# Patient Record
Sex: Male | Born: 1940 | Race: White | Hispanic: No | Marital: Single | Smoking: Never smoker
Health system: Southern US, Community
[De-identification: ages and names within clinical notes are randomized; demographics above are authoritative.]

## PROBLEM LIST (undated history)

## (undated) DIAGNOSIS — IMO0002 Reserved for concepts with insufficient information to code with codable children: Secondary | ICD-10-CM

## (undated) DIAGNOSIS — N400 Enlarged prostate without lower urinary tract symptoms: Secondary | ICD-10-CM

## (undated) DIAGNOSIS — F411 Generalized anxiety disorder: Secondary | ICD-10-CM

## (undated) DIAGNOSIS — I421 Obstructive hypertrophic cardiomyopathy: Secondary | ICD-10-CM

## (undated) DIAGNOSIS — E1165 Type 2 diabetes mellitus with hyperglycemia: Secondary | ICD-10-CM

## (undated) DIAGNOSIS — I10 Essential (primary) hypertension: Secondary | ICD-10-CM

## (undated) DIAGNOSIS — I255 Ischemic cardiomyopathy: Secondary | ICD-10-CM

## (undated) DIAGNOSIS — G629 Polyneuropathy, unspecified: Secondary | ICD-10-CM

## (undated) DIAGNOSIS — E1149 Type 2 diabetes mellitus with other diabetic neurological complication: Secondary | ICD-10-CM

## (undated) DIAGNOSIS — E785 Hyperlipidemia, unspecified: Secondary | ICD-10-CM

## (undated) DIAGNOSIS — I2510 Atherosclerotic heart disease of native coronary artery without angina pectoris: Secondary | ICD-10-CM

## (undated) DIAGNOSIS — E538 Deficiency of other specified B group vitamins: Secondary | ICD-10-CM

## (undated) HISTORY — DX: Obstructive hypertrophic cardiomyopathy: I42.1

## (undated) HISTORY — DX: Essential (primary) hypertension: I10

## (undated) HISTORY — DX: Atherosclerotic heart disease of native coronary artery without angina pectoris: I25.10

## (undated) HISTORY — DX: Type 2 diabetes mellitus with other diabetic neurological complication: E11.49

## (undated) HISTORY — DX: Type 2 diabetes mellitus with hyperglycemia: E11.65

## (undated) HISTORY — DX: Polyneuropathy, unspecified: G62.9

## (undated) HISTORY — DX: Hyperlipidemia, unspecified: E78.5

## (undated) HISTORY — DX: Generalized anxiety disorder: F41.1

## (undated) HISTORY — DX: Reserved for concepts with insufficient information to code with codable children: IMO0002

## (undated) HISTORY — DX: Benign prostatic hyperplasia without lower urinary tract symptoms: N40.0

## (undated) HISTORY — DX: Ischemic cardiomyopathy: I25.5

## (undated) HISTORY — DX: Deficiency of other specified B group vitamins: E53.8

---

## 2008-11-27 ENCOUNTER — Emergency Department (HOSPITAL_COMMUNITY): Admission: EM | Admit: 2008-11-27 | Discharge: 2008-11-27 | Payer: Self-pay | Admitting: Emergency Medicine

## 2010-12-30 LAB — POCT CARDIAC MARKERS
CKMB, poc: 1.7 ng/mL (ref 1.0–8.0)
Myoglobin, poc: 126 ng/mL (ref 12–200)

## 2010-12-30 LAB — COMPREHENSIVE METABOLIC PANEL
ALT: 30 U/L (ref 0–53)
AST: 20 U/L (ref 0–37)
Albumin: 4.4 g/dL (ref 3.5–5.2)
Alkaline Phosphatase: 58 U/L (ref 39–117)
Chloride: 105 mEq/L (ref 96–112)
GFR calc Af Amer: >60 mL/min (ref >60)
Potassium: 3.6 mEq/L (ref 3.5–5.1)
Sodium: 138 mEq/L (ref 135–145)
Total Bilirubin: 0.7 mg/dL (ref 0.3–1.2)

## 2010-12-30 LAB — URINE MICROSCOPIC-ADD ON

## 2010-12-30 LAB — DIFFERENTIAL
Basophils Absolute: 0 10*3/uL (ref 0.0–0.1)
Basophils Relative: 0 % (ref 0–1)
Eosinophils Relative: 0 % (ref 0–5)
Monocytes Absolute: 0.5 10*3/uL (ref 0.1–1.0)
Monocytes Relative: 5 % (ref 3–12)

## 2010-12-30 LAB — URINALYSIS, ROUTINE W REFLEX MICROSCOPIC
Glucose, UA: 250 mg/dL — AB
Hgb urine dipstick: NEGATIVE
Protein, ur: 30 mg/dL — AB
Urobilinogen, UA: 0.2 mg/dL (ref 0.0–1.0)

## 2010-12-30 LAB — CBC
HCT: 45 % (ref 39.0–52.0)
Hemoglobin: 15.5 g/dL (ref 13.0–17.0)
RBC: 5.01 MIL/uL (ref 4.22–5.81)
RDW: 13 % (ref 11.5–15.5)
WBC: 9.5 10*3/uL (ref 4.0–10.5)

## 2010-12-30 LAB — CK TOTAL AND CKMB (NOT AT ARMC): CK, MB: 3.3 ng/mL (ref 0.3–4.0)

## 2019-06-05 ENCOUNTER — Other Ambulatory Visit: Payer: Self-pay

## 2019-06-05 ENCOUNTER — Ambulatory Visit (INDEPENDENT_AMBULATORY_CARE_PROVIDER_SITE_OTHER): Payer: Medicare Other | Admitting: Internal Medicine

## 2019-06-05 ENCOUNTER — Encounter: Payer: Self-pay | Admitting: Internal Medicine

## 2019-06-05 VITALS — BP 140/80 | HR 70 | Temp 98.0°F | Ht 74.0 in | Wt 205.8 lb

## 2019-06-05 DIAGNOSIS — I10 Essential (primary) hypertension: Secondary | ICD-10-CM | POA: Diagnosis not present

## 2019-06-05 DIAGNOSIS — N400 Enlarged prostate without lower urinary tract symptoms: Secondary | ICD-10-CM | POA: Insufficient documentation

## 2019-06-05 DIAGNOSIS — E785 Hyperlipidemia, unspecified: Secondary | ICD-10-CM

## 2019-06-05 DIAGNOSIS — F411 Generalized anxiety disorder: Secondary | ICD-10-CM

## 2019-06-05 DIAGNOSIS — G609 Hereditary and idiopathic neuropathy, unspecified: Secondary | ICD-10-CM

## 2019-06-05 DIAGNOSIS — E1165 Type 2 diabetes mellitus with hyperglycemia: Secondary | ICD-10-CM

## 2019-06-05 DIAGNOSIS — IMO0002 Reserved for concepts with insufficient information to code with codable children: Secondary | ICD-10-CM

## 2019-06-05 DIAGNOSIS — G629 Polyneuropathy, unspecified: Secondary | ICD-10-CM | POA: Insufficient documentation

## 2019-06-05 DIAGNOSIS — E1149 Type 2 diabetes mellitus with other diabetic neurological complication: Secondary | ICD-10-CM | POA: Diagnosis not present

## 2019-06-05 DIAGNOSIS — E119 Type 2 diabetes mellitus without complications: Secondary | ICD-10-CM

## 2019-06-05 DIAGNOSIS — E538 Deficiency of other specified B group vitamins: Secondary | ICD-10-CM | POA: Diagnosis not present

## 2019-06-05 DIAGNOSIS — N401 Enlarged prostate with lower urinary tract symptoms: Secondary | ICD-10-CM

## 2019-06-05 DIAGNOSIS — R351 Nocturia: Secondary | ICD-10-CM

## 2019-06-05 LAB — POCT GLYCOSYLATED HEMOGLOBIN (HGB A1C): Hemoglobin A1C: 9.7 % — AB (ref 4.0–5.6)

## 2019-06-05 MED ORDER — LISINOPRIL-HYDROCHLOROTHIAZIDE 20-25 MG PO TABS: 1.0000 | ORAL_TABLET | Freq: Every day | ORAL | 1 refills | Status: DC

## 2019-06-05 MED ORDER — "BD SAFETYGLIDE SYRINGE/NEEDLE 25G X 1"" 3 ML MISC": 11 refills | Status: DC

## 2019-06-05 MED ORDER — GLIMEPIRIDE 2 MG PO TABS: 2.0000 mg | ORAL_TABLET | Freq: Two times a day (BID) | ORAL | 1 refills | Status: DC

## 2019-06-05 MED ORDER — CYANOCOBALAMIN 1000 MCG/ML IJ SOLN: 1000.0000 ug | INTRAMUSCULAR | 1 refills | Status: DC

## 2019-06-05 MED ORDER — TAMSULOSIN HCL 0.4 MG PO CAPS: 0.4000 mg | ORAL_CAPSULE | Freq: Every day | ORAL | 1 refills | Status: DC

## 2019-06-05 MED ORDER — ATORVASTATIN CALCIUM 40 MG PO TABS: 40.0000 mg | ORAL_TABLET | Freq: Every day | ORAL | 1 refills | Status: DC

## 2019-06-05 MED ORDER — GABAPENTIN 300 MG PO CAPS: 300.0000 mg | ORAL_CAPSULE | Freq: Three times a day (TID) | ORAL | 3 refills | Status: DC

## 2019-06-05 MED ORDER — CYANOCOBALAMIN 1000 MCG/ML IJ SOLN
1000.0000 ug | Freq: Once | INTRAMUSCULAR | Status: AC
  Administered 2019-06-05: 11:00:00 1000 ug via INTRAMUSCULAR

## 2019-06-05 MED ORDER — METFORMIN HCL 1000 MG PO TABS: 1000.0000 mg | ORAL_TABLET | Freq: Two times a day (BID) | ORAL | 1 refills | Status: DC

## 2019-06-05 MED ORDER — PAROXETINE HCL 40 MG PO TABS: 40.0000 mg | ORAL_TABLET | ORAL | 1 refills | Status: DC

## 2019-06-05 NOTE — Patient Instructions (Signed)
-Nice seeing you today!!  -Refills sent today.  -INCREASE metformin to 1000 mg BID.  -Schedule follow up in 3 months for your physical. Please come in fasting that day.   Diabetes Mellitus and Nutrition, Adult When you have diabetes (diabetes mellitus), it is very important to have healthy eating habits because your blood sugar (glucose) levels are greatly affected by what you eat and drink. Eating healthy foods in the appropriate amounts, at about the same times every day, can help you:  Control your blood glucose.  Lower your risk of heart disease.  Improve your blood pressure.  Reach or maintain a healthy weight. Every person with diabetes is different, and each person has different needs for a meal plan. Your health care provider may recommend that you work with a diet and nutrition specialist (dietitian) to make a meal plan that is best for you. Your meal plan may vary depending on factors such as:  The calories you need.  The medicines you take.  Your weight.  Your blood glucose, blood pressure, and cholesterol levels.  Your activity level.  Other health conditions you have, such as heart or kidney disease. How do carbohydrates affect me? Carbohydrates, also called carbs, affect your blood glucose level more than any other type of food. Eating carbs naturally raises the amount of glucose in your blood. Carb counting is a method for keeping track of how many carbs you eat. Counting carbs is important to keep your blood glucose at a healthy level, especially if you use insulin or take certain oral diabetes medicines. It is important to know how many carbs you can safely have in each meal. This is different for every person. Your dietitian can help you calculate how many carbs you should have at each meal and for each snack. Foods that contain carbs include:  Bread, cereal, rice, pasta, and crackers.  Potatoes and corn.  Peas, beans, and lentils.  Milk and yogurt.   Fruit and juice.  Desserts, such as cakes, cookies, ice cream, and candy. How does alcohol affect me? Alcohol can cause a sudden decrease in blood glucose (hypoglycemia), especially if you use insulin or take certain oral diabetes medicines. Hypoglycemia can be a life-threatening condition. Symptoms of hypoglycemia (sleepiness, dizziness, and confusion) are similar to symptoms of having too much alcohol. If your health care provider says that alcohol is safe for you, follow these guidelines:  Limit alcohol intake to no more than 1 drink per day for nonpregnant women and 2 drinks per day for men. One drink equals 12 oz of beer, 5 oz of wine, or 1 oz of hard liquor.  Do not drink on an empty stomach.  Keep yourself hydrated with water, diet soda, or unsweetened iced tea.  Keep in mind that regular soda, juice, and other mixers may contain a lot of sugar and must be counted as carbs. What are tips for following this plan?  Reading food labels  Start by checking the serving size on the "Nutrition Facts" label of packaged foods and drinks. The amount of calories, carbs, fats, and other nutrients listed on the label is based on one serving of the item. Many items contain more than one serving per package.  Check the total grams (g) of carbs in one serving. You can calculate the number of servings of carbs in one serving by dividing the total carbs by 15. For example, if a food has 30 g of total carbs, it would be equal to 2 servings  of carbs.  Check the number of grams (g) of saturated and trans fats in one serving. Choose foods that have low or no amount of these fats.  Check the number of milligrams (mg) of salt (sodium) in one serving. Most people should limit total sodium intake to less than 2,300 mg per day.  Always check the nutrition information of foods labeled as "low-fat" or "nonfat". These foods may be higher in added sugar or refined carbs and should be avoided.  Talk to your  dietitian to identify your daily goals for nutrients listed on the label. Shopping  Avoid buying canned, premade, or processed foods. These foods tend to be high in fat, sodium, and added sugar.  Shop around the outside edge of the grocery store. This includes fresh fruits and vegetables, bulk grains, fresh meats, and fresh dairy. Cooking  Use low-heat cooking methods, such as baking, instead of high-heat cooking methods like deep frying.  Cook using healthy oils, such as olive, canola, or sunflower oil.  Avoid cooking with butter, cream, or high-fat meats. Meal planning  Eat meals and snacks regularly, preferably at the same times every day. Avoid going long periods of time without eating.  Eat foods high in fiber, such as fresh fruits, vegetables, beans, and whole grains. Talk to your dietitian about how many servings of carbs you can eat at each meal.  Eat 4-6 ounces (oz) of lean protein each day, such as lean meat, chicken, fish, eggs, or tofu. One oz of lean protein is equal to: ? 1 oz of meat, chicken, or fish. ? 1 egg. ?  cup of tofu.  Eat some foods each day that contain healthy fats, such as avocado, nuts, seeds, and fish. Lifestyle  Check your blood glucose regularly.  Exercise regularly as told by your health care provider. This may include: ? 150 minutes of moderate-intensity or vigorous-intensity exercise each week. This could be brisk walking, biking, or water aerobics. ? Stretching and doing strength exercises, such as yoga or weightlifting, at least 2 times a week.  Take medicines as told by your health care provider.  Do not use any products that contain nicotine or tobacco, such as cigarettes and e-cigarettes. If you need help quitting, ask your health care provider.  Work with a Social worker or diabetes educator to identify strategies to manage stress and any emotional and social challenges. Questions to ask a health care provider  Do I need to meet with a  diabetes educator?  Do I need to meet with a dietitian?  What number can I call if I have questions?  When are the best times to check my blood glucose? Where to find more information:  American Diabetes Association: diabetes.org  Academy of Nutrition and Dietetics: www.eatright.[employer] of Diabetes and Digestive and Kidney Diseases (NIH): [employer] Summary  A healthy meal plan will help you control your blood glucose and maintain a healthy lifestyle.  Working with a diet and nutrition specialist (dietitian) can help you make a meal plan that is best for you.  Keep in mind that carbohydrates (carbs) and alcohol have immediate effects on your blood glucose levels. It is important to count carbs and to use alcohol carefully. This information is not intended to replace advice given to you by your health care provider. Make sure you discuss any questions you have with your health care provider. Document Released: 06/02/2005 Document Revised: 08/18/2017 Document Reviewed: 10/10/2016 Elsevier Patient Education  2020 [employer].

## 2019-06-05 NOTE — Progress Notes (Signed)
New Patient Office Visit     CC/Reason for Visit: Establish care, discuss chronic conditions, medication refills [facility] [place] [facility] [place]: [facility]  HPI: Ryan [name] is a 78 y.o. male who is coming in today for the above mentioned reasons. 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  [place], has 2 children, his middle son was killed in a car accident in 2012.  He is a never smoker, does not drink alcohol.  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  Used to be a Landscape architectpro tennis player.  His family history is only significant for mother with diabetes, his daughter has type 1 diabetes.  He ran out of all of his medications about a month ago.   Past Medical/Surgical History: Past Medical History:  Diagnosis Date  . B12 deficiency   . BPH (benign prostatic hyperplasia)   . DM (diabetes mellitus), type 2, uncontrolled w/neurologic complication (HCC)   . GAD (generalized anxiety disorder)   . HTN (hypertension)   . Hyperlipidemia   . Neuropathy, peripheral     History reviewed. No pertinent surgical history.  Social History:  reports that he has never smoked. He has never used smokeless tobacco. He reports that he does not drink alcohol or use drugs.  Allergies: Not on File  Family History:  Family History  Problem Relation Age of Onset  . Diabetes Mother      Current Outpatient Medications:  .  atorvastatin (LIPITOR) 40 MG tablet, Take 1 tablet (40 mg total) by mouth at bedtime., Disp: 90 tablet, Rfl: 1 .  gabapentin (NEURONTIN) 300 MG capsule, Take 1 capsule (300 mg total) by mouth 3 (three) times daily., Disp: 90 capsule, Rfl: 3 .  glimepiride (AMARYL) 2 MG tablet, Take 1 tablet (2 mg total) by mouth 2 (two) times daily., Disp: 180 tablet,  Rfl: 1 .  lisinopril-hydrochlorothiazide (ZESTORETIC) 20-25 MG tablet, Take 1 tablet by mouth daily. Take one tab daily, Disp: 90 tablet, Rfl: 1 .  metFORMIN (GLUCOPHAGE) 1000 MG tablet, Take 1 tablet (1,000 mg total) by mouth 2 (two) times daily with a meal. Take one tab twice daily, Disp: 180 tablet, Rfl: 1 .  PARoxetine (PAXIL) 40 MG tablet, Take 1 tablet (40 mg total) by mouth every morning., Disp: 90 tablet, Rfl: 1 .  tamsulosin (FLOMAX) 0.4 MG CAPS capsule, Take 1 capsule (0.4 mg total) by mouth daily., Disp: 90 capsule, Rfl: 1  Review of Systems:  Constitutional: Denies fever, chills, diaphoresis, appetite change and fatigue.  HEENT: Denies photophobia, eye pain, redness, hearing loss, ear pain, congestion, sore throat, rhinorrhea, sneezing, mouth sores, trouble swallowing, neck pain, neck stiffness and tinnitus.   Respiratory: Denies SOB, DOE, cough, chest tightness,  and wheezing.   Cardiovascular: Denies chest pain, palpitations and leg swelling.  Gastrointestinal: Denies nausea, vomiting, abdominal pain, diarrhea, constipation, blood in stool and abdominal distention.  Genitourinary: Denies dysuria, urgency, frequency, hematuria, flank pain and difficulty urinating.  Endocrine: Denies: hot or cold intolerance, sweats, changes in hair or nails, polyuria, polydipsia. Musculoskeletal: Denies myalgias, back pain, joint swelling, arthralgias and gait problem.  Skin: Denies pallor, rash and wound.  Neurological: Denies dizziness, seizures, syncope, weakness, light-headedness, numbness and headaches.  Hematological: Denies adenopathy. Easy bruising, personal or family bleeding history  Psychiatric/Behavioral: Denies  suicidal ideation, mood changes, confusion, nervousness, sleep disturbance and agitation    Physical Exam: Vitals:   06/05/19 0955  BP: 140/80  Pulse: 70  Temp: 98 F (36.7 C)  TempSrc: Temporal  SpO2: 99%  Weight: 205 lb 12.8 oz (93.4 kg)  Height: 6\' 2"  (1.88 m)    Body mass index is 26.42 kg/m.  Constitutional: NAD, calm, comfortable Eyes: PERRL, lids and conjunctivae normal ENMT: Mucous membranes are moist. Respiratory: clear to auscultation bilaterally, no wheezing, no crackles. Normal respiratory effort. No accessory muscle use.  Cardiovascular: Regular rate and rhythm, no murmurs / rubs / gallops. No extremity edema. 2+ pedal pulses. No carotid bruits.  Abdomen: no tenderness, no masses palpated. No hepatosplenomegaly. Bowel sounds positive.  Musculoskeletal: no clubbing / cyanosis. No joint deformity upper and lower extremities. Good ROM, no contractures. Normal muscle tone.  Skin: no rashes, lesions, ulcers. No induration Neurologic: Grossly intact and nonfocal Psychiatric: Normal judgment and insight. Alert and oriented x 3. Normal mood.    Impression and Plan:  Type 2 diabetes mellitus without complication, without long-term current use of insulin (HCC)  -Uncontrolled with an A1c of 9.7 today. -He has been taking metformin 500 mg twice daily, will increase to 1000 mg twice daily. -Follow-up in 3 months. -He is also on Amaryl  Essential hypertension -Not well controlled but has been off medications for about 1 month, resume lisinopril/hydrochlorothiazide. -Follow-up in 3 months.  Idiopathic peripheral neuropathy -Refill Neurontin 300 mg of which she takes 2 tablets at bedtime.  Benign prostatic hyperplasia with nocturia -Needs refills of Flomax.  B12 deficiency -He is on monthly IM supplementation  GAD (generalized anxiety disorder) -Mood is stable on Paxil, he started taking this after the tragic death of his son.  Hyperlipidemia, unspecified hyperlipidemia type -Not on a statin, check lipids when he returns for physical    Patient Instructions  -Nice seeing you today!!  -Refills sent today.  -INCREASE metformin to 1000 mg BID.  -Schedule follow up in 3 months for your physical. Please come in fasting that day.    Diabetes Mellitus and Nutrition, Adult When you have diabetes (diabetes mellitus), it is very important to have healthy eating habits because your blood sugar (glucose) levels are greatly affected by what you eat and drink. Eating healthy foods in the appropriate amounts, at about the same times every day, can help you:  Control your blood glucose.  Lower your risk of heart disease.  Improve your blood pressure.  Reach or maintain a healthy weight. Every person with diabetes is different, and each person has different needs for a meal plan. Your health care provider may recommend that you work with a diet and nutrition specialist (dietitian) to make a meal plan that is best for you. Your meal plan may vary depending on factors such as:  The calories you need.  The medicines you take.  Your weight.  Your blood glucose, blood pressure, and cholesterol levels.  Your activity level.  Other health conditions you have, such as heart or kidney disease. How do carbohydrates affect me? Carbohydrates, also called carbs, affect your blood glucose level more than any other type of food. Eating carbs naturally raises the amount of glucose in your blood. Carb counting is a method for keeping track of how many carbs you eat. Counting carbs is important to keep your blood glucose at a healthy level, especially if you use insulin or take certain oral diabetes medicines. It is important to know how many  carbs you can safely have in each meal. This is different for every person. Your dietitian can help you calculate how many carbs you should have at each meal and for each snack. Foods that contain carbs include:  Bread, cereal, rice, pasta, and crackers.  Potatoes and corn.  Peas, beans, and lentils.  Milk and yogurt.  Fruit and juice.  Desserts, such as cakes, cookies, ice cream, and candy. How does alcohol affect me? Alcohol can cause a sudden decrease in blood glucose (hypoglycemia),  especially if you use insulin or take certain oral diabetes medicines. Hypoglycemia can be a life-threatening condition. Symptoms of hypoglycemia (sleepiness, dizziness, and confusion) are similar to symptoms of having too much alcohol. If your health care provider says that alcohol is safe for you, follow these guidelines:  Limit alcohol intake to no more than 1 drink per day for nonpregnant women and 2 drinks per day for men. One drink equals 12 oz of beer, 5 oz of wine, or 1 oz of hard liquor.  Do not drink on an empty stomach.  Keep yourself hydrated with water, diet soda, or unsweetened iced tea.  Keep in mind that regular soda, juice, and other mixers may contain a lot of sugar and must be counted as carbs. What are tips for following this plan?  Reading food labels  Start by checking the serving size on the "Nutrition Facts" label of packaged foods and drinks. The amount of calories, carbs, fats, and other nutrients listed on the label is based on one serving of the item. Many items contain more than one serving per package.  Check the total grams (g) of carbs in one serving. You can calculate the number of servings of carbs in one serving by dividing the total carbs by 15. For example, if a food has 30 g of total carbs, it would be equal to 2 servings of carbs.  Check the number of grams (g) of saturated and trans fats in one serving. Choose foods that have low or no amount of these fats.  Check the number of milligrams (mg) of salt (sodium) in one serving. Most people should limit total sodium intake to less than 2,300 mg per day.  Always check the nutrition information of foods labeled as "low-fat" or "nonfat". These foods may be higher in added sugar or refined carbs and should be avoided.  Talk to your dietitian to identify your daily goals for nutrients listed on the label. Shopping  Avoid buying canned, premade, or processed foods. These foods tend to be high in fat, sodium,  and added sugar.  Shop around the outside edge of the grocery store. This includes fresh fruits and vegetables, bulk grains, fresh meats, and fresh dairy. Cooking  Use low-heat cooking methods, such as baking, instead of high-heat cooking methods like deep frying.  Cook using healthy oils, such as olive, canola, or sunflower oil.  Avoid cooking with butter, cream, or high-fat meats. Meal planning  Eat meals and snacks regularly, preferably at the same times every day. Avoid going long periods of time without eating.  Eat foods high in fiber, such as fresh fruits, vegetables, beans, and whole grains. Talk to your dietitian about how many servings of carbs you can eat at each meal.  Eat 4-6 ounces (oz) of lean protein each day, such as lean meat, chicken, fish, eggs, or tofu. One oz of lean protein is equal to: ? 1 oz of meat, chicken, or fish. ? 1 egg. ?  cup of tofu.  Eat some foods each day that contain healthy fats, such as avocado, nuts, seeds, and fish. Lifestyle  Check your blood glucose regularly.  Exercise regularly as told by your health care provider. This may include: ? 150 minutes of moderate-intensity or vigorous-intensity exercise each week. This could be brisk walking, biking, or water aerobics. ? Stretching and doing strength exercises, such as yoga or weightlifting, at least 2 times a week.  Take medicines as told by your health care provider.  Do not use any products that contain nicotine or tobacco, such as cigarettes and e-cigarettes. If you need help quitting, ask your health care provider.  Work with a Veterinary surgeoncounselor or diabetes educator to identify strategies to manage stress and any emotional and social challenges. Questions to ask a health care provider  Do I need to meet with a diabetes educator?  Do I need to meet with a dietitian?  What number can I call if I have questions?  When are the best times to check my blood glucose? Where to find more  information:  American Diabetes Association: diabetes.org  Academy of Nutrition and Dietetics: www.eatright.[employer]  National Institute of Diabetes and Digestive and Kidney Diseases (NIH): [employer].tnwww.niddk.nih.gov Summary  A healthy meal plan will help you control your blood glucose and maintain a healthy lifestyle.  Working with a diet and nutrition specialist (dietitian) can help you make a meal plan that is best for you.  Keep in mind that carbohydrates (carbs) and alcohol have immediate effects on your blood glucose levels. It is important to count carbs and to use alcohol carefully. This information is not intended to replace advice given to you by your health care provider. Make sure you discuss any questions you have with your health care provider. Document Released: 06/02/2005 Document Revised: 08/18/2017 Document Reviewed: 10/10/2016 Elsevier Patient Education  2020 Elsevier Inc.      Chaya JanEstela Hernandez Acosta, MD [facility]

## 2019-06-05 NOTE — Addendum Note (Signed)
Addended by: Westley Hummer B on: 06/05/2019 10:51 AM   Modules accepted: Orders

## 2019-10-08 ENCOUNTER — Encounter: Payer: Self-pay | Admitting: Internal Medicine

## 2019-12-17 ENCOUNTER — Other Ambulatory Visit: Payer: Self-pay | Admitting: Internal Medicine

## 2019-12-17 DIAGNOSIS — I10 Essential (primary) hypertension: Secondary | ICD-10-CM

## 2019-12-17 DIAGNOSIS — G609 Hereditary and idiopathic neuropathy, unspecified: Secondary | ICD-10-CM

## 2019-12-17 DIAGNOSIS — E119 Type 2 diabetes mellitus without complications: Secondary | ICD-10-CM

## 2019-12-25 ENCOUNTER — Telehealth: Payer: Self-pay | Admitting: Internal Medicine

## 2019-12-25 DIAGNOSIS — M25562 Pain in left knee: Secondary | ICD-10-CM

## 2019-12-25 NOTE — Telephone Encounter (Signed)
Left message on machine for patient to return our call 

## 2019-12-25 NOTE — Telephone Encounter (Signed)
We do not do knee injections here. Please refer to sports med or ortho per his preference.

## 2019-12-25 NOTE — Telephone Encounter (Signed)
Pt is calling in stating that he would like to come in and get a cortisone injection in his L knee sometime this week is he could come and do it as a nurse visit.  Pt is aware that Dr. Ardyth Harps does not have anything tomorrow or Friday but virtual appointments.  Pt would like to have a call back to let him know if this can be done without seeing the provider.

## 2019-12-31 NOTE — Telephone Encounter (Signed)
Patient is aware and a referral to sports med placed.

## 2019-12-31 NOTE — Addendum Note (Signed)
Addended by: Kern Reap B on: 12/31/2019 05:00 PM   Modules accepted: Orders

## 2019-12-31 NOTE — Telephone Encounter (Signed)
2nd attempt. Left message on machine for patient to return our call. 

## 2020-01-06 ENCOUNTER — Other Ambulatory Visit: Payer: Self-pay

## 2020-01-07 ENCOUNTER — Encounter: Payer: Self-pay | Admitting: Internal Medicine

## 2020-01-07 ENCOUNTER — Other Ambulatory Visit: Payer: Self-pay | Admitting: Internal Medicine

## 2020-01-07 ENCOUNTER — Ambulatory Visit (INDEPENDENT_AMBULATORY_CARE_PROVIDER_SITE_OTHER): Payer: Medicare Other | Admitting: Internal Medicine

## 2020-01-07 VITALS — BP 134/80 | HR 78 | Temp 97.4°F | Ht 73.0 in | Wt 198.9 lb

## 2020-01-07 DIAGNOSIS — F411 Generalized anxiety disorder: Secondary | ICD-10-CM

## 2020-01-07 DIAGNOSIS — E538 Deficiency of other specified B group vitamins: Secondary | ICD-10-CM | POA: Diagnosis not present

## 2020-01-07 DIAGNOSIS — Z0000 Encounter for general adult medical examination without abnormal findings: Secondary | ICD-10-CM

## 2020-01-07 DIAGNOSIS — IMO0002 Reserved for concepts with insufficient information to code with codable children: Secondary | ICD-10-CM

## 2020-01-07 DIAGNOSIS — E785 Hyperlipidemia, unspecified: Secondary | ICD-10-CM

## 2020-01-07 DIAGNOSIS — E559 Vitamin D deficiency, unspecified: Secondary | ICD-10-CM

## 2020-01-07 DIAGNOSIS — E1165 Type 2 diabetes mellitus with hyperglycemia: Secondary | ICD-10-CM

## 2020-01-07 DIAGNOSIS — I10 Essential (primary) hypertension: Secondary | ICD-10-CM

## 2020-01-07 DIAGNOSIS — E1149 Type 2 diabetes mellitus with other diabetic neurological complication: Secondary | ICD-10-CM | POA: Diagnosis not present

## 2020-01-07 DIAGNOSIS — G4700 Insomnia, unspecified: Secondary | ICD-10-CM

## 2020-01-07 DIAGNOSIS — Z125 Encounter for screening for malignant neoplasm of prostate: Secondary | ICD-10-CM | POA: Diagnosis not present

## 2020-01-07 DIAGNOSIS — M25562 Pain in left knee: Secondary | ICD-10-CM

## 2020-01-07 LAB — CBC WITH DIFFERENTIAL/PLATELET
Basophils Absolute: 0.1 10*3/uL (ref 0.0–0.1)
Basophils Relative: 1.2 % (ref 0.0–3.0)
Eosinophils Absolute: 0.1 10*3/uL (ref 0.0–0.7)
Eosinophils Relative: 1.8 % (ref 0.0–5.0)
HCT: 40.1 % (ref 39.0–52.0)
Hemoglobin: 13.9 g/dL (ref 13.0–17.0)
Lymphocytes Relative: 23.3 % (ref 12.0–46.0)
Lymphs Abs: 1.5 10*3/uL (ref 0.7–4.0)
MCHC: 34.6 g/dL (ref 30.0–36.0)
MCV: 87 fl (ref 78.0–100.0)
Monocytes Absolute: 0.5 10*3/uL (ref 0.1–1.0)
Monocytes Relative: 8.4 % (ref 3.0–12.0)
Neutro Abs: 4.1 10*3/uL (ref 1.4–7.7)
Neutrophils Relative %: 65.3 % (ref 43.0–77.0)
Platelets: 244 10*3/uL (ref 150.0–400.0)
RBC: 4.61 Mil/uL (ref 4.22–5.81)
RDW: 13.9 % (ref 11.5–15.5)
WBC: 6.3 10*3/uL (ref 4.0–10.5)

## 2020-01-07 LAB — COMPREHENSIVE METABOLIC PANEL
ALT: 16 U/L (ref 0–53)
AST: 13 U/L (ref 0–37)
Albumin: 4.3 g/dL (ref 3.5–5.2)
Alkaline Phosphatase: 71 U/L (ref 39–117)
BUN: 14 mg/dL (ref 6–23)
CO2: 29 mEq/L (ref 19–32)
Calcium: 9.1 mg/dL (ref 8.4–10.5)
Chloride: 98 mEq/L (ref 96–112)
Creatinine, Ser: 1 mg/dL (ref 0.40–1.50)
GFR: 72.56 mL/min (ref >60.00)
Glucose, Bld: 325 mg/dL — ABNORMAL HIGH (ref 70–99)
Potassium: 3.6 mEq/L (ref 3.5–5.1)
Sodium: 135 mEq/L (ref 135–145)
Total Bilirubin: 0.6 mg/dL (ref 0.2–1.2)
Total Protein: 6.4 g/dL (ref 6.0–8.3)

## 2020-01-07 LAB — LIPID PANEL
Cholesterol: 129 mg/dL (ref 0–200)
HDL: 30 mg/dL — ABNORMAL LOW (ref >39.00)
NonHDL: 98.53
Total CHOL/HDL Ratio: 4
Triglycerides: 284 mg/dL — ABNORMAL HIGH (ref 0.0–149.0)
VLDL: 56.8 mg/dL — ABNORMAL HIGH (ref 0.0–40.0)

## 2020-01-07 LAB — VITAMIN D 25 HYDROXY (VIT D DEFICIENCY, FRACTURES): VITD: 14.54 ng/mL — ABNORMAL LOW (ref 30.00–100.00)

## 2020-01-07 LAB — VITAMIN B12: Vitamin B-12: >1500 pg/mL — ABNORMAL HIGH (ref 211–911)

## 2020-01-07 LAB — LDL CHOLESTEROL, DIRECT: Direct LDL: 56 mg/dL

## 2020-01-07 LAB — PSA: PSA: 1.17 ng/mL (ref 0.10–4.00)

## 2020-01-07 LAB — TSH: TSH: 1.46 u[IU]/mL (ref 0.35–4.50)

## 2020-01-07 LAB — HEMOGLOBIN A1C: Hgb A1c MFr Bld: 10.8 % — ABNORMAL HIGH (ref 4.6–6.5)

## 2020-01-07 MED ORDER — ZOLPIDEM TARTRATE 5 MG PO TABS: 5.0000 mg | ORAL_TABLET | Freq: Every evening | ORAL | 1 refills | Status: DC | PRN

## 2020-01-07 MED ORDER — TRULICITY 0.75 MG/0.5ML ~~LOC~~ SOAJ: 0.7500 mg | SUBCUTANEOUS | 3 refills | Status: DC

## 2020-01-07 MED ORDER — TRAMADOL HCL 50 MG PO TABS: 50.0000 mg | ORAL_TABLET | Freq: Two times a day (BID) | ORAL | 0 refills | Status: DC | PRN

## 2020-01-07 MED ORDER — CYANOCOBALAMIN 1000 MCG/ML IJ SOLN: INTRAMUSCULAR | 3 refills | Status: DC

## 2020-01-07 MED ORDER — "BD SAFETYGLIDE SYRINGE/NEEDLE 25G X 1"" 3 ML MISC": 11 refills | Status: DC

## 2020-01-07 MED ORDER — GLIMEPIRIDE 2 MG PO TABS: 2.0000 mg | ORAL_TABLET | Freq: Two times a day (BID) | ORAL | 1 refills | Status: DC

## 2020-01-07 MED ORDER — CYANOCOBALAMIN 1000 MCG/ML IJ SOLN
1000.0000 ug | Freq: Once | INTRAMUSCULAR | Status: AC
  Administered 2020-01-07: 1000 ug via INTRAMUSCULAR

## 2020-01-07 MED ORDER — VITAMIN D (ERGOCALCIFEROL) 1.25 MG (50000 UNIT) PO CAPS: 50000.0000 [IU] | ORAL_CAPSULE | ORAL | 0 refills | Status: AC

## 2020-01-07 NOTE — Addendum Note (Signed)
Addended by: Kern Reap B on: 01/07/2020 03:36 PM   Modules accepted: Orders

## 2020-01-07 NOTE — Progress Notes (Signed)
Established Patient Office Visit     This visit occurred during the SARS-CoV-2 public health emergency.  Safety protocols were in place, including screening questions prior to the visit, additional usage of staff PPE, and extensive cleaning of exam room while observing appropriate contact time as indicated for disinfecting solutions.    CC/Reason for Visit: Annual preventive exam, subsequent Medicare wellness visit, discuss some acute concerns  HPI: Ryan [name] is a 79 y.o. male who is coming in today for the above mentioned reasons. 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  Since I last saw him he has had his first Covid vaccine and has appointment scheduled for his second 1.  He has developed some left knee pain due to his foot and ankle issues.  He has an appointment with sports medicine for next week.  He is wondering if there is anything he can have for pain and sleeping due to severe knee pain.  He is due for B12 injection and he will like to get one in office today.  He has routine dental care but no eye care.  He had a colonoscopy 3 years ago.   Past Medical/Surgical History: Past Medical History:  Diagnosis Date  . B12 deficiency   . BPH (benign prostatic hyperplasia)   . DM (diabetes mellitus), type 2, uncontrolled w/neurologic complication ([place])   . GAD (generalized anxiety disorder)   . HTN (hypertension)   . Hyperlipidemia   . Neuropathy, peripheral     No past surgical history on file.  Social History:  reports that he has never smoked. He has never used smokeless tobacco. He reports that he does not drink alcohol or use drugs.  Allergies: No Known Allergies  Family History:  Family History  Problem Relation Age of Onset  . Diabetes Mother      Current Outpatient Medications:  .   atorvastatin (LIPITOR) 40 MG tablet, Take 1 tablet (40 mg total) by mouth at bedtime., Disp: 90 tablet, Rfl: 1 .  cyanocobalamin (,VITAMIN B-12,) 1000 MCG/ML injection, Inject 1 mL (1,000 mcg total) into the muscle every 30 (thirty) days., Disp: 6 mL, Rfl: 1 .  gabapentin (NEURONTIN) 300 MG capsule, TAKE ONE CAPSULE BY MOUTH THREE TIMES A DAY, Disp: 270 capsule, Rfl: 2 .  glimepiride (AMARYL) 2 MG tablet, Take 1 tablet (2 mg total) by mouth 2 (two) times daily., Disp: 180 tablet, Rfl: 1 .  lisinopril-hydrochlorothiazide (ZESTORETIC) 20-25 MG tablet, TAKE ONE TABLET BY MOUTH DAILY, Disp: 90 tablet, Rfl: 0 .  metFORMIN (GLUCOPHAGE) 1000 MG tablet, TAKE ONE TABLET BY MOUTH TWICE A DAY WITH A MEAL, Disp: 180 tablet, Rfl: 0 .  PARoxetine (PAXIL) 40 MG tablet, Take 1 tablet (40 mg total) by mouth every morning., Disp: 90 tablet, Rfl: 1 .  SYRINGE-NEEDLE, DISP, 3 ML (BD SAFETYGLIDE SYRINGE/NEEDLE) 25G X 1" 3 ML MISC, Use for B12 injections, Disp: 100 each, Rfl: 11 .  tamsulosin (FLOMAX) 0.4 MG CAPS capsule, Take 1 capsule (0.4 mg total) by mouth daily., Disp: 90 capsule, Rfl: 1 .  cyanocobalamin (,VITAMIN B-12,) 1000 MCG/ML injection, inject 1 ml once a month, Disp: 6 mL, Rfl: 3 .  traMADol (ULTRAM) 50 MG tablet, Take 1 tablet (50 mg total) by mouth every 12 (twelve) hours as needed for moderate pain., Disp: 45 tablet, Rfl: 0 .  zolpidem (AMBIEN)  5 MG tablet, Take 1 tablet (5 mg total) by mouth at bedtime as needed for sleep., Disp: 30 tablet, Rfl: 1  Review of Systems:  Constitutional: Denies fever, chills, diaphoresis, appetite change and fatigue.  HEENT: Denies photophobia, eye pain, redness, hearing loss, ear pain, congestion, sore throat, rhinorrhea, sneezing, mouth sores, trouble swallowing, neck pain, neck stiffness and tinnitus.   Respiratory: Denies SOB, DOE, cough, chest tightness,  and wheezing.   Cardiovascular: Denies chest pain, palpitations and leg swelling.  Gastrointestinal: Denies  nausea, vomiting, abdominal pain, diarrhea, constipation, blood in stool and abdominal distention.  Genitourinary: Denies dysuria, urgency, frequency, hematuria, flank pain and difficulty urinating.  Endocrine: Denies: hot or cold intolerance, sweats, changes in hair or nails, polyuria, polydipsia. Musculoskeletal: Denies back pain, joint swelling. Skin: Denies pallor, rash and wound.  Neurological: Denies dizziness, seizures, syncope, weakness, light-headedness, numbness and headaches.  Hematological: Denies adenopathy. Easy bruising, personal or family bleeding history  Psychiatric/Behavioral: Denies suicidal ideation, mood changes, confusion, nervousness, sleep disturbance and agitation    Physical Exam: Vitals:   01/07/20 0933  BP: 134/80  Pulse: 78  Temp: (!) 97.4 F (36.3 C)  TempSrc: Temporal  SpO2: 98%  Weight: 198 lb 14.4 oz (90.2 kg)  Height: '6\' 1"'  (1.854 m)    Body mass index is 26.24 kg/m.   Constitutional: NAD, calm, comfortable Eyes: PERRL, lids and conjunctivae normal ENMT: Mucous membranes are moist. Tympanic membrane is pearly white, no erythema or bulging. Neck: normal, supple, no masses, no thyromegaly Respiratory: clear to auscultation bilaterally, no wheezing, no crackles. Normal respiratory effort. No accessory muscle use.  Cardiovascular: Regular rate and rhythm, no murmurs / rubs / gallops. No extremity edema. 2+ pedal pulses. No carotid bruits.  Abdomen: no tenderness, no masses palpated. No hepatosplenomegaly. Bowel sounds positive.  Musculoskeletal: no clubbing / cyanosis. No joint deformity upper and lower extremities. Good ROM, no contractures. Normal muscle tone.  Skin: no rashes, lesions, ulcers. No induration Neurologic: CN 2-12 grossly intact. Sensation intact, DTR normal. Strength 5/5 in all 4.  Psychiatric: Normal judgment and insight. Alert and oriented x 3. Normal mood.   Subsequent Medicare wellness visit   1. Risk factors, based on past   M,S,F -cardiovascular disease risk factors include age, gender, history of hypertension, history of hyperlipidemia, history of type 2 diabetes   2.  Physical activities: Remains physically active, used to be a Airline pilot   3.  Depression/mood:  Stable, not depressed   4.  Hearing:  No perceived issues   5.  ADL's: Independent in all ADLs   6.  Fall risk:  Low to moderate given significant left knee pain   7.  Home safety: No problems identified   8.  Height weight, and visual acuity: Height and weight as above, visual acuity is 20/32 with each eye independently and 20/25 with eyes together   9.  Counseling:  Advised ophthalmology consultation given his diabetes   10. Lab orders based on risk factors: Laboratory update will be reviewed   11. Referral :  Ophthalmology   12. Care plan:  Follow-up with me in 3 months   13. Cognitive assessment:  No cognitive impairment   14. Screening: Patient provided with a written and personalized 5-10 year screening schedule in the AVS.   yes   15. Provider List Update:   PCP only  16. Advance Directives: Full code     Office Visit from 06/05/2019 in [place] at [facility] Total Score  0      Fall Risk  01/07/2020 06/05/2019  Falls in the past year? 0 -  Number falls in past yr: 0 0  Injury with Fall? 0 0     Impression and Plan:  Encounter for preventive health examination -He has routine dental care, have advised routine eye care. -Received his first Covid vaccine, he is also due for tetanus, Pneumovax and shingles but will wait 6 weeks after second Covid vaccine to administer. -Healthy lifestyle discussed in detail. -Screening labs today. -Had a colonoscopy 3 years ago and is a 5-year callback due to personal history of polyps. -Check PSA today.  DM (diabetes mellitus), type 2, uncontrolled w/neurologic complication ([place])  -Uncontrolled with an A1c of 9.7 in September 2020. -Recheck A1c  today. -He remains on Metformin and Amaryl.  Essential hypertension -Well-controlled on current medications.  Hyperlipidemia, unspecified hyperlipidemia type -Check LDL today, goal less than 70.  GAD (generalized anxiety disorder) -Well-controlled on paroxetine  B12 deficiency  - Plan: cyanocobalamin (,VITAMIN B-12,) 1000 MCG/ML injection, SYRINGE-NEEDLE, DISP, 3 ML (BD SAFETYGLIDE SYRINGE/NEEDLE) 25G X 1" 3 ML MISC  Acute pain of left knee  - Plan: traMADol (ULTRAM) 50 MG tablet  Insomnia, unspecified type  - Plan: traMADol (ULTRAM) 50 MG tablet    Patient Instructions  -Nice seeing you today!!  -Lab work today; will notify you once results are available.  -B12 injection today.  -Take ambien 5 mg at bedtime as needed for sleep.  -Tale tramadol 50 mg 1 tablet every 12 hours as needed for pain.  -Schedule follow up in 3 months.   Preventive Care 84 Years and Older, Male Preventive care refers to lifestyle choices and visits with your health care provider that can promote health and wellness. This includes:  A yearly physical exam. This is also called an annual well check.  Regular dental and eye exams.  Immunizations.  Screening for certain conditions.  Healthy lifestyle choices, such as diet and exercise. What can I expect for my preventive care visit? Physical exam Your health care provider will check:  Height and weight. These may be used to calculate body mass index (BMI), which is a measurement that tells if you are at a healthy weight.  Heart rate and blood pressure.  Your skin for abnormal spots. Counseling Your health care provider may ask you questions about:  Alcohol, tobacco, and drug use.  Emotional well-being.  Home and relationship well-being.  Sexual activity.  Eating habits.  History of falls.  Memory and ability to understand (cognition).  Work and work Statistician. What immunizations do I need?  Influenza (flu)  vaccine  This is recommended every year. Tetanus, diphtheria, and pertussis (Tdap) vaccine  You may need a Td booster every 10 years. Varicella (chickenpox) vaccine  You may need this vaccine if you have not already been vaccinated. Zoster (shingles) vaccine  You may need this after age 44. Pneumococcal conjugate (PCV13) vaccine  One dose is recommended after age 57. Pneumococcal polysaccharide (PPSV23) vaccine  One dose is recommended after age 7. Measles, mumps, and rubella (MMR) vaccine  You may need at least one dose of MMR if you were born in 1957 or later. You may also need a second dose. Meningococcal conjugate (MenACWY) vaccine  You may need this if you have certain conditions. Hepatitis A vaccine  You may need this if you have certain conditions or if you travel or work in places where you may be exposed to hepatitis A. Hepatitis B  vaccine  You may need this if you have certain conditions or if you travel or work in places where you may be exposed to hepatitis B. Haemophilus influenzae type b (Hib) vaccine  You may need this if you have certain conditions. You may receive vaccines as individual doses or as more than one vaccine together in one shot (combination vaccines). Talk with your health care provider about the risks and benefits of combination vaccines. What tests do I need? Blood tests  Lipid and cholesterol levels. These may be checked every 5 years, or more frequently depending on your overall health.  Hepatitis C test.  Hepatitis B test. Screening  Lung cancer screening. You may have this screening every year starting at age 74 if you have a 30-pack-year history of smoking and currently smoke or have quit within the past 15 years.  Colorectal cancer screening. All adults should have this screening starting at age 70 and continuing until age 55. Your health care provider may recommend screening at age 77 if you are at increased risk. You will have  tests every 1-10 years, depending on your results and the type of screening test.  Prostate cancer screening. Recommendations will vary depending on your family history and other risks.  Diabetes screening. This is done by checking your blood sugar (glucose) after you have not eaten for a while (fasting). You may have this done every 1-3 years.  Abdominal aortic aneurysm (AAA) screening. You may need this if you are a current or former smoker.  Sexually transmitted disease (STD) testing. Follow these instructions at home: Eating and drinking  Eat a diet that includes fresh fruits and vegetables, whole grains, lean protein, and low-fat dairy products. Limit your intake of foods with high amounts of sugar, saturated fats, and salt.  Take vitamin and mineral supplements as recommended by your health care provider.  Do not drink alcohol if your health care provider tells you not to drink.  If you drink alcohol: ? Limit how much you have to 0-2 drinks a day. ? Be aware of how much alcohol is in your drink. In the [place] one drink equals one 12 oz bottle of beer (355 mL), one 5 oz glass of wine (148 mL), or one 1 oz glass of hard liquor (44 mL). Lifestyle  Take daily care of your teeth and gums.  Stay active. Exercise for at least 30 minutes on 5 or more days each week.  Do not use any products that contain nicotine or tobacco, such as cigarettes, e-cigarettes, and chewing tobacco. If you need help quitting, ask your health care provider.  If you are sexually active, practice safe sex. Use a condom or other form of protection to prevent STIs (sexually transmitted infections).  Talk with your health care provider about taking a low-dose aspirin or statin. What's next?  Visit your health care provider once a year for a well check visit.  Ask your health care provider how often you should have your eyes and teeth checked.  Stay up to date on all vaccines. This information is not  intended to replace advice given to you by your health care provider. Make sure you discuss any questions you have with your health care provider. Document Revised: 08/30/2018 Document Reviewed: 08/30/2018 Elsevier Patient Education  2020 [place], [place] [facility]

## 2020-01-07 NOTE — Patient Instructions (Signed)
-Nice seeing you today!!  -Lab work today; will notify you once results are available.  -B12 injection today.  -Take ambien 5 mg at bedtime as needed for sleep.  -Tale tramadol 50 mg 1 tablet every 12 hours as needed for pain.  -Schedule follow up in 3 months.   Preventive Care 77 Years and Older, Male Preventive care refers to lifestyle choices and visits with your health care provider that can promote health and wellness. This includes:  A yearly physical exam. This is also called an annual well check.  Regular dental and eye exams.  Immunizations.  Screening for certain conditions.  Healthy lifestyle choices, such as diet and exercise. What can I expect for my preventive care visit? Physical exam Your health care provider will check:  Height and weight. These may be used to calculate body mass index (BMI), which is a measurement that tells if you are at a healthy weight.  Heart rate and blood pressure.  Your skin for abnormal spots. Counseling Your health care provider may ask you questions about:  Alcohol, tobacco, and drug use.  Emotional well-being.  Home and relationship well-being.  Sexual activity.  Eating habits.  History of falls.  Memory and ability to understand (cognition).  Work and work Statistician. What immunizations do I need?  Influenza (flu) vaccine  This is recommended every year. Tetanus, diphtheria, and pertussis (Tdap) vaccine  You may need a Td booster every 10 years. Varicella (chickenpox) vaccine  You may need this vaccine if you have not already been vaccinated. Zoster (shingles) vaccine  You may need this after age 57. Pneumococcal conjugate (PCV13) vaccine  One dose is recommended after age 65. Pneumococcal polysaccharide (PPSV23) vaccine  One dose is recommended after age 31. Measles, mumps, and rubella (MMR) vaccine  You may need at least one dose of MMR if you were born in 1957 or later. You may also need a  second dose. Meningococcal conjugate (MenACWY) vaccine  You may need this if you have certain conditions. Hepatitis A vaccine  You may need this if you have certain conditions or if you travel or work in places where you may be exposed to hepatitis A. Hepatitis B vaccine  You may need this if you have certain conditions or if you travel or work in places where you may be exposed to hepatitis B. Haemophilus influenzae type b (Hib) vaccine  You may need this if you have certain conditions. You may receive vaccines as individual doses or as more than one vaccine together in one shot (combination vaccines). Talk with your health care provider about the risks and benefits of combination vaccines. What tests do I need? Blood tests  Lipid and cholesterol levels. These may be checked every 5 years, or more frequently depending on your overall health.  Hepatitis C test.  Hepatitis B test. Screening  Lung cancer screening. You may have this screening every year starting at age 7 if you have a 30-pack-year history of smoking and currently smoke or have quit within the past 15 years.  Colorectal cancer screening. All adults should have this screening starting at age 66 and continuing until age 97. Your health care provider may recommend screening at age 12 if you are at increased risk. You will have tests every 1-10 years, depending on your results and the type of screening test.  Prostate cancer screening. Recommendations will vary depending on your family history and other risks.  Diabetes screening. This is done by checking your blood  sugar (glucose) after you have not eaten for a while (fasting). You may have this done every 1-3 years.  Abdominal aortic aneurysm (AAA) screening. You may need this if you are a current or former smoker.  Sexually transmitted disease (STD) testing. Follow these instructions at home: Eating and drinking  Eat a diet that includes fresh fruits and  vegetables, whole grains, lean protein, and low-fat dairy products. Limit your intake of foods with high amounts of sugar, saturated fats, and salt.  Take vitamin and mineral supplements as recommended by your health care provider.  Do not drink alcohol if your health care provider tells you not to drink.  If you drink alcohol: ? Limit how much you have to 0-2 drinks a day. ? Be aware of how much alcohol is in your drink. In the [place] one drink equals one 12 oz bottle of beer (355 mL), one 5 oz glass of wine (148 mL), or one 1 oz glass of hard liquor (44 mL). Lifestyle  Take daily care of your teeth and gums.  Stay active. Exercise for at least 30 minutes on 5 or more days each week.  Do not use any products that contain nicotine or tobacco, such as cigarettes, e-cigarettes, and chewing tobacco. If you need help quitting, ask your health care provider.  If you are sexually active, practice safe sex. Use a condom or other form of protection to prevent STIs (sexually transmitted infections).  Talk with your health care provider about taking a low-dose aspirin or statin. What's next?  Visit your health care provider once a year for a well check visit.  Ask your health care provider how often you should have your eyes and teeth checked.  Stay up to date on all vaccines. This information is not intended to replace advice given to you by your health care provider. Make sure you discuss any questions you have with your health care provider. Document Revised: 08/30/2018 Document Reviewed: 08/30/2018 Elsevier Patient Education  2020 [employer].

## 2020-01-09 ENCOUNTER — Ambulatory Visit: Payer: Medicare Other | Admitting: Sports Medicine

## 2020-01-09 ENCOUNTER — Other Ambulatory Visit: Payer: Self-pay

## 2020-01-09 VITALS — BP 160/82 | Ht 73.5 in | Wt 193.0 lb

## 2020-01-09 DIAGNOSIS — M1712 Unilateral primary osteoarthritis, left knee: Secondary | ICD-10-CM

## 2020-01-09 MED ORDER — METHYLPREDNISOLONE ACETATE 40 MG/ML IJ SUSP
40.0000 mg | Freq: Once | INTRAMUSCULAR | Status: AC
  Administered 2020-01-09: 40 mg via INTRA_ARTICULAR

## 2020-01-10 NOTE — Progress Notes (Signed)
   Subjective:    Patient ID: Ryan [name], male    DOB: 1941/07/31, 79 y.o.   MRN: [id number]  HPI chief complaint: Left knee pain  Very pleasant 79 year old male comes in today complaining of left knee pain.  This is a chronic issue for him.  He has a history of Charcot foot which has altered his gait significantly.  He tells me that he receives intermittent cortisone injections for his arthritic left knee which helped tremendously.  His pain today is identical in nature to what he is experienced previously.  He does endorse some mild swelling.  Denies any previous left knee surgery but is status post right knee arthroscopy 2 years ago.  Surgery was done in [facility]  He denies any recent trauma.  He does describe intermittent catching and popping.  In fact he states that the left knee popped yesterday and has felt better ever since.  Past medical history reviewed Medications reviewed Allergies reviewed    Review of Systems As above    Objective:   Physical Exam  Well-developed, well-nourished.  No acute distress.  Awake alert and oriented x3.  Vital signs reviewed.  Left knee: Patient has about a 5 degree extension lag.  Flexion to 110 degrees.  Trace effusion.  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  No tenderness to palpation.  Knee is grossly stable ligamentous exam.  Examination of the left foot shows a severe Charcot deformity.      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  An anterior lateral approach was utilized after risks and benefits were explained.  Patient tolerates this without difficulty.  Follow-up as needed.  Consent obtained and verified. Time-out conducted. Noted no overlying erythema, induration, or other signs of local infection. Skin prepped in a sterile fashion. Topical analgesic spray: Ethyl chloride. Joint: left knee Needle: 25g  1.5 inch Completed without difficulty. Meds: 3cc 1% xylocaine, 1cc (40mg ) depomedrol  Advised to call if fevers/chills, erythema, induration, drainage, or persistent bleeding.

## 2020-01-13 ENCOUNTER — Telehealth: Payer: Self-pay | Admitting: Internal Medicine

## 2020-01-13 NOTE — Telephone Encounter (Signed)
Pt is returning Rachel's call about his lab results. Pt states he is getting his second COVID inj in the morning and to call in the afternoon/evening    Pt can be reached at [phone]

## 2020-01-14 LAB — HM DIABETES EYE EXAM

## 2020-01-14 NOTE — Telephone Encounter (Signed)
Pt called three times today to reach CMA about his test results. On the third call, pt was upset because he is trying to coordinate with another doctors office and they are needing to know his recent A1C. I have spoken to Hindman during each call and she was assisting patients each time.   The pt would like for his results to be left as a voicemail on his phone at [phone]

## 2020-01-14 NOTE — Telephone Encounter (Signed)
Spoke with patient. See lab results 

## 2020-01-15 ENCOUNTER — Encounter: Payer: Self-pay | Admitting: Internal Medicine

## 2020-03-16 ENCOUNTER — Other Ambulatory Visit: Payer: Self-pay | Admitting: Internal Medicine

## 2020-03-16 DIAGNOSIS — E785 Hyperlipidemia, unspecified: Secondary | ICD-10-CM

## 2020-03-18 ENCOUNTER — Other Ambulatory Visit: Payer: Self-pay | Admitting: Internal Medicine

## 2020-03-18 DIAGNOSIS — E119 Type 2 diabetes mellitus without complications: Secondary | ICD-10-CM

## 2020-03-18 DIAGNOSIS — I10 Essential (primary) hypertension: Secondary | ICD-10-CM

## 2020-03-29 ENCOUNTER — Encounter (HOSPITAL_COMMUNITY): Payer: Self-pay | Admitting: Emergency Medicine

## 2020-03-29 ENCOUNTER — Inpatient Hospital Stay (HOSPITAL_COMMUNITY)
Admission: EM | Admit: 2020-03-29 | Discharge: 2020-04-03 | DRG: 286 | Disposition: A | Discharge status: Home / Self Care | Payer: Medicare Other | Attending: Internal Medicine | Admitting: Internal Medicine

## 2020-03-29 ENCOUNTER — Other Ambulatory Visit: Payer: Self-pay

## 2020-03-29 ENCOUNTER — Emergency Department (HOSPITAL_COMMUNITY): Payer: Medicare Other

## 2020-03-29 DIAGNOSIS — R778 Other specified abnormalities of plasma proteins: Secondary | ICD-10-CM | POA: Diagnosis present

## 2020-03-29 DIAGNOSIS — E1149 Type 2 diabetes mellitus with other diabetic neurological complication: Secondary | ICD-10-CM | POA: Diagnosis not present

## 2020-03-29 DIAGNOSIS — R001 Bradycardia, unspecified: Secondary | ICD-10-CM | POA: Diagnosis present

## 2020-03-29 DIAGNOSIS — G609 Hereditary and idiopathic neuropathy, unspecified: Secondary | ICD-10-CM

## 2020-03-29 DIAGNOSIS — E119 Type 2 diabetes mellitus without complications: Secondary | ICD-10-CM

## 2020-03-29 DIAGNOSIS — Z7984 Long term (current) use of oral hypoglycemic drugs: Secondary | ICD-10-CM

## 2020-03-29 DIAGNOSIS — N401 Enlarged prostate with lower urinary tract symptoms: Secondary | ICD-10-CM

## 2020-03-29 DIAGNOSIS — N179 Acute kidney failure, unspecified: Secondary | ICD-10-CM | POA: Diagnosis present

## 2020-03-29 DIAGNOSIS — I2510 Atherosclerotic heart disease of native coronary artery without angina pectoris: Secondary | ICD-10-CM | POA: Diagnosis present

## 2020-03-29 DIAGNOSIS — E559 Vitamin D deficiency, unspecified: Secondary | ICD-10-CM | POA: Diagnosis present

## 2020-03-29 DIAGNOSIS — F411 Generalized anxiety disorder: Secondary | ICD-10-CM | POA: Diagnosis present

## 2020-03-29 DIAGNOSIS — Z20822 Contact with and (suspected) exposure to covid-19: Secondary | ICD-10-CM | POA: Diagnosis present

## 2020-03-29 DIAGNOSIS — Z23 Encounter for immunization: Secondary | ICD-10-CM

## 2020-03-29 DIAGNOSIS — R7989 Other specified abnormal findings of blood chemistry: Secondary | ICD-10-CM | POA: Diagnosis present

## 2020-03-29 DIAGNOSIS — Z79899 Other long term (current) drug therapy: Secondary | ICD-10-CM

## 2020-03-29 DIAGNOSIS — Z833 Family history of diabetes mellitus: Secondary | ICD-10-CM

## 2020-03-29 DIAGNOSIS — I447 Left bundle-branch block, unspecified: Secondary | ICD-10-CM | POA: Diagnosis present

## 2020-03-29 DIAGNOSIS — I517 Cardiomegaly: Secondary | ICD-10-CM | POA: Diagnosis present

## 2020-03-29 DIAGNOSIS — G629 Polyneuropathy, unspecified: Secondary | ICD-10-CM

## 2020-03-29 DIAGNOSIS — I5021 Acute systolic (congestive) heart failure: Secondary | ICD-10-CM | POA: Diagnosis present

## 2020-03-29 DIAGNOSIS — E785 Hyperlipidemia, unspecified: Secondary | ICD-10-CM | POA: Diagnosis present

## 2020-03-29 DIAGNOSIS — E1165 Type 2 diabetes mellitus with hyperglycemia: Secondary | ICD-10-CM | POA: Diagnosis present

## 2020-03-29 DIAGNOSIS — N400 Enlarged prostate without lower urinary tract symptoms: Secondary | ICD-10-CM | POA: Diagnosis present

## 2020-03-29 DIAGNOSIS — R351 Nocturia: Secondary | ICD-10-CM

## 2020-03-29 DIAGNOSIS — I272 Pulmonary hypertension, unspecified: Secondary | ICD-10-CM | POA: Diagnosis present

## 2020-03-29 DIAGNOSIS — IMO0002 Reserved for concepts with insufficient information to code with codable children: Secondary | ICD-10-CM

## 2020-03-29 DIAGNOSIS — Z882 Allergy status to sulfonamides status: Secondary | ICD-10-CM

## 2020-03-29 DIAGNOSIS — I77810 Thoracic aortic ectasia: Secondary | ICD-10-CM | POA: Diagnosis present

## 2020-03-29 DIAGNOSIS — I421 Obstructive hypertrophic cardiomyopathy: Secondary | ICD-10-CM

## 2020-03-29 DIAGNOSIS — I951 Orthostatic hypotension: Secondary | ICD-10-CM | POA: Diagnosis not present

## 2020-03-29 DIAGNOSIS — E1142 Type 2 diabetes mellitus with diabetic polyneuropathy: Secondary | ICD-10-CM | POA: Diagnosis present

## 2020-03-29 DIAGNOSIS — I10 Essential (primary) hypertension: Secondary | ICD-10-CM | POA: Diagnosis present

## 2020-03-29 DIAGNOSIS — R55 Syncope and collapse: Secondary | ICD-10-CM | POA: Diagnosis not present

## 2020-03-29 DIAGNOSIS — I440 Atrioventricular block, first degree: Secondary | ICD-10-CM | POA: Diagnosis present

## 2020-03-29 DIAGNOSIS — I110 Hypertensive heart disease with heart failure: Secondary | ICD-10-CM | POA: Diagnosis present

## 2020-03-29 DIAGNOSIS — E860 Dehydration: Secondary | ICD-10-CM | POA: Diagnosis present

## 2020-03-29 DIAGNOSIS — E876 Hypokalemia: Secondary | ICD-10-CM | POA: Diagnosis present

## 2020-03-29 DIAGNOSIS — E538 Deficiency of other specified B group vitamins: Secondary | ICD-10-CM | POA: Diagnosis present

## 2020-03-29 DIAGNOSIS — I5041 Acute combined systolic (congestive) and diastolic (congestive) heart failure: Secondary | ICD-10-CM | POA: Diagnosis present

## 2020-03-29 LAB — SARS CORONAVIRUS 2 BY RT PCR (HOSPITAL ORDER, PERFORMED IN ~~LOC~~ HOSPITAL LAB): SARS Coronavirus 2: NEGATIVE

## 2020-03-29 LAB — URINALYSIS, ROUTINE W REFLEX MICROSCOPIC
Bilirubin Urine: NEGATIVE
Glucose, UA: >=500 mg/dL — AB
Hgb urine dipstick: NEGATIVE
Ketones, ur: 5 mg/dL — AB
Leukocytes,Ua: NEGATIVE
Nitrite: NEGATIVE
Protein, ur: NEGATIVE mg/dL
Specific Gravity, Urine: 1.036 — ABNORMAL HIGH (ref 1.005–1.030)
pH: 5 (ref 5.0–8.0)

## 2020-03-29 LAB — CBC WITH DIFFERENTIAL/PLATELET
Abs Immature Granulocytes: 0.01 10*3/uL (ref 0.00–0.07)
Basophils Absolute: 0 10*3/uL (ref 0.0–0.1)
Basophils Relative: 1 %
Eosinophils Absolute: 0.1 10*3/uL (ref 0.0–0.5)
Eosinophils Relative: 1 %
HCT: 41.1 % (ref 39.0–52.0)
Hemoglobin: 14.5 g/dL (ref 13.0–17.0)
Immature Granulocytes: 0 %
Lymphocytes Relative: 22 %
Lymphs Abs: 1.3 10*3/uL (ref 0.7–4.0)
MCH: 29.8 pg (ref 26.0–34.0)
MCHC: 35.3 g/dL (ref 30.0–36.0)
MCV: 84.6 fL (ref 80.0–100.0)
Monocytes Absolute: 0.4 10*3/uL (ref 0.1–1.0)
Monocytes Relative: 7 %
Neutro Abs: 4 10*3/uL (ref 1.7–7.7)
Neutrophils Relative %: 69 %
Platelets: 232 10*3/uL (ref 150–400)
RBC: 4.86 MIL/uL (ref 4.22–5.81)
RDW: 13.1 % (ref 11.5–15.5)
WBC: 5.7 10*3/uL (ref 4.0–10.5)
nRBC: 0 % (ref 0.0–0.2)

## 2020-03-29 LAB — LACTIC ACID, PLASMA: Lactic Acid, Venous: 4.1 mmol/L (ref 0.5–1.9)

## 2020-03-29 LAB — D-DIMER, QUANTITATIVE: D-Dimer, Quant: 0.75 ug/mL-FEU — ABNORMAL HIGH (ref 0.00–0.50)

## 2020-03-29 LAB — CBG MONITORING, ED
Glucose-Capillary: 311 mg/dL — ABNORMAL HIGH (ref 70–99)
Glucose-Capillary: 287 mg/dL — ABNORMAL HIGH (ref 70–99)

## 2020-03-29 LAB — TROPONIN I (HIGH SENSITIVITY)
Troponin I (High Sensitivity): 22 ng/L — ABNORMAL HIGH (ref <18)
Troponin I (High Sensitivity): 21 ng/L — ABNORMAL HIGH (ref <18)

## 2020-03-29 LAB — GLUCOSE, CAPILLARY: Glucose-Capillary: 300 mg/dL — ABNORMAL HIGH (ref 70–99)

## 2020-03-29 LAB — BASIC METABOLIC PANEL
Anion gap: 14 (ref 5–15)
BUN: 20 mg/dL (ref 8–23)
CO2: 20 mmol/L — ABNORMAL LOW (ref 22–32)
Calcium: 9.2 mg/dL (ref 8.9–10.3)
Chloride: 103 mmol/L (ref 98–111)
Creatinine, Ser: 1.25 mg/dL — ABNORMAL HIGH (ref 0.61–1.24)
GFR calc Af Amer: >60 mL/min (ref >60)
GFR calc non Af Amer: 56 mL/min — ABNORMAL LOW (ref >60)
Glucose, Bld: 340 mg/dL — ABNORMAL HIGH (ref 70–99)
Potassium: 3.8 mmol/L (ref 3.5–5.1)
Sodium: 137 mmol/L (ref 135–145)

## 2020-03-29 LAB — PHOSPHORUS: Phosphorus: 2.2 mg/dL — ABNORMAL LOW (ref 2.5–4.6)

## 2020-03-29 LAB — CK: Total CK: 66 U/L (ref 49–397)

## 2020-03-29 LAB — CREATININE, URINE, RANDOM: Creatinine, Urine: 56.37 mg/dL

## 2020-03-29 LAB — MAGNESIUM: Magnesium: 1.7 mg/dL (ref 1.7–2.4)

## 2020-03-29 LAB — SODIUM, URINE, RANDOM: Sodium, Ur: 62 mmol/L

## 2020-03-29 MED ORDER — GABAPENTIN 300 MG PO CAPS
300.0000 mg | ORAL_CAPSULE | Freq: Three times a day (TID) | ORAL | Status: DC
  Administered 2020-03-29 – 2020-04-03 (×14): 300 mg via ORAL
  Filled 2020-03-29 (×14): qty 1

## 2020-03-29 MED ORDER — ACETAMINOPHEN 325 MG PO TABS: 650.0000 mg | ORAL_TABLET | Freq: Four times a day (QID) | ORAL | Status: DC | PRN

## 2020-03-29 MED ORDER — ONDANSETRON HCL 4 MG/2ML IJ SOLN: 4.0000 mg | Freq: Four times a day (QID) | INTRAMUSCULAR | Status: DC | PRN

## 2020-03-29 MED ORDER — PAROXETINE HCL 20 MG PO TABS
40.0000 mg | ORAL_TABLET | Freq: Every day | ORAL | Status: DC
  Administered 2020-03-30 – 2020-04-03 (×5): 40 mg via ORAL
  Filled 2020-03-29 (×5): qty 2

## 2020-03-29 MED ORDER — SODIUM CHLORIDE 0.9 % IV BOLUS
1000.0000 mL | Freq: Once | INTRAVENOUS | Status: AC
  Administered 2020-03-29: 1000 mL via INTRAVENOUS

## 2020-03-29 MED ORDER — ONDANSETRON HCL 4 MG PO TABS: 4.0000 mg | ORAL_TABLET | Freq: Four times a day (QID) | ORAL | Status: DC | PRN

## 2020-03-29 MED ORDER — DOCUSATE SODIUM 100 MG PO CAPS
100.0000 mg | ORAL_CAPSULE | Freq: Two times a day (BID) | ORAL | Status: DC
  Administered 2020-03-29 – 2020-04-03 (×10): 100 mg via ORAL
  Filled 2020-03-29 (×10): qty 1

## 2020-03-29 MED ORDER — ATORVASTATIN CALCIUM 40 MG PO TABS
40.0000 mg | ORAL_TABLET | Freq: Every day | ORAL | Status: DC
  Administered 2020-03-29 – 2020-03-31 (×3): 40 mg via ORAL
  Filled 2020-03-29 (×3): qty 1

## 2020-03-29 MED ORDER — ACETAMINOPHEN 650 MG RE SUPP: 650.0000 mg | Freq: Four times a day (QID) | RECTAL | Status: DC | PRN

## 2020-03-29 MED ORDER — HYDROCODONE-ACETAMINOPHEN 5-325 MG PO TABS: 1.0000 | ORAL_TABLET | ORAL | Status: DC | PRN

## 2020-03-29 MED ORDER — SODIUM CHLORIDE 0.9% FLUSH
3.0000 mL | Freq: Two times a day (BID) | INTRAVENOUS | Status: DC
  Administered 2020-03-29 – 2020-04-01 (×4): 3 mL via INTRAVENOUS

## 2020-03-29 MED ORDER — SODIUM CHLORIDE 0.9 % IV BOLUS
500.0000 mL | Freq: Once | INTRAVENOUS | Status: AC
  Administered 2020-03-29: 500 mL via INTRAVENOUS

## 2020-03-29 MED ORDER — SODIUM CHLORIDE 0.9 % IV SOLN: INTRAVENOUS | Status: DC

## 2020-03-29 MED ORDER — ASPIRIN 81 MG PO CHEW
324.0000 mg | CHEWABLE_TABLET | Freq: Once | ORAL | Status: AC
  Administered 2020-03-29: 324 mg via ORAL
  Filled 2020-03-29: qty 4

## 2020-03-29 MED ORDER — TETANUS-DIPHTH-ACELL PERTUSSIS 5-2.5-18.5 LF-MCG/0.5 IM SUSP
0.5000 mL | Freq: Once | INTRAMUSCULAR | Status: AC
  Administered 2020-03-29: 0.5 mL via INTRAMUSCULAR
  Filled 2020-03-29: qty 0.5

## 2020-03-29 MED ORDER — INSULIN ASPART 100 UNIT/ML ~~LOC~~ SOLN
0.0000 [IU] | SUBCUTANEOUS | Status: DC
  Administered 2020-03-29: 5 [IU] via SUBCUTANEOUS
  Administered 2020-03-30: 2 [IU] via SUBCUTANEOUS
  Administered 2020-03-30 (×2): 3 [IU] via SUBCUTANEOUS
  Administered 2020-03-30 (×2): 1 [IU] via SUBCUTANEOUS
  Administered 2020-03-30: 3 [IU] via SUBCUTANEOUS
  Administered 2020-03-31 (×2): 5 [IU] via SUBCUTANEOUS
  Administered 2020-03-31 – 2020-04-01 (×3): 2 [IU] via SUBCUTANEOUS
  Administered 2020-04-01: 1 [IU] via SUBCUTANEOUS
  Administered 2020-04-01: 2 [IU] via SUBCUTANEOUS
  Administered 2020-04-01 (×4): 3 [IU] via SUBCUTANEOUS
  Administered 2020-04-02: 2 [IU] via SUBCUTANEOUS
  Administered 2020-04-02: 3 [IU] via SUBCUTANEOUS
  Administered 2020-04-02: 2 [IU] via SUBCUTANEOUS
  Filled 2020-03-29: qty 0.09

## 2020-03-29 MED ORDER — IOHEXOL 350 MG/ML SOLN
100.0000 mL | Freq: Once | INTRAVENOUS | Status: AC | PRN
  Administered 2020-03-29: 100 mL via INTRAVENOUS

## 2020-03-29 MED ORDER — SODIUM CHLORIDE (PF) 0.9 % IJ SOLN
INTRAMUSCULAR | Status: AC
  Filled 2020-03-29: qty 50

## 2020-03-29 MED ORDER — ENOXAPARIN SODIUM 40 MG/0.4ML ~~LOC~~ SOLN
40.0000 mg | SUBCUTANEOUS | Status: DC
  Administered 2020-03-29 – 2020-03-30 (×2): 40 mg via SUBCUTANEOUS
  Filled 2020-03-29 (×2): qty 0.4

## 2020-03-29 NOTE — ED Notes (Addendum)
Hospitalist to see pt. In room before taking to the floor. Charge Nurse jake ,notofed. Report called.

## 2020-03-29 NOTE — ED Provider Notes (Signed)
[facility]-EMERGENCY DEPT Provider Note   CSN: [id number] Arrival date & time: 03/29/20  1401     History Chief Complaint  Patient presents with  . Loss of Consciousness    Ryan [name] is a 79 y.o. male history of hypertension, hyperlipidemia, diabetes, peripheral neuropathy.  Patient presents today by EMS after syncope.  Patient reports that he was feeling warm on the way to the grocery store, he reports it was very hot out and he walked inside of the grocery store, reports he had a hard time cooling off.  He reports lightheadedness.  He was pushing the cart around when he had a syncopal episode, he is unsure how long he was unconscious for.  He reports he believes he struck his chin on the cart, but he has no pain in this area.  He believes that he struck the shopping cart with his right lower leg, he has skin tears to this area but denies any pain of the leg.  He reports that he had chest pain when he woke up from his syncopal episode, a right-sided pressure sensation radiating to his right arm no clear aggravating or alleviating factors, pain was moderate in intensity and resolved after a few minutes without intervention.  Patient denies recent illness, fever/chills, headache, vision changes, neck pain, back pain, abdominal pain, vomiting/diarrhea, numbness/tingling, weakness, extremity swelling/color change or any additional concerns.  HPI     Past Medical History:  Diagnosis Date  . B12 deficiency   . BPH (benign prostatic hyperplasia)   . DM (diabetes mellitus), type 2, uncontrolled w/neurologic complication (HCC)   . GAD (generalized anxiety disorder)   . HTN (hypertension)   . Hyperlipidemia   . Neuropathy, peripheral     Patient Active Problem List   Diagnosis Date Noted  . Vitamin D deficiency 01/07/2020  . HTN (hypertension)   . DM (diabetes mellitus), type 2, uncontrolled w/neurologic complication (HCC)   . Neuropathy, peripheral   .  BPH (benign prostatic hyperplasia)   . B12 deficiency   . GAD (generalized anxiety disorder)   . Hyperlipidemia     History reviewed. No pertinent surgical history.     Family History  Problem Relation Age of Onset  . Diabetes Mother     Social History   Tobacco Use  . Smoking status: Never Smoker  . Smokeless tobacco: Never Used  Substance Use Topics  . Alcohol use: Never  . Drug use: Never    Home Medications Prior to Admission medications   Medication Sig Start Date End Date Taking? Authorizing Provider  atorvastatin (LIPITOR) 40 MG tablet TAKE ONE TABLET BY MOUTH EVERY NIGHT AT BEDTIME 03/17/20  Yes Philip AspenHernandez Acosta, Limmie PatriciaEstela Y, MD  cyanocobalamin (,VITAMIN B-12,) 1000 MCG/ML injection inject 1 ml once a month 01/07/20  Yes Philip AspenHernandez Acosta, Limmie PatriciaEstela Y, MD  gabapentin (NEURONTIN) 300 MG capsule TAKE ONE CAPSULE BY MOUTH THREE TIMES A DAY 12/17/19  Yes Philip AspenHernandez Acosta, Limmie PatriciaEstela Y, MD  glimepiride (AMARYL) 2 MG tablet Take 1 tablet (2 mg total) by mouth 2 (two) times daily. 01/07/20  Yes Philip AspenHernandez Acosta, Limmie PatriciaEstela Y, MD  lisinopril-hydrochlorothiazide (ZESTORETIC) 20-25 MG tablet TAKE ONE TABLET BY MOUTH DAILY 03/18/20  Yes Philip AspenHernandez Acosta, Limmie PatriciaEstela Y, MD  metFORMIN (GLUCOPHAGE) 1000 MG tablet TAKE ONE TABLET BY MOUTH TWICE A DAY WITH MEALS 03/18/20  Yes Philip AspenHernandez Acosta, Limmie PatriciaEstela Y, MD  PARoxetine (PAXIL) 40 MG tablet Take 1 tablet (40 mg total) by mouth every morning. 06/05/19  Yes Ardyth HarpsHernandez  Priscella Mann, MD  tamsulosin (FLOMAX) 0.4 MG CAPS capsule Take 1 capsule (0.4 mg total) by mouth daily. 06/05/19  Yes Philip Aspen, Limmie Patricia, MD  cyanocobalamin (,VITAMIN B-12,) 1000 MCG/ML injection Inject 1 mL (1,000 mcg total) into the muscle every 30 (thirty) days. Patient not taking: Reported on 03/29/2020 06/05/19   Philip Aspen, Limmie Patricia, MD  Dulaglutide (TRULICITY) 0.75 MG/0.5ML SOPN Inject 0.75 mg into the skin once a week. Patient not taking: Reported on 03/29/2020 01/07/20   Philip Aspen, Limmie Patricia, MD  SYRINGE-NEEDLE, DISP, 3 ML (BD SAFETYGLIDE SYRINGE/NEEDLE) 25G X 1" 3 ML MISC Use for B12 injections 01/07/20   Philip Aspen, Limmie Patricia, MD  traMADol (ULTRAM) 50 MG tablet Take 1 tablet (50 mg total) by mouth every 12 (twelve) hours as needed for moderate pain. Patient not taking: Reported on 03/29/2020 01/07/20   Philip Aspen, Limmie Patricia, MD  zolpidem (AMBIEN) 5 MG tablet Take 1 tablet (5 mg total) by mouth at bedtime as needed for sleep. Patient not taking: Reported on 03/29/2020 01/07/20   Philip Aspen, Limmie Patricia, MD    Allergies    Sulfa antibiotics  Review of Systems   Review of Systems Ten systems are reviewed and are negative for acute change except as noted in the HPI  Physical Exam Updated Vital Signs BP (!) 147/114   Pulse 63   Temp (!) 97.5 F (36.4 C) (Oral)   Resp 20   Ht 6\' 2"  (1.88 m)   Wt 83.5 kg   SpO2 99%   BMI 23.62 kg/m   Physical Exam Constitutional:      General: He is not in acute distress.    Appearance: Normal appearance. He is well-developed. He is not ill-appearing or diaphoretic.  HENT:     Head: Normocephalic and atraumatic.     Jaw: There is normal jaw occlusion. No trismus.     Right Ear: External ear normal.     Left Ear: External ear normal.     Nose: Nose normal.     Mouth/Throat:     Comments: No dental injury Eyes:     General: Vision grossly intact. Gaze aligned appropriately.     Extraocular Movements: Extraocular movements intact.     Pupils: Pupils are equal, round, and reactive to light.  Neck:     Trachea: Trachea and phonation normal. No tracheal tenderness or tracheal deviation.  Cardiovascular:     Rate and Rhythm: Normal rate and regular rhythm.  Pulmonary:     Effort: Pulmonary effort is normal. No accessory muscle usage or respiratory distress.     Breath sounds: Normal air entry.  Chest:     Chest wall: No deformity or tenderness.  Abdominal:     General: There is no distension.      Palpations: Abdomen is soft.     Tenderness: There is no abdominal tenderness. There is no guarding or rebound.  Musculoskeletal:        General: Normal range of motion.     Cervical back: Normal range of motion and neck supple. No spinous process tenderness.  Skin:    General: Skin is warm and dry.  Neurological:     Mental Status: He is alert.     GCS: GCS eye subscore is 4. GCS verbal subscore is 5. GCS motor subscore is 6.     Comments: Speech is clear and goal oriented, follows commands Major Cranial nerves without deficit, no facial droop Moves extremities without ataxia,  coordination intact  Psychiatric:        Behavior: Behavior normal.     ED Results / Procedures / Treatments   Labs (all labs ordered are listed, but only abnormal results are displayed) Labs Reviewed  BASIC METABOLIC PANEL - Abnormal; Notable for the following components:      Result Value   CO2 20 (*)    Glucose, Bld 340 (*)    Creatinine, Ser 1.25 (*)    GFR calc non Af Amer 56 (*)    All other components within normal limits  URINALYSIS, ROUTINE W REFLEX MICROSCOPIC - Abnormal; Notable for the following components:   Specific Gravity, Urine 1.036 (*)    Glucose, UA >=500 (*)    Ketones, ur 5 (*)    Bacteria, UA RARE (*)    All other components within normal limits  D-DIMER, QUANTITATIVE (NOT AT [facility]) - Abnormal; Notable for the following components:   D-Dimer, Quant 0.75 (*)    All other components within normal limits  CBG MONITORING, ED - Abnormal; Notable for the following components:   Glucose-Capillary 311 (*)    All other components within normal limits  TROPONIN I (HIGH SENSITIVITY) - Abnormal; Notable for the following components:   Troponin I (High Sensitivity) 22 (*)    All other components within normal limits  TROPONIN I (HIGH SENSITIVITY) - Abnormal; Notable for the following components:   Troponin I (High Sensitivity) 21 (*)    All other components within normal limits  SARS  CORONAVIRUS 2 BY RT PCR (HOSPITAL ORDER, PERFORMED IN [facility])  CK  CBC WITH DIFFERENTIAL/PLATELET    EKG None  Radiology CT Head Wo Contrast  Result Date: 03/29/2020 CLINICAL DATA:  79 year old male with syncope. EXAM: CT HEAD WITHOUT CONTRAST TECHNIQUE: Contiguous axial images were obtained from the base of the skull through the vertex without intravenous contrast. COMPARISON:  11/27/2008 CT FINDINGS: Brain: No evidence of acute infarction, hemorrhage, hydrocephalus, extra-axial collection or mass lesion/mass effect. Atrophy and chronic small-vessel white matter ischemic changes are again noted. Vascular: Carotid and vertebral atherosclerotic calcifications are noted. Skull: Normal. Negative for fracture or focal lesion. Sinuses/Orbits: No acute finding. Other: None. IMPRESSION: 1. No evidence of acute intracranial abnormality. 2. Atrophy and chronic small-vessel white matter ischemic changes. Electronically Signed   By: Harmon Pier M.D.   On: 03/29/2020 17:04   CT Angio Chest PE W and/or Wo Contrast  Result Date: 03/29/2020 CLINICAL DATA:  Diaphoresis, dizziness, loss of consciousness EXAM: CT ANGIOGRAPHY CHEST WITH CONTRAST TECHNIQUE: Multidetector CT imaging of the chest was performed using the standard protocol during bolus administration of intravenous contrast. Multiplanar CT image reconstructions and MIPs were obtained to evaluate the vascular anatomy. CONTRAST:  OMNIPAQUE IOHEXOL 350 MG/ML SOLN COMPARISON:  03/29/2020 FINDINGS: Cardiovascular: This is a technically adequate evaluation of the pulmonary vasculature. No filling defects or pulmonary emboli. The heart is enlarged with mild left ventricular dilatation. There is moderate atherosclerosis throughout the coronary vasculature greatest in the LAD distribution. Mediastinum/Nodes: No enlarged mediastinal, hilar, or axillary lymph nodes. Thyroid gland, trachea, and esophagus demonstrate no significant findings.  Lungs/Pleura: No acute airspace disease, effusion, or pneumothorax. Central airways are patent. Upper Abdomen: Calcified gallstones are identified without cholecystitis. Right lobe liver cyst is noted. No acute upper abdominal findings otherwise. Musculoskeletal: No acute or destructive bony lesions. Reconstructed images demonstrate no additional findings. Review of the MIP images confirms the above findings. IMPRESSION: 1. No evidence of pulmonary embolus. 2. Cardiomegaly  with left ventricular dilatation. 3. Moderate coronary artery atherosclerosis. 4. Cholelithiasis without cholecystitis. Electronically Signed   By: Sharlet Salina M.D.   On: 03/29/2020 17:01   DG Chest Portable 1 View  Result Date: 03/29/2020 CLINICAL DATA:  Syncopal episode.  Chest pain.  Diabetes. EXAM: PORTABLE CHEST 1 VIEW COMPARISON:  11/27/2008 FINDINGS: The heart size and mediastinal contours are within normal limits. Both lungs are clear. The visualized skeletal structures are unremarkable. IMPRESSION: No active disease. Electronically Signed   By: Danae Orleans M.D.   On: 03/29/2020 15:03    Procedures Procedures (including critical care time)  Medications Ordered in ED Medications  sodium chloride (PF) 0.9 % injection (has no administration in time range)  Tdap (BOOSTRIX) injection 0.5 mL (has no administration in time range)  sodium chloride 0.9 % bolus 1,000 mL (0 mLs Intravenous Stopped 03/29/20 1635)  aspirin chewable tablet 324 mg (324 mg Oral Given 03/29/20 1459)  iohexol (OMNIPAQUE) 350 MG/ML injection 100 mL (100 mLs Intravenous Contrast Given 03/29/20 1644)    ED Course  I have reviewed the triage vital signs and the nursing notes.  Pertinent labs & imaging results that were available during my care of the patient were reviewed by me and considered in my medical decision making (see chart for details).  Clinical Course as of Mar 30 1839  Sun Mar 29, 2020  1833 Dr. Adela Glimpse   [BM]    Clinical Course User  Index [BM] Elizabeth Palau   MDM Rules/Calculators/A&P                         Additional History Obtained: 1. Nursing notes from this visit. 2. EMR record reviewed, no previous EKGs to compare. 3. Attempted to review care everywhere, patient reports he was seen by cardiologist at [facility] health care in [facility] several years ago for "something with the heart".  I was unable to locate any of those records, and her secretary attempting to have records faxed. ---- 79 year old male arrives after syncopal episode, preceding lightheadedness, diaphoresis.  Woke up with chest pain on the right side afterwards which has completely resolved.  EMS notes left bundle branch block, patient has no known history of LBBB however I am unable to see cardiology notes from [place].  Labs and fluids ordered along with chest x-ray and EKG.  Additionally patient has skin tears of the right lower leg without pain, good range of motion appropriate strength the joints above and below, do not feel any imaging of these areas is needed at this time.  Will update Tdap. -- I ordered, reviewed and interpreted labs which include: Initial troponin 22, delta troponin 21 CBC within normal limits, no anemia or leukocytosis CK within normal limits CBG 311 Urinalysis shows glucose and ketones, no evidence of infection, suggestive dehydration BMP shows mild elevation of creatinine suggestive of dehydration, no gap, bicarb minimally decreased at 28, no emergent electrolyte derangement.  Doubt DKA, suspect changes secondary to dehydration.  CT head:  IMPRESSION:  1. No evidence of acute intracranial abnormality.  2. Atrophy and chronic small-vessel white matter ischemic changes.   CT Angio PE:  IMPRESSION:  1. No evidence of pulmonary embolus.  2. Cardiomegaly with left ventricular dilatation.  3. Moderate coronary artery atherosclerosis.  4. Cholelithiasis without cholecystitis.   DG  Chest:  IMPRESSION:  No active disease.   EKG reviewed with Dr. Jeraldine Loots shows left bundle branch block. ---  Patient reevaluated he is sitting comfortably in bed vital signs within normal limits.  No recurrence of symptoms since arrival to the ER.  He has no pain or current complaints.  Case discussed with Dr. Jeraldine Loots, agrees with admission for syncope and collapse, possible concern for arrhythmia.  Patient is agreeable for admission. - Discussed case with Dr. Adela Glimpse at 6:33 PM, patient accepted to hospitalist service.  Note: Portions of this report may have been transcribed using voice recognition software. Every effort was made to ensure accuracy; however, inadvertent computerized transcription errors may still be present. Final Clinical Impression(s) / ED Diagnoses Final diagnoses:  Syncope and collapse    Rx / DC Orders ED Discharge Orders    None       Elizabeth Palau 03/29/20 1840    Gerhard Munch, MD 03/31/20 0000

## 2020-03-29 NOTE — ED Notes (Signed)
ED Provider at bedside. 

## 2020-03-29 NOTE — H&P (Signed)
Ryan [name]:[id number] DOB: 1941/01/26 DOA: 03/29/2020    PCP: Philip Aspen, Limmie Patricia, MD   Outpatient Specialists:  NONE Seen cardiology in the past in [place] not sure why  Patient arrived to ER on 03/29/20 at 1401 Referred by Attending Gerhard Munch, MD   Patient coming from: home Lives   With family   Chief Complaint:   Chief Complaint  Patient presents with   Loss of Consciousness    HPI: Ryan [name] is a 79 y.o. male with medical history significant of hypertension and diabetes hyperlipidemia, BPH on Flomax, general anxiety, B12 deficiency , neuropathy    Presented with syncopal episode while grocery store today patient describes he felt hot and sweaty dizzy and had a syncopal episode hitting the chin on a shopping cart if he fell down. He has not taken his Metformin today on EMS arrival blood sugar 341. He has syncopized before and that was due to the heat. Patient was so weak he could not get off the floor without EMS.  He was given 500 mL in route on arrival by EMS EKG showed left bundle branch block he was brought into [facility] emergency department When he woke up from his syncopal episode he did endorse right-sided chest pain radiating to his right arm and improved after few minutes spontaneously.  Have not had any recent fevers or chills no nausea no vomiting no diarrhea  Patient reports he have had a number of similar episodes in the past happening at the stores on a hot day .  NO recent medications changes Infectious risk factors:  Reports none    Has   been vaccinated against COVID    Initial COVID TEST   in house  PCR testing  Pending  No results found for: SARSCOV2NAA   Regarding pertinent Chronic problems:     Hyperlipidemia -  on statins Lipitor Lipid Panel     Component Value Date/Time   CHOL 129 01/07/2020 1025   TRIG 284.0 (H) 01/07/2020 1025   HDL 30.00 (L) 01/07/2020 1025   CHOLHDL 4 01/07/2020 1025    VLDL 56.8 (H) 01/07/2020 1025   LDLDIRECT 56.0 01/07/2020 1025   Diabetic neuropathy on Neurontin  HTN on lisinopril/hydrochlorothiazide   DM 2 -  Lab Results  Component Value Date   HGBA1C 10.8 (H) 01/07/2020   on  PO meds only, Amaryl metformin   BPH - on Flomax,     While in ER: Slightly elevated creatinine above baseline Glucose elevated 320 D-dimer was slightly elevated 0.75 CTA chest negative for PE Troponin stable x2  In 20s EKG showed left bundle branch block  He was given 324 mg of aspirin    Hospitalist was called for admission for syncope  The following Work up has been ordered so far:  Orders Placed This Encounter  Procedures   SARS Coronavirus 2 by RT PCR (hospital order, performed in [facility] hospital lab) Nasopharyngeal Nasopharyngeal Swab   DG Chest Portable 1 View   CT Angio Chest PE W and/or Wo Contrast   CT Head Wo Contrast   Basic metabolic panel   Urinalysis, Routine w reflex microscopic   D-dimer, quantitative (not at [facility])   CK   CBC with Differential   Cardiac monitoring   Document Height and Actual Weight   Consult to hospitalist  ALL PATIENTS BEING ADMITTED/HAVING PROCEDURES NEED COVID-19 SCREENING   Pulse oximetry, continuous   CBG monitoring, ED   ED EKG  Following Medications were ordered in ER: Medications  sodium chloride (PF) 0.9 % injection (has no administration in time range)  Tdap (BOOSTRIX) injection 0.5 mL (has no administration in time range)  sodium chloride 0.9 % bolus 1,000 mL (0 mLs Intravenous Stopped 03/29/20 1635)  aspirin chewable tablet 324 mg (324 mg Oral Given 03/29/20 1459)  iohexol (OMNIPAQUE) 350 MG/ML injection 100 mL (100 mLs Intravenous Contrast Given 03/29/20 1644)        Consult Orders  (From admission, onward)         Start     Ordered   03/29/20 1754  Consult to hospitalist  ALL PATIENTS BEING ADMITTED/HAVING PROCEDURES NEED COVID-19 SCREENING  Once       Comments: ALL PATIENTS  BEING ADMITTED/HAVING PROCEDURES NEED COVID-19 SCREENING  Provider:  (Not yet assigned)  Question Answer Comment  Place call to: [facility]   Reason for Consult Admit      03/29/20 1753          Significant initial  Findings: Abnormal Labs Reviewed  BASIC METABOLIC PANEL - Abnormal; Notable for the following components:      Result Value   CO2 20 (*)    Glucose, Bld 340 (*)    Creatinine, Ser 1.25 (*)    GFR calc non Af Amer 56 (*)    All other components within normal limits  URINALYSIS, ROUTINE W REFLEX MICROSCOPIC - Abnormal; Notable for the following components:   Specific Gravity, Urine 1.036 (*)    Glucose, UA >=500 (*)    Ketones, ur 5 (*)    Bacteria, UA RARE (*)    All other components within normal limits  D-DIMER, QUANTITATIVE (NOT AT [facility]) - Abnormal; Notable for the following components:   D-Dimer, Quant 0.75 (*)    All other components within normal limits  CBG MONITORING, ED - Abnormal; Notable for the following components:   Glucose-Capillary 311 (*)    All other components within normal limits  TROPONIN I (HIGH SENSITIVITY) - Abnormal; Notable for the following components:   Troponin I (High Sensitivity) 22 (*)    All other components within normal limits  TROPONIN I (HIGH SENSITIVITY) - Abnormal; Notable for the following components:   Troponin I (High Sensitivity) 21 (*)    All other components within normal limits     Otherwise labs showing:    Recent Labs  Lab 03/29/20 1425  NA 137  K 3.8  CO2 20*  GLUCOSE 340*  BUN 20  CREATININE 1.25*  CALCIUM 9.2    Cr    Up from baseline see below Lab Results  Component Value Date   CREATININE 1.25 (H) 03/29/2020   CREATININE 1.00 01/07/2020   CREATININE 0.97 11/27/2008    No results for input(s): AST, ALT, ALKPHOS, BILITOT, PROT, ALBUMIN in the last 168 hours. Lab Results  Component Value Date   CALCIUM 9.2 03/29/2020     WBC      Component Value Date/Time   WBC 5.7 03/29/2020  1440   ANC    Component Value Date/Time   NEUTROABS 4.0 03/29/2020 1440   ALC No components found for: LYMPHAB    Plt: Lab Results  Component Value Date   PLT 232 03/29/2020    Lactic Acid, Venous    Component Value Date/Time   LATICACIDVEN 4.1 (HH) 03/29/2020 1948        COVID-19 Labs  Recent Labs    03/29/20 1440  DDIMER 0.75*    No results found  for: SARSCOV2NAA    HG/HCT stable,      Component Value Date/Time   HGB 14.5 03/29/2020 1440   HCT 41.1 03/29/2020 1440    No results for input(s): LIPASE, AMYLASE in the last 168 hours. No results for input(s): AMMONIA in the last 168 hours.   Troponin 22 -21 Cardiac Panel (last 3 results) Recent Labs    03/29/20 1440  CKTOTAL 66       ECG: Ordered Personally reviewed by me showing: HR : 60 Rhythm:  LBBB, prolonged PR    no evidence of ischemic changes QTC 482   BNP (last 3 results) No results for input(s): BNP in the last 8760 hours.    DM  labs:  HbA1C: Recent Labs    06/05/19 1015 01/07/20 1025  HGBA1C 9.7* 10.8*       CBG (last 3)  Recent Labs    03/29/20 1433  GLUCAP 311*     UA   no evidence of UTI      Urine analysis:    Component Value Date/Time   COLORURINE YELLOW 03/29/2020 1425   APPEARANCEUR CLEAR 03/29/2020 1425   LABSPEC 1.036 (H) 03/29/2020 1425   PHURINE 5.0 03/29/2020 1425   GLUCOSEU >=500 (A) 03/29/2020 1425   HGBUR NEGATIVE 03/29/2020 1425   BILIRUBINUR NEGATIVE 03/29/2020 1425   KETONESUR 5 (A) 03/29/2020 1425   PROTEINUR NEGATIVE 03/29/2020 1425   UROBILINOGEN 0.2 11/27/2008 1045   NITRITE NEGATIVE 03/29/2020 1425   LEUKOCYTESUR NEGATIVE 03/29/2020 1425    Ordered  CT HEAD  NON acute  CXR -  NON acute    CTA chest -  nonacute, no PE,  no evidence of infiltrate  Cardiomegaly     ED Triage Vitals  Enc Vitals Group     BP 03/29/20 1421 (!) 147/83     Pulse Rate 03/29/20 1421 61     Resp 03/29/20 1421 18     Temp 03/29/20 1421 (!) 97.5 F (36.4  C)     Temp Source 03/29/20 1421 Oral     SpO2 03/29/20 1421 100 %     Weight 03/29/20 1501 184 lb (83.5 kg)     Height 03/29/20 1501  (1.88 m)     Head Circumference --      Peak Flow --      Pain Score 03/29/20 1423 0     Pain Loc --      Pain Edu? --      Excl. in GC? --   TMAX(24)@       Latest  Blood pressure (!) 147/114, pulse 63, temperature (!) 97.5 F (36.4 C), temperature source Oral, resp. rate 20, height  (1.88 m), weight 83.5 kg, SpO2 99 %.      Review of Systems:    Pertinent positives include: syncope chest pain,   Constitutional:  No weight loss, night sweats, Fevers, chills, fatigue, weight loss  HEENT:  No headaches, Difficulty swallowing,Tooth/dental problems,Sore throat,  No sneezing, itching, ear ache, nasal congestion, post nasal drip,  Cardio-vascular:  No Orthopnea, PND, anasarca, dizziness, palpitations.no Bilateral lower extremity swelling  GI:  No heartburn, indigestion, abdominal pain, nausea, vomiting, diarrhea, change in bowel habits, loss of appetite, melena, blood in stool, hematemesis Resp:  no shortness of breath at rest. No dyspnea on exertion, No excess mucus, no productive cough, No non-productive cough, No coughing up of blood.No change in color of mucus.No wheezing. Skin:  no rash or lesions. No jaundice GU:  no  dysuria, change in color of urine, no urgency or frequency. No straining to urinate.  No flank pain.  Musculoskeletal:  No joint pain or no joint swelling. No decreased range of motion. No back pain.  Psych:  No change in mood or affect. No depression or anxiety. No memory loss.  Neuro: no localizing neurological complaints, no tingling, no weakness, no double vision, no gait abnormality, no slurred speech, no confusion  All systems reviewed and apart from HOPI all are negative  Past Medical History:   Past Medical History:  Diagnosis Date   B12 deficiency    BPH (benign prostatic hyperplasia)    DM  (diabetes mellitus), type 2, uncontrolled w/neurologic complication (HCC)    GAD (generalized anxiety disorder)    HTN (hypertension)    Hyperlipidemia    Neuropathy, peripheral     History reviewed. No pertinent surgical history.  Social History:  Ambulatory  independently       reports that he has never smoked. He has never used smokeless tobacco. He reports that he does not drink alcohol and does not use drugs.     Family History:   Family History  Problem Relation Age of Onset   Diabetes Mother     Allergies: Allergies  Allergen Reactions   Sulfa Antibiotics     Blood in Urine     Prior to Admission medications   Medication Sig Start Date End Date Taking? Authorizing Provider  atorvastatin (LIPITOR) 40 MG tablet TAKE ONE TABLET BY MOUTH EVERY NIGHT AT BEDTIME 03/17/20  Yes Philip Aspen, Limmie Patricia, MD  cyanocobalamin (,VITAMIN B-12,) 1000 MCG/ML injection inject 1 ml once a month 01/07/20  Yes Philip Aspen, Limmie Patricia, MD  gabapentin (NEURONTIN) 300 MG capsule TAKE ONE CAPSULE BY MOUTH THREE TIMES A DAY 12/17/19  Yes Philip Aspen, Limmie Patricia, MD  glimepiride (AMARYL) 2 MG tablet Take 1 tablet (2 mg total) by mouth 2 (two) times daily. 01/07/20  Yes Philip Aspen, Limmie Patricia, MD  lisinopril-hydrochlorothiazide (ZESTORETIC) 20-25 MG tablet TAKE ONE TABLET BY MOUTH DAILY 03/18/20  Yes Philip Aspen, Limmie Patricia, MD  metFORMIN (GLUCOPHAGE) 1000 MG tablet TAKE ONE TABLET BY MOUTH TWICE A DAY WITH MEALS 03/18/20  Yes Philip Aspen, Limmie Patricia, MD  PARoxetine (PAXIL) 40 MG tablet Take 1 tablet (40 mg total) by mouth every morning. 06/05/19  Yes Philip Aspen, Limmie Patricia, MD  tamsulosin (FLOMAX) 0.4 MG CAPS capsule Take 1 capsule (0.4 mg total) by mouth daily. 06/05/19  Yes Philip Aspen, Limmie Patricia, MD  cyanocobalamin (,VITAMIN B-12,) 1000 MCG/ML injection Inject 1 mL (1,000 mcg total) into the muscle every 30 (thirty) days. Patient not taking: Reported on 03/29/2020  06/05/19   Philip Aspen, Limmie Patricia, MD  Dulaglutide (TRULICITY) 0.75 MG/0.5ML SOPN Inject 0.75 mg into the skin once a week. Patient not taking: Reported on 03/29/2020 01/07/20   Philip Aspen, Limmie Patricia, MD  SYRINGE-NEEDLE, DISP, 3 ML (BD SAFETYGLIDE SYRINGE/NEEDLE) 25G X 1" 3 ML MISC Use for B12 injections 01/07/20   Philip Aspen, Limmie Patricia, MD  traMADol (ULTRAM) 50 MG tablet Take 1 tablet (50 mg total) by mouth every 12 (twelve) hours as needed for moderate pain. Patient not taking: Reported on 03/29/2020 01/07/20   Philip Aspen, Limmie Patricia, MD  zolpidem (AMBIEN) 5 MG tablet Take 1 tablet (5 mg total) by mouth at bedtime as needed for sleep. Patient not taking: Reported on 03/29/2020 01/07/20   Philip Aspen, Limmie Patricia, MD   Physical Exam: Vitals with  BMI 03/29/2020 03/29/2020 03/29/2020  Height - - -  Weight - - -  BMI - - -  Systolic 147 146 098  Diastolic 114 76 90  Pulse 63 64 62     1. General:  in No Acute distress    Chronically ill  -appearing 2. Psychological: Alert and   Oriented 3. Head/ENT:    Dry Mucous Membranes                          Head Non traumatic, neck supple                           Poor Dentition 4. SKIN:  decreased Skin turgor,  Skin clean Dry and intact no rash 5. Heart: Regular rate and rhythm no Murmur, no Rub or gallop 6. Lungs: , no wheezes or crackles   7. Abdomen: Soft,  non-tender, Non distended bowel sounds present 8. Lower extremities: no clubbing, cyanosis, no  Edema some bruises 9. Neurologically Grossly intact, moving all 4 extremities equally   10. MSK: Normal range of motion   All other LABS:     Recent Labs  Lab 03/29/20 1440  WBC 5.7  NEUTROABS 4.0  HGB 14.5  HCT 41.1  MCV 84.6  PLT 232     Recent Labs  Lab 03/29/20 1425  NA 137  K 3.8  CL 103  CO2 20*  GLUCOSE 340*  BUN 20  CREATININE 1.25*  CALCIUM 9.2     No results for input(s): AST, ALT, ALKPHOS, BILITOT, PROT, ALBUMIN in the last 168 hours.       Cultures: No results found for: SDES, SPECREQUEST, CULT, REPTSTATUS   Radiological Exams on Admission: CT Head Wo Contrast  Result Date: 03/29/2020 CLINICAL DATA:  79 year old male with syncope. EXAM: CT HEAD WITHOUT CONTRAST TECHNIQUE: Contiguous axial images were obtained from the base of the skull through the vertex without intravenous contrast. COMPARISON:  11/27/2008 CT FINDINGS: Brain: No evidence of acute infarction, hemorrhage, hydrocephalus, extra-axial collection or mass lesion/mass effect. Atrophy and chronic small-vessel white matter ischemic changes are again noted. Vascular: Carotid and vertebral atherosclerotic calcifications are noted. Skull: Normal. Negative for fracture or focal lesion. Sinuses/Orbits: No acute finding. Other: None. IMPRESSION: 1. No evidence of acute intracranial abnormality. 2. Atrophy and chronic small-vessel white matter ischemic changes. Electronically Signed   By: Harmon Pier M.D.   On: 03/29/2020 17:04   CT Angio Chest PE W and/or Wo Contrast  Result Date: 03/29/2020 CLINICAL DATA:  Diaphoresis, dizziness, loss of consciousness EXAM: CT ANGIOGRAPHY CHEST WITH CONTRAST TECHNIQUE: Multidetector CT imaging of the chest was performed using the standard protocol during bolus administration of intravenous contrast. Multiplanar CT image reconstructions and MIPs were obtained to evaluate the vascular anatomy. CONTRAST:  OMNIPAQUE IOHEXOL 350 MG/ML SOLN COMPARISON:  03/29/2020 FINDINGS: Cardiovascular: This is a technically adequate evaluation of the pulmonary vasculature. No filling defects or pulmonary emboli. The heart is enlarged with mild left ventricular dilatation. There is moderate atherosclerosis throughout the coronary vasculature greatest in the LAD distribution. Mediastinum/Nodes: No enlarged mediastinal, hilar, or axillary lymph nodes. Thyroid gland, trachea, and esophagus demonstrate no significant findings. Lungs/Pleura: No acute airspace disease,  effusion, or pneumothorax. Central airways are patent. Upper Abdomen: Calcified gallstones are identified without cholecystitis. Right lobe liver cyst is noted. No acute upper abdominal findings otherwise. Musculoskeletal: No acute or destructive bony lesions. Reconstructed images demonstrate no additional  findings. Review of the MIP images confirms the above findings. IMPRESSION: 1. No evidence of pulmonary embolus. 2. Cardiomegaly with left ventricular dilatation. 3. Moderate coronary artery atherosclerosis. 4. Cholelithiasis without cholecystitis. Electronically Signed   By: Sharlet SalinaMichael  Brown M.D.   On: 03/29/2020 17:01   DG Chest Portable 1 View  Result Date: 03/29/2020 CLINICAL DATA:  Syncopal episode.  Chest pain.  Diabetes. EXAM: PORTABLE CHEST 1 VIEW COMPARISON:  11/27/2008 FINDINGS: The heart size and mediastinal contours are within normal limits. Both lungs are clear. The visualized skeletal structures are unremarkable. IMPRESSION: No active disease. Electronically Signed   By: Danae OrleansJohn A Stahl M.D.   On: 03/29/2020 15:03    Chart has been reviewed   Assessment/Plan  79 y.o. male with medical history significant of hypertension and diabetes hyperlipidemia, BPH on Flomax, general anxiety, B12 deficiency , neuropathy  Admitted for syncopal event  Present on Admission:  Syncope - reccurent given risk factor will admit rehydrate check orthostatics, obtain CE, monitor on tele and obtain carotid dopplers check echogram may need cardiology consult and follow up given recurrent syncopal events     BPH (benign prostatic hyperplasia) -given syncopal event hold off on Flomax for tonight   DM (diabetes mellitus), type 2, uncontrolled w/neurologic complication (HCC) -  - Order Sensitive  SSI   -  check TSH and HgA1C  - Hold by mouth medications  Poorly controlled order diabetes coordinator consult   GAD (generalized anxiety disorder) -chronic stable continue Paxil   HTN (hypertension) -given  syncope hold off on home medications for tonight given AKI hold off on lisinopril   Hyperlipidemia -chronic stable continue Lipitor   Cardiomegaly -obtain echogram consult cardiology if abnormal   Elevated troponin -   likely due to demand ischemia and poor clearance, monitor on telemetry and cycle cardiac enzymes to trend.  if continues to rise will need further work-up   LBBB (left bundle branch block) -unclear if chronic will obtain echogram to further evaluate  Elevated lactic aid though to be due to dehydration - will rehydrate and repeat   AKI (acute kidney injury) (HCC) -rehydrate and follow obtain urine electrolytes   Other plan as per orders.  DVT prophylaxis:  Lovenox       Code Status:    Code Status: Full Code FULL CODE as per patient   I had personally discussed CODE STATUS with patient      Family Communication:   Family not at  Bedside    Disposition Plan:     To home once workup is complete and patient is stable   Following barriers for discharge:                            Electrolytes corrected                        Cardiac work-up completed                   Would benefit from PT/OT eval prior to DC  Ordered                                     Consults called:   Emailed cardiology   Admission status:  ED Disposition    ED Disposition Condition Comment   Admit  [facility][id number]  Level of Care: Telemetry [5]  Admit to tele based on following criteria: Eval of Syncope  Covid Evaluation: Asymptomatic Screening Protocol (No Symptoms)  Diagnosis: Syncope [206001]  Admitting Physician: Therisa Doyne [[id number]]  Attending Physician: Therisa Doyne [[id number]]        Obs    Level of care    tele  For 12H    Lab Results  Component Value Date   SARSCOV2NAA NEGATIVE 03/29/2020     Precautions: admitted as asymptomatic screening protocol  PPE: Used by the provider:   N95  eye Goggles,  Gloves     Karmine Kauer 03/29/2020, 9:23 PM    [facility]     after 2 AM please page floor coverage PA If 7AM-7PM, please contact the day team taking care of the patient using [email or web address]   Patient was evaluated in the context of the global COVID-19 pandemic, which necessitated consideration that the patient might be at risk for infection with the SARS-CoV-2 virus that causes COVID-19. Institutional protocols and algorithms that pertain to the evaluation of patients at risk for COVID-19 are in a state of rapid change based on information released by regulatory bodies including the CDC and federal and state organizations. These policies and algorithms were followed during the patient's care.

## 2020-03-29 NOTE — ED Triage Notes (Signed)
[facility] pt was at grocery store and got hot, sweats, dizzy, pt hit chin on stopping cart handle on way to floor having LOC. Is diabetic hasnt had metformin today-cbg 341. Hx of having syncopal episode before due to heat. Pt wasn't able to get up off floor without assistance per EMS. Had LBBB on EKG obtained, reports no cardiac hx.  18g in left forearm NS given in route.

## 2020-03-30 ENCOUNTER — Observation Stay (HOSPITAL_BASED_OUTPATIENT_CLINIC_OR_DEPARTMENT_OTHER): Payer: Medicare Other

## 2020-03-30 ENCOUNTER — Observation Stay (HOSPITAL_COMMUNITY): Payer: Medicare Other

## 2020-03-30 ENCOUNTER — Other Ambulatory Visit: Payer: Self-pay

## 2020-03-30 DIAGNOSIS — R778 Other specified abnormalities of plasma proteins: Secondary | ICD-10-CM | POA: Diagnosis not present

## 2020-03-30 DIAGNOSIS — I10 Essential (primary) hypertension: Secondary | ICD-10-CM | POA: Diagnosis not present

## 2020-03-30 DIAGNOSIS — R55 Syncope and collapse: Secondary | ICD-10-CM

## 2020-03-30 DIAGNOSIS — N401 Enlarged prostate with lower urinary tract symptoms: Secondary | ICD-10-CM | POA: Diagnosis not present

## 2020-03-30 DIAGNOSIS — I447 Left bundle-branch block, unspecified: Secondary | ICD-10-CM

## 2020-03-30 DIAGNOSIS — E1149 Type 2 diabetes mellitus with other diabetic neurological complication: Secondary | ICD-10-CM | POA: Diagnosis not present

## 2020-03-30 DIAGNOSIS — N179 Acute kidney failure, unspecified: Secondary | ICD-10-CM | POA: Diagnosis not present

## 2020-03-30 DIAGNOSIS — I517 Cardiomegaly: Secondary | ICD-10-CM | POA: Diagnosis not present

## 2020-03-30 LAB — CBC WITH DIFFERENTIAL/PLATELET
Abs Immature Granulocytes: 0.03 10*3/uL (ref 0.00–0.07)
Basophils Absolute: 0 10*3/uL (ref 0.0–0.1)
Basophils Relative: 1 %
Eosinophils Absolute: 0.1 10*3/uL (ref 0.0–0.5)
Eosinophils Relative: 2 %
HCT: 36.9 % — ABNORMAL LOW (ref 39.0–52.0)
Hemoglobin: 12.8 g/dL — ABNORMAL LOW (ref 13.0–17.0)
Immature Granulocytes: 0 %
Lymphocytes Relative: 28 %
Lymphs Abs: 2.4 10*3/uL (ref 0.7–4.0)
MCH: 29.6 pg (ref 26.0–34.0)
MCHC: 34.7 g/dL (ref 30.0–36.0)
MCV: 85.4 fL (ref 80.0–100.0)
Monocytes Absolute: 0.7 10*3/uL (ref 0.1–1.0)
Monocytes Relative: 8 %
Neutro Abs: 5.1 10*3/uL (ref 1.7–7.7)
Neutrophils Relative %: 61 %
Platelets: 211 10*3/uL (ref 150–400)
RBC: 4.32 MIL/uL (ref 4.22–5.81)
RDW: 13.2 % (ref 11.5–15.5)
WBC: 8.4 10*3/uL (ref 4.0–10.5)
nRBC: 0 % (ref 0.0–0.2)

## 2020-03-30 LAB — COMPREHENSIVE METABOLIC PANEL
ALT: 16 U/L (ref 0–44)
AST: 14 U/L — ABNORMAL LOW (ref 15–41)
Albumin: 3.6 g/dL (ref 3.5–5.0)
Alkaline Phosphatase: 49 U/L (ref 38–126)
Anion gap: 10 (ref 5–15)
BUN: 17 mg/dL (ref 8–23)
CO2: 24 mmol/L (ref 22–32)
Calcium: 8.6 mg/dL — ABNORMAL LOW (ref 8.9–10.3)
Chloride: 104 mmol/L (ref 98–111)
Creatinine, Ser: 1 mg/dL (ref 0.61–1.24)
GFR calc Af Amer: >60 mL/min (ref >60)
GFR calc non Af Amer: >60 mL/min (ref >60)
Glucose, Bld: 154 mg/dL — ABNORMAL HIGH (ref 70–99)
Potassium: 3.4 mmol/L — ABNORMAL LOW (ref 3.5–5.1)
Sodium: 138 mmol/L (ref 135–145)
Total Bilirubin: 0.8 mg/dL (ref 0.3–1.2)
Total Protein: 5.7 g/dL — ABNORMAL LOW (ref 6.5–8.1)

## 2020-03-30 LAB — GLUCOSE, CAPILLARY
Glucose-Capillary: 142 mg/dL — ABNORMAL HIGH (ref 70–99)
Glucose-Capillary: 145 mg/dL — ABNORMAL HIGH (ref 70–99)
Glucose-Capillary: 187 mg/dL — ABNORMAL HIGH (ref 70–99)
Glucose-Capillary: 214 mg/dL — ABNORMAL HIGH (ref 70–99)
Glucose-Capillary: 232 mg/dL — ABNORMAL HIGH (ref 70–99)
Glucose-Capillary: 235 mg/dL — ABNORMAL HIGH (ref 70–99)
Glucose-Capillary: 295 mg/dL — ABNORMAL HIGH (ref 70–99)

## 2020-03-30 LAB — ECHOCARDIOGRAM COMPLETE
Height: 74 in
Weight: 2944 oz

## 2020-03-30 LAB — HEMOGLOBIN A1C
Hgb A1c MFr Bld: 10.4 % — ABNORMAL HIGH (ref 4.8–5.6)
Mean Plasma Glucose: 251.78 mg/dL

## 2020-03-30 LAB — LACTIC ACID, PLASMA
Lactic Acid, Venous: 2.2 mmol/L (ref 0.5–1.9)
Lactic Acid, Venous: 1.7 mmol/L (ref 0.5–1.9)

## 2020-03-30 LAB — TSH: TSH: 0.713 u[IU]/mL (ref 0.350–4.500)

## 2020-03-30 LAB — TROPONIN I (HIGH SENSITIVITY): Troponin I (High Sensitivity): 34 ng/L — ABNORMAL HIGH (ref <18)

## 2020-03-30 LAB — MAGNESIUM: Magnesium: 1.6 mg/dL — ABNORMAL LOW (ref 1.7–2.4)

## 2020-03-30 LAB — PHOSPHORUS: Phosphorus: 2.8 mg/dL (ref 2.5–4.6)

## 2020-03-30 MED ORDER — POTASSIUM CHLORIDE CRYS ER 20 MEQ PO TBCR
20.0000 meq | EXTENDED_RELEASE_TABLET | Freq: Once | ORAL | Status: AC
  Administered 2020-03-30: 20 meq via ORAL
  Filled 2020-03-30: qty 1

## 2020-03-30 MED ORDER — MAGNESIUM SULFATE 2 GM/50ML IV SOLN
2.0000 g | Freq: Once | INTRAVENOUS | Status: AC
  Administered 2020-03-30: 2 g via INTRAVENOUS
  Filled 2020-03-30: qty 50

## 2020-03-30 MED ORDER — LIVING WELL WITH DIABETES BOOK
Freq: Once | Status: AC
  Filled 2020-03-30: qty 1

## 2020-03-30 NOTE — Progress Notes (Signed)
PT Cancellation Note  Patient Details Name: Ryan [name] MRN: [id number] DOB: October 21, 1940   Cancelled Treatment:    Reason Eval/Treat Not Completed: Patient at procedure or test/unavailable Cardiac ultrasound and then multiple staff (white coats) in room this afternoon.  Will check back as schedule permits.   Cormac Wint,KATHrine E 03/30/2020, 2:40 PM Kati PT, DPT Acute Rehabilitation Services Pager: [phone] Office: [phone]

## 2020-03-30 NOTE — Progress Notes (Addendum)
PROGRESS NOTE  Ryan [name]  ZOX:[id number] DOB: [birth year] DOA: 03/29/2020 PCP: Philip Aspen, Limmie Patricia, MD   Brief Narrative: Ryan [name] is a 79 y.o. male with a history of poorly-controlled T2DM, HTN, HLD, peripheral neuropathy, BPH and syncopal episodes who presented after syncopal episode with prodromal lightheadedness at the grocery store on 7/11. EMS was called, found CBG to be 300, gave 500cc IVF. He has not been taking several diabetes medications and eats at irregular intervals per his son. In the ED troponin was mildly elevated at 22 >> 34 with no reports of chest pain, ECG demonstrating 1st degree AVB and LBBB with unclear chronicity. CTA chest showed no PE, but there were coronary calcifications. Labs showed lactic acid elevation which resolved with fluids, AKI with hyperglycemia which are improved. 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  Assessment & Plan: Active Problems:   HTN (hypertension)   DM (diabetes mellitus), type 2, uncontrolled w/neurologic complication (HCC)   Neuropathy, peripheral   BPH (benign prostatic hyperplasia)   GAD (generalized anxiety disorder)   Hyperlipidemia   Syncope   Cardiomegaly   Elevated troponin   LBBB (left bundle branch block)   AKI (acute kidney injury) (HCC)   Syncope and collapse  Syncope, likely orthostatic with prodrome: Suspect dehydration due to heat, irregular hydration/eating habits, and severe hyperglycemia acutely and chronically. After IVF resuscitation, orthostatic vital signs are normal. Carotid U/S without significant stenosis.  - Echocardiogram revealing severe LV dysfunction, await cardiology recommendations.  - With LBBB, significant ischemic risk factors and evident coronary calcifications, cardiology consulted, recommending  further stratification.  - If ischemia not felt to be causative, would discharge with cardiac monitoring.  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 Does not appear overloaded at this time, was dehydrated on admission. IV fluids stopped this morning.  - Will need initiation of guideline-based medications. With possibility of contrast if coronary angiography is recommended, will hold on ACE/ARB/ARNI at this time.  - Spoke with Dr. Mayford Knife who recommends cath tomorrow. I've conveyed this plan and some details to the patient this evening. NPO p MN.  Poorly-controlled T2DM: HbA1c 10.4%. Admittedly very poor diet with irregular eating habits including nearly no limitations on carbohydrate and sugar-rich foods and drinks. States he hasn't been taking diabetes medications recommended by PCP with exception of metformin.  - SSI while admitted. To minimize drastic changes, will likely discharge on metformin and restart [facility]. - Extensive education by RD/diabetes coordinator and medical team while admitted.  - Urged need for follow up, adherence to medications, and communication of intolerances/preferences in constructive relationship with PCP.   AKI:  - Improving with fluids. Can stop now that he's taking good po.   HTN:  - Holding home medication thus far  GAD:  - Continue SSRI  BPH:  - Restart flomax  Hyperlipidemia:  - Continue statin.   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  - Monitor  Hypokalemia, hypomagnesemia:  - Supplement  History of vitamin B12 deficiency with peripheral neuropathy: Level >1500 on last check.  - Continue gabapentin  DVT prophylaxis: Lovenox Code Status: Full Family Communication: Son at bedside Disposition Plan:  Status is: Observation  The patient remains OBS appropriate per discussion with UR.   Dispo: The patient is from: Home  Anticipated d/c is to: Home               Anticipated d/c date is: 1 day depending on planned work up of CHF and syncope and LBBB.               Patient currently is not medically stable to d/c.       Consultants:   Cardiology  Procedures:   Echocardiogram:  1. Left ventricular ejection fraction, by estimation, is 20 to 25%. The  left ventricle has severely decreased function. The left ventricle  demonstrates global hypokinesis. The left ventricular internal cavity size  was moderately dilated. There is  moderate left ventricular hypertrophy. Left ventricular diastolic  parameters are consistent with Grade II diastolic dysfunction  (pseudonormalization). Elevated left atrial pressure.  2. Right ventricular systolic function is normal. The right ventricular  size is normal.  3. The mitral valve is normal in structure. Mild mitral valve  regurgitation. No evidence of mitral stenosis.  4. The aortic valve is tricuspid. Aortic valve regurgitation is not  visualized. Mild aortic valve sclerosis is present, with no evidence of  aortic valve stenosis.  5. Aortic dilatation noted. There is mild dilatation of the aortic root  measuring 38 mm.   Antimicrobials:  None   Subjective: Feels much better, no lightheadedness. Denies current or recent chest pains. Did have right arm pain during episode, but has had right shoulder discomfort intermittently for decades and that was similar. Denies leg swelling, orthopnea.   Objective: Vitals:   03/29/20 2339 03/30/20 0354 03/30/20 1020 03/30/20 1820  BP: (!) 152/92 109/62 (!) 144/90 138/79  Pulse: 66 60 62 63  Resp: 18  18 18   Temp: 97.7 F (36.5 C) 97.7 F (36.5 C) 98.1 F (36.7 C) 98 F (36.7 C)  TempSrc: Oral Oral Oral Oral  SpO2: 98% 98% 100% 97%  Weight:      Height:        Intake/Output Summary (Last 24 hours) at 03/30/2020 1854 Last data filed at 03/30/2020 1800 Gross per 24 hour  Intake 600 ml  Output 1250 ml  Net -650 ml   Filed Weights    03/29/20 1501  Weight: 83.5 kg    Gen: 78 y.o. male in no distress  Pulm: Non-labored breathing room air. Clear to auscultation bilaterally.  CV: Regular rate and rhythm. No murmur, rub, or gallop. No JVD, no pedal edema. GI: Abdomen soft, non-tender, non-distended, with normoactive bowel sounds. No organomegaly or masses felt. Ext: Warm, no deformities Skin: No rashes, lesions or ulcers Neuro: Alert and oriented. No focal neurological deficits. Psych: Judgement and insight appear normal. Mood & affect appropriate.   Data Reviewed: I have personally reviewed following labs and imaging studies  CBC: Recent Labs  Lab 03/29/20 1440 03/30/20 0510  WBC 5.7 8.4  NEUTROABS 4.0 5.1  HGB 14.5 12.8*  HCT 41.1 36.9*  MCV 84.6 85.4  PLT 232 211   Basic Metabolic Panel: Recent Labs  Lab 03/29/20 1425 03/29/20 1639 03/30/20 0510  NA 137  --  138  K 3.8  --  3.4*  CL 103  --  104  CO2 20*  --  24  GLUCOSE 340*  --  154*  BUN 20  --  17  CREATININE 1.25*  --  1.00  CALCIUM 9.2  --  8.6*  MG  --  1.7 1.6*  PHOS  --  2.2* 2.8   GFR: Estimated Creatinine Clearance: 73.1 mL/min (by C-G formula  based on SCr of 1 mg/dL). Liver Function Tests: Recent Labs  Lab 03/30/20 0510  AST 14*  ALT 16  ALKPHOS 49  BILITOT 0.8  PROT 5.7*  ALBUMIN 3.6   No results for input(s): LIPASE, AMYLASE in the last 168 hours. No results for input(s): AMMONIA in the last 168 hours. Coagulation Profile: No results for input(s): INR, PROTIME in the last 168 hours. Cardiac Enzymes: Recent Labs  Lab 03/29/20 1440  CKTOTAL 66   BNP (last 3 results) No results for input(s): PROBNP in the last 8760 hours. HbA1C: Recent Labs    03/29/20 1948  HGBA1C 10.4*   CBG: Recent Labs  Lab 03/30/20 0054 03/30/20 0344 03/30/20 0749 03/30/20 1157 03/30/20 1637  GLUCAP 235* 142* 145* 214* 187*   Lipid Profile: No results for input(s): CHOL, HDL, LDLCALC, TRIG, CHOLHDL, LDLDIRECT in the last 72  hours. Thyroid Function Tests: Recent Labs    03/30/20 0510  TSH 0.713   Anemia Panel: No results for input(s): VITAMINB12, FOLATE, FERRITIN, TIBC, IRON, RETICCTPCT in the last 72 hours. Urine analysis:    Component Value Date/Time   COLORURINE YELLOW 03/29/2020 1425   APPEARANCEUR CLEAR 03/29/2020 1425   LABSPEC 1.036 (H) 03/29/2020 1425   PHURINE 5.0 03/29/2020 1425   GLUCOSEU >=500 (A) 03/29/2020 1425   HGBUR NEGATIVE 03/29/2020 1425   BILIRUBINUR NEGATIVE 03/29/2020 1425   KETONESUR 5 (A) 03/29/2020 1425   PROTEINUR NEGATIVE 03/29/2020 1425   UROBILINOGEN 0.2 11/27/2008 1045   NITRITE NEGATIVE 03/29/2020 1425   LEUKOCYTESUR NEGATIVE 03/29/2020 1425   Recent Results (from the past 240 hour(s))  SARS Coronavirus 2 by RT PCR (hospital order, performed in [facility] hospital lab) Nasopharyngeal Nasopharyngeal Swab     Status: None   Collection Time: 03/29/20  6:34 PM   Specimen: Nasopharyngeal Swab  Result Value Ref Range Status   SARS Coronavirus 2 NEGATIVE NEGATIVE Final    Comment: (NOTE) SARS-CoV-2 target nucleic acids are NOT DETECTED.  The SARS-CoV-2 RNA is generally detectable in upper and lower respiratory specimens during the acute phase of infection. The lowest concentration of SARS-CoV-2 viral copies this assay can detect is 250 copies / mL. A negative result does not preclude SARS-CoV-2 infection and should not be used as the sole basis for treatment or other patient management decisions.  A negative result may occur with improper specimen collection / handling, submission of specimen other than nasopharyngeal swab, presence of viral mutation(s) within the areas targeted by this assay, and inadequate number of viral copies (<250 copies / mL). A negative result must be combined with clinical observations, patient history, and epidemiological information.  Fact Sheet for Patients:   BoilerBrush.com.cy  Fact Sheet for Healthcare  Providers: https://pope.com/  This test is not yet approved or  cleared by the [place] FDA and has been authorized for detection and/or diagnosis of SARS-CoV-2 by FDA under an Emergency Use Authorization (EUA).  This EUA will remain in effect (meaning this test can be used) for the duration of the COVID-19 declaration under Section 564(b)(1) of the Act, 21 U.S.C. section 360bbb-3(b)(1), unless the authorization is terminated or revoked sooner.  Performed at [facility], [address], [place], [place] [address]       Radiology Studies: CT Head Wo Contrast  Result Date: 03/29/2020 CLINICAL DATA:  79 year old male with syncope. EXAM: CT HEAD WITHOUT CONTRAST TECHNIQUE: Contiguous axial images were obtained from the base of the skull through the vertex without intravenous contrast. COMPARISON:  11/27/2008 CT FINDINGS: Brain: No evidence of acute infarction, hemorrhage, hydrocephalus, extra-axial collection or mass lesion/mass effect. Atrophy and chronic small-vessel white matter ischemic changes are again noted. Vascular: Carotid and vertebral atherosclerotic calcifications are noted. Skull: Normal. Negative for fracture or focal lesion. Sinuses/Orbits: No acute finding. Other: None. IMPRESSION: 1. No evidence of acute intracranial abnormality. 2. Atrophy and chronic small-vessel white matter ischemic changes. Electronically Signed   By: Harmon PierJeffrey  Hu M.D.   On: 03/29/2020 17:04   CT Angio Chest PE W and/or Wo Contrast  Result Date: 03/29/2020 CLINICAL DATA:  Diaphoresis, dizziness, loss of consciousness EXAM: CT ANGIOGRAPHY CHEST WITH CONTRAST TECHNIQUE: Multidetector CT imaging of the chest was performed using the standard protocol during bolus administration of intravenous contrast. Multiplanar CT image reconstructions and MIPs were obtained to evaluate the vascular anatomy. CONTRAST:  100mL OMNIPAQUE IOHEXOL 350 MG/ML SOLN COMPARISON:   03/29/2020 FINDINGS: Cardiovascular: This is a technically adequate evaluation of the pulmonary vasculature. No filling defects or pulmonary emboli. The heart is enlarged with mild left ventricular dilatation. There is moderate atherosclerosis throughout the coronary vasculature greatest in the LAD distribution. Mediastinum/Nodes: No enlarged mediastinal, hilar, or axillary lymph nodes. Thyroid gland, trachea, and esophagus demonstrate no significant findings. Lungs/Pleura: No acute airspace disease, effusion, or pneumothorax. Central airways are patent. Upper Abdomen: Calcified gallstones are identified without cholecystitis. Right lobe liver cyst is noted. No acute upper abdominal findings otherwise. Musculoskeletal: No acute or destructive bony lesions. Reconstructed images demonstrate no additional findings. Review of the MIP images confirms the above findings. IMPRESSION: 1. No evidence of pulmonary embolus. 2. Cardiomegaly with left ventricular dilatation. 3. Moderate coronary artery atherosclerosis. 4. Cholelithiasis without cholecystitis. Electronically Signed   By: Sharlet SalinaMichael  Brown M.D.   On: 03/29/2020 17:01   DG Chest Portable 1 View  Result Date: 03/29/2020 CLINICAL DATA:  Syncopal episode.  Chest pain.  Diabetes. EXAM: PORTABLE CHEST 1 VIEW COMPARISON:  11/27/2008 FINDINGS: The heart size and mediastinal contours are within normal limits. Both lungs are clear. The visualized skeletal structures are unremarkable. IMPRESSION: No active disease. Electronically Signed   By: Danae OrleansJohn A Stahl M.D.   On: 03/29/2020 15:03   ECHOCARDIOGRAM COMPLETE  Result Date: 03/30/2020    ECHOCARDIOGRAM REPORT   Patient Name:   Ryan [name] Date of Exam: 03/30/2020 Medical Rec #:  [id number]           Height:       74.0 in Accession #:    [id number]          Weight:       184.0 lb Date of Birth:  09/29/1940           BSA:          2.098 m Patient Age:    78 years            BP:           144/90 mmHg Patient Gender: M                    HR:           62 bpm. Exam Location:  Inpatient Procedure: 2D Echo, Cardiac Doppler and Color Doppler Indications:    Syncope  History:        Patient has no prior history of Echocardiogram examinations.                 Arrythmias:LBBB, Signs/Symptoms:Syncope; Risk  Factors:Hypertension, Diabetes and Dyslipidemia.  Sonographer:    Lavenia Atlas Referring Phys: [id number] ANASTASSIA DOUTOVA IMPRESSIONS  1. Left ventricular ejection fraction, by estimation, is 20 to 25%. The left ventricle has severely decreased function. The left ventricle demonstrates global hypokinesis. The left ventricular internal cavity size was moderately dilated. There is moderate left ventricular hypertrophy. Left ventricular diastolic parameters are consistent with Grade II diastolic dysfunction (pseudonormalization). Elevated left atrial pressure.  2. Right ventricular systolic function is normal. The right ventricular size is normal.  3. The mitral valve is normal in structure. Mild mitral valve regurgitation. No evidence of mitral stenosis.  4. The aortic valve is tricuspid. Aortic valve regurgitation is not visualized. Mild aortic valve sclerosis is present, with no evidence of aortic valve stenosis.  5. Aortic dilatation noted. 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 FINDINGS  Left Ventricle: Left ventricular ejection fraction, by estimation, is 20 to 25%. The left ventricle has severely decreased function. The left ventricle demonstrates global hypokinesis. The left ventricular internal cavity size was moderately dilated. There is moderate left ventricular hypertrophy. Left ventricular diastolic parameters are consistent with Grade II diastolic dysfunction (pseudonormalization). Elevated left atrial pressure. Right Ventricle: The right ventricular size is normal. Right ventricular systolic function is normal. Left Atrium: Left atrial size was normal in size. Right Atrium: Right atrial  size was normal in size. Pericardium: There is no evidence of pericardial effusion. Mitral Valve: The mitral valve is normal in structure. Normal mobility of the mitral valve leaflets. Mild mitral valve regurgitation. No evidence of mitral valve stenosis. Tricuspid Valve: The tricuspid valve is normal in structure. Tricuspid valve regurgitation is trivial. No evidence of tricuspid stenosis. Aortic Valve: The aortic valve is tricuspid. Aortic valve regurgitation is not visualized. Mild aortic valve sclerosis is present, with no evidence of aortic valve stenosis. Pulmonic Valve: The pulmonic valve was normal in structure. Pulmonic valve regurgitation is mild. No evidence of pulmonic stenosis. Aorta: Aortic dilatation noted. 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 IAS/Shunts: No atrial level shunt detected by color flow Doppler.  LEFT VENTRICLE PLAX 2D LVIDd:         6.10 cm  Diastology LVIDs:         5.00 cm  LV e' lateral:   7.35 cm/s LV PW:         1.50 cm  LV E/e' lateral: 9.4 LV IVS:        1.50 cm  LV e' medial:    3.68 cm/s LVOT diam:     2.80 cm  LV E/e' medial:  18.8 LV SV:         72 LV SV Index:   34 LVOT Area:     6.16 cm  RIGHT VENTRICLE RV Basal diam:  3.50 cm RV S prime:     10.80 cm/s TAPSE (M-mode): 2.7 cm LEFT ATRIUM             Index       RIGHT ATRIUM           Index LA diam:        4.50 cm 2.15 cm/m  RA Area:     23.80 cm LA Vol (A2C):   52.9 ml 25.22 ml/m RA Volume:   73.70 ml  35.13 ml/m LA Vol (A4C):   44.7 ml 21.31 ml/m LA Biplane Vol: 50.9 ml 24.26 ml/m  AORTIC VALVE LVOT Vmax:   62.00 cm/s LVOT Vmean:  41.200 cm/s  LVOT VTI:    0.117 m  AORTA Ao Root diam: 3.80 cm MITRAL VALVE               TRICUSPID VALVE MV Area (PHT): 3.17 cm    TR Peak grad:   9.4 mmHg MV Decel Time: 239 msec    TR Vmax:        153.00 cm/s MV E velocity: 69.00 cm/s MV A velocity: 39.40 cm/s  SHUNTS MV E/A ratio:  1.75        Systemic VTI:  0.12 m                            Systemic Diam: 2.80  cm Olga Millers MD Electronically signed by Olga Millers MD Signature Date/Time: 03/30/2020/4:01:32 PM    Final    VAS US CAROTID  Result Date: 03/30/2020 Carotid Arterial Duplex Study Indications:       Syncope. Risk Factors:      Hypertension, hyperlipidemia, Diabetes. Comparison Study:  No prior studies. Performing Technologist: Jean Rosenthal  Examination Guidelines: A complete evaluation includes B-mode imaging, spectral Doppler, color Doppler, and power Doppler as needed of all accessible portions of each vessel. Bilateral testing is considered an integral part of a complete examination. Limited examinations for reoccurring indications may be performed as noted.  Right Carotid Findings: +----------+--------+--------+--------+------------------+------------------+           PSV cm/sEDV cm/sStenosisPlaque DescriptionComments           +----------+--------+--------+--------+------------------+------------------+ CCA Prox  62      9                                 intimal thickening +----------+--------+--------+--------+------------------+------------------+ CCA Distal58      9                                 intimal thickening +----------+--------+--------+--------+------------------+------------------+ ICA Prox  40      10      1-39%                                        +----------+--------+--------+--------+------------------+------------------+ ICA Distal26      9                                                    +----------+--------+--------+--------+------------------+------------------+ ECA       71      9                                                    +----------+--------+--------+--------+------------------+------------------+ +----------+--------+-------+----------------+-------------------+           PSV cm/sEDV cmsDescribe        Arm Pressure (mmHG) +----------+--------+-------+----------------+-------------------+ [id number]              Multiphasic, WNL                    +----------+--------+-------+----------------+-------------------+ +---------+--------+--+--------+-+---------+ VertebralPSV cm/s25EDV cm/s7Antegrade +---------+--------+--+--------+-+---------+  Left  Carotid Findings: +----------+--------+--------+--------+------------------+------------------+           PSV cm/sEDV cm/sStenosisPlaque DescriptionComments           +----------+--------+--------+--------+------------------+------------------+ CCA Prox  59      11                                intimal thickening +----------+--------+--------+--------+------------------+------------------+ CCA Distal63      17                                intimal thickening +----------+--------+--------+--------+------------------+------------------+ ICA Prox  42      11      1-39%                                        +----------+--------+--------+--------+------------------+------------------+ ICA Distal61      17                                                   +----------+--------+--------+--------+------------------+------------------+ ECA       77      9                                                    +----------+--------+--------+--------+------------------+------------------+ +----------+--------+--------+----------------+-------------------+           PSV cm/sEDV cm/sDescribe        Arm Pressure (mmHG) +----------+--------+--------+----------------+-------------------+ [id number]              Multiphasic, WNL                    +----------+--------+--------+----------------+-------------------+ +---------+--------+--+--------+--+---------+ VertebralPSV cm/s44EDV cm/s11Antegrade +---------+--------+--+--------+--+---------+   Summary: Right Carotid: Velocities in the right ICA are consistent with a 1-39% stenosis. Left Carotid: Velocities in the left ICA are consistent with a 1-39% stenosis. Vertebrals:   Bilateral vertebral arteries demonstrate antegrade flow. Subclavians: Normal flow hemodynamics were seen in bilateral subclavian              arteries. *See table(s) above for measurements and observations.     Preliminary     Scheduled Meds: . atorvastatin  40 mg Oral QHS  . docusate sodium  100 mg Oral BID  . enoxaparin (LOVENOX) injection  40 mg Subcutaneous Q24H  . gabapentin  300 mg Oral TID  . insulin aspart  0-9 Units Subcutaneous Q4H  . PARoxetine  40 mg Oral Daily  . sodium chloride flush  3 mL Intravenous Q12H   Continuous Infusions:   LOS: 0 days   Time spent: 35 minutes.  Tyrone Nine, MD [facility] [email or web address] 03/30/2020, 6:54 PM

## 2020-03-30 NOTE — Consult Note (Addendum)
Cardiology Consultation:   Patient ID: Ryan MarbleRobert Gray [name] MRN: [id number]; DOB: 04/14/1941  Admit date: 03/29/2020 Date of Consult: 03/30/2020  Primary Care Provider: Philip AspenHernandez Acosta, Limmie PatriciaEstela Y, MD [facility] Cardiologist: Armanda Magicraci Turner, MD [facility] Electrophysiologist:  None    Patient Profile:   Ryan MarbleRobert Gray [name] is a 79 y.o. male with a hx of HTN, HLD, DM, BPH on flomax, anxiety, and neuropathy who is being seen today for the evaluation of syncope at the request of Dr. Jarvis NewcomerGrunz.  History of Present Illness:   Ryan [name] does not currently follow with cardiology. [place] and worked as a Lawyersubstitute teacher. [facility]. He started walking for exercise and was cleared by cardiology without a heart monitor. Since COVID, he has moved in with his son in [place], who helps with history. He reports 4-5 syncopal events over the past 4 years. He generally has one episode in the summer due to the heat. He denies prodromal symptoms.   [facility] a syncopal episode in [place] yesterday. He was shopping with his son and became lightheaded. He said "don't let me hit my head." Son left to get one more item and returned to find his father had laid down on the ground - not because he passed out but in an attempt to avoid injury.  Son lifted his father up to put him in a chair. On the way down to sit, he hit his chin in the grocery cart and then lost consciousness.  EMS was dispatched. BG was over 300. He later reported being out of one of his DM medications. During other syncopal episodes, his BG was lower (he thinks in the 60-80s). EMS administered 500 cc bolus.  EKG with LBBB - unclear chronicity. Initial H&P reports chest pain, but patient adamantly denies chest pain to me. He denies SOB, DOE, orthopnea, palpitations, and lower extremity swelling. He reports that he passes out because of the heat.     On arrival: AKI with sCr 1.25 Lactic 4.1 --> 2.2 --> 1.7 CK normal Mg 1.6 HS troponin trended to 22 --> 21 --> 34 D-dimer elevated, CTA negative for PE, but did note coronary atherosclerosis.  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    Past Medical History:  Diagnosis Date  . B12 deficiency   . BPH (benign prostatic hyperplasia)   . DM (diabetes mellitus), type 2, uncontrolled w/neurologic complication (HCC)   . GAD (generalized anxiety disorder)   . HTN (hypertension)   . Hyperlipidemia   . Neuropathy, peripheral     History reviewed. No pertinent surgical history.   Home Medications:  Prior to Admission medications   Medication Sig Start Date End Date Taking? Authorizing Provider  atorvastatin (LIPITOR) 40 MG tablet TAKE ONE TABLET BY MOUTH EVERY NIGHT AT BEDTIME 03/17/20  Yes Philip AspenHernandez Acosta, Limmie PatriciaEstela Y, MD  cyanocobalamin (,VITAMIN B-12,) 1000 MCG/ML injection inject 1 ml once a month 01/07/20  Yes Philip AspenHernandez Acosta, Limmie PatriciaEstela Y, MD  gabapentin (NEURONTIN) 300 MG capsule TAKE ONE CAPSULE BY MOUTH THREE TIMES A DAY 12/17/19  Yes Philip AspenHernandez Acosta, Limmie PatriciaEstela Y, MD  glimepiride (AMARYL) 2 MG tablet Take 1 tablet (2 mg total) by mouth 2 (two) times daily. 01/07/20  Yes Philip AspenHernandez Acosta, Limmie PatriciaEstela Y, MD  lisinopril-hydrochlorothiazide (ZESTORETIC) 20-25 MG tablet TAKE ONE TABLET BY MOUTH DAILY 03/18/20  Yes Philip Aspen, Limmie Patricia, MD  metFORMIN (GLUCOPHAGE) 1000 MG tablet TAKE ONE TABLET BY MOUTH TWICE A DAY WITH MEALS 03/18/20  Yes Philip Aspen, Limmie Patricia, MD  PARoxetine (PAXIL) 40 MG tablet Take 1 tablet (40 mg total) by mouth every morning. 06/05/19  Yes Philip Aspen, Limmie Patricia, MD  tamsulosin (FLOMAX) 0.4 MG CAPS capsule Take 1 capsule (0.4 mg total) by mouth daily. 06/05/19  Yes Philip Aspen, Limmie Patricia, MD  cyanocobalamin (,VITAMIN B-12,) 1000 MCG/ML injection Inject 1 mL (1,000 mcg total) into the muscle every 30  (thirty) days. Patient not taking: Reported on 03/29/2020 06/05/19   Philip Aspen, Limmie Patricia, MD  Dulaglutide (TRULICITY) 0.75 MG/0.5ML SOPN Inject 0.75 mg into the skin once a week. Patient not taking: Reported on 03/29/2020 01/07/20   Philip Aspen, Limmie Patricia, MD  SYRINGE-NEEDLE, DISP, 3 ML (BD SAFETYGLIDE SYRINGE/NEEDLE) 25G X 1" 3 ML MISC Use for B12 injections 01/07/20   Philip Aspen, Limmie Patricia, MD  traMADol (ULTRAM) 50 MG tablet Take 1 tablet (50 mg total) by mouth every 12 (twelve) hours as needed for moderate pain. Patient not taking: Reported on 03/29/2020 01/07/20   Philip Aspen, Limmie Patricia, MD  zolpidem (AMBIEN) 5 MG tablet Take 1 tablet (5 mg total) by mouth at bedtime as needed for sleep. Patient not taking: Reported on 03/29/2020 01/07/20   Philip Aspen, Limmie Patricia, MD    Inpatient Medications: Scheduled Meds: . atorvastatin  40 mg Oral QHS  . docusate sodium  100 mg Oral BID  . enoxaparin (LOVENOX) injection  40 mg Subcutaneous Q24H  . gabapentin  300 mg Oral TID  . insulin aspart  0-9 Units Subcutaneous Q4H  . PARoxetine  40 mg Oral Daily  . sodium chloride flush  3 mL Intravenous Q12H   Continuous Infusions:  PRN Meds: acetaminophen **OR** acetaminophen, HYDROcodone-acetaminophen, ondansetron **OR** ondansetron (ZOFRAN) IV  Allergies:    Allergies  Allergen Reactions  . Sulfa Antibiotics     Blood in Urine    Social History:   Social History   Socioeconomic History  . Marital status: Single    Spouse name: Not on file  . Number of children: Not on file  . Years of education: Not on file  . Highest education level: Not on file  Occupational History  . Not on file  Tobacco Use  . Smoking status: Never Smoker  . Smokeless tobacco: Never Used  Substance and Sexual Activity  . Alcohol use: Never  . Drug use: Never  . Sexual activity: Not on file  Other Topics Concern  . Not on file  Social History Narrative  . Not on file   Social Determinants of  Health   Financial Resource Strain:   . Difficulty of Paying Living Expenses:   Food Insecurity:   . Worried About Programme researcher, broadcasting/film/video in the Last Year:   . Barista in the Last Year:   Transportation Needs:   . Freight forwarder (Medical):   Marland Kitchen Lack of Transportation (Non-Medical):   Physical Activity:   . Days of Exercise per Week:   . Minutes of Exercise per Session:   Stress:   . Feeling of Stress :   Social Connections:   . Frequency of Communication with Friends and Family:   . Frequency of Social Gatherings with Friends and Family:   . Attends Religious Services:   . Active Member of Clubs or Organizations:   . Attends Banker Meetings:   .  Marital Status:   Intimate Partner Violence:   . Fear of Current or Ex-Partner:   . Emotionally Abused:   Marland Kitchen Physically Abused:   . Sexually Abused:     Family History:    Family History  Problem Relation Age of Onset  . Diabetes Mother      ROS:  Please see the history of present illness.   All other ROS reviewed and negative.     Physical Exam/Data:   Vitals:   03/29/20 2141 03/29/20 2339 03/30/20 0354 03/30/20 1020  BP: (!) 148/93 (!) 152/92 109/62 (!) 144/90  Pulse: 69 66 60 62  Resp: 16 18  18   Temp: 97.6 F (36.4 C) 97.7 F (36.5 C) 97.7 F (36.5 C) 98.1 F (36.7 C)  TempSrc: Oral Oral Oral Oral  SpO2: 100% 98% 98% 100%  Weight:      Height:        Intake/Output Summary (Last 24 hours) at 03/30/2020 1357 Last data filed at 03/30/2020 0500 Gross per 24 hour  Intake 1941.75 ml  Output 800 ml  Net 1141.75 ml   Last 3 Weights 03/29/2020 01/09/2020 01/07/2020  Weight (lbs) 184 lb 193 lb 198 lb 14.4 oz  Weight (kg) 83.462 kg 87.544 kg 90.22 kg     Body mass index is 23.62 kg/m.  General:  Well nourished, well developed, in no acute distressse HEENT: normal Neck: no JVD Vascular: No carotid bruits Cardiac:  normal S1, S2; RRR; no murmur  Lungs:  clear to auscultation bilaterally,  no wheezing, rhonchi or rales  Abd: soft, nontender, no hepatomegaly  Ext: no edema Musculoskeletal:  No deformities, BUE and BLE strength normal and equal Skin: warm and dry  Neuro:  CNs 2-12 intact, no focal abnormalities noted Psych:  Normal affect   EKG:  The EKG was personally reviewed and demonstrates:  Sinus bradycardia with HR 58, first degree heart block, LBBB Telemetry:  Telemetry was personally reviewed and demonstrates:  Sinus rhythm with a bout of what appears to be artifact, but can't rule out a tachcyardia  Relevant CV Studies:  Echo pending  Laboratory Data:  High Sensitivity Troponin:   Recent Labs  Lab 03/29/20 1440 03/29/20 1639 03/30/20 0510  TROPONINIHS 22* 21* 34*     Chemistry Recent Labs  Lab 03/29/20 1425 03/30/20 0510  NA 137 138  K 3.8 3.4*  CL 103 104  CO2 20* 24  GLUCOSE 340* 154*  BUN 20 17  CREATININE 1.25* 1.00  CALCIUM 9.2 8.6*  GFRNONAA 56* >60  GFRAA >60 >60  ANIONGAP 14 10    Recent Labs  Lab 03/30/20 0510  PROT 5.7*  ALBUMIN 3.6  AST 14*  ALT 16  ALKPHOS 49  BILITOT 0.8   Hematology Recent Labs  Lab 03/29/20 1440 03/30/20 0510  WBC 5.7 8.4  RBC 4.86 4.32  HGB 14.5 12.8*  HCT 41.1 36.9*  MCV 84.6 85.4  MCH 29.8 29.6  MCHC 35.3 34.7  RDW 13.1 13.2  PLT 232 211   BNPNo results for input(s): BNP, PROBNP in the last 168 hours.  DDimer  Recent Labs  Lab 03/29/20 1440  DDIMER 0.75*     Radiology/Studies:  CT Head Wo Contrast  Result Date: 03/29/2020 CLINICAL DATA:  79 year old male with syncope. EXAM: CT HEAD WITHOUT CONTRAST TECHNIQUE: Contiguous axial images were obtained from the base of the skull through the vertex without intravenous contrast. COMPARISON:  11/27/2008 CT FINDINGS: Brain: No evidence of acute infarction, hemorrhage, hydrocephalus, extra-axial collection  or mass lesion/mass effect. Atrophy and chronic small-vessel white matter ischemic changes are again noted. Vascular: Carotid and  vertebral atherosclerotic calcifications are noted. Skull: Normal. Negative for fracture or focal lesion. Sinuses/Orbits: No acute finding. Other: None. IMPRESSION: 1. No evidence of acute intracranial abnormality. 2. Atrophy and chronic small-vessel white matter ischemic changes. Electronically Signed   By: Harmon Pier M.D.   On: 03/29/2020 17:04   CT Angio Chest PE W and/or Wo Contrast  Result Date: 03/29/2020 CLINICAL DATA:  Diaphoresis, dizziness, loss of consciousness EXAM: CT ANGIOGRAPHY CHEST WITH CONTRAST TECHNIQUE: Multidetector CT imaging of the chest was performed using the standard protocol during bolus administration of intravenous contrast. Multiplanar CT image reconstructions and MIPs were obtained to evaluate the vascular anatomy. CONTRAST:  OMNIPAQUE IOHEXOL 350 MG/ML SOLN COMPARISON:  03/29/2020 FINDINGS: Cardiovascular: This is a technically adequate evaluation of the pulmonary vasculature. No filling defects or pulmonary emboli. The heart is enlarged with mild left ventricular dilatation. There is moderate atherosclerosis throughout the coronary vasculature greatest in the LAD distribution. Mediastinum/Nodes: No enlarged mediastinal, hilar, or axillary lymph nodes. Thyroid gland, trachea, and esophagus demonstrate no significant findings. Lungs/Pleura: No acute airspace disease, effusion, or pneumothorax. Central airways are patent. Upper Abdomen: Calcified gallstones are identified without cholecystitis. Right lobe liver cyst is noted. No acute upper abdominal findings otherwise. Musculoskeletal: No acute or destructive bony lesions. Reconstructed images demonstrate no additional findings. Review of the MIP images confirms the above findings. IMPRESSION: 1. No evidence of pulmonary embolus. 2. Cardiomegaly with left ventricular dilatation. 3. Moderate coronary artery atherosclerosis. 4. Cholelithiasis without cholecystitis. Electronically Signed   By: Sharlet Salina M.D.   On:  03/29/2020 17:01   DG Chest Portable 1 View  Result Date: 03/29/2020 CLINICAL DATA:  Syncopal episode.  Chest pain.  Diabetes. EXAM: PORTABLE CHEST 1 VIEW COMPARISON:  11/27/2008 FINDINGS: The heart size and mediastinal contours are within normal limits. Both lungs are clear. The visualized skeletal structures are unremarkable. IMPRESSION: No active disease. Electronically Signed   By: Danae Orleans M.D.   On: 03/29/2020 15:03   VAS US CAROTID  Result Date: 03/30/2020 Carotid Arterial Duplex Study Indications:       Syncope. Risk Factors:      Hypertension, hyperlipidemia, Diabetes. Comparison Study:  No prior studies. Performing Technologist: Jean Rosenthal  Examination Guidelines: A complete evaluation includes B-mode imaging, spectral Doppler, color Doppler, and power Doppler as needed of all accessible portions of each vessel. Bilateral testing is considered an integral part of a complete examination. Limited examinations for reoccurring indications may be performed as noted.  Right Carotid Findings: +----------+--------+--------+--------+------------------+------------------+           PSV cm/sEDV cm/sStenosisPlaque DescriptionComments           +----------+--------+--------+--------+------------------+------------------+ CCA Prox  62      9                                 intimal thickening +----------+--------+--------+--------+------------------+------------------+ CCA Distal58      9                                 intimal thickening +----------+--------+--------+--------+------------------+------------------+ ICA Prox  40      10      1-39%                                        +----------+--------+--------+--------+------------------+------------------+  ICA Distal26      9                                                    +----------+--------+--------+--------+------------------+------------------+ ECA       71      9                                                     +----------+--------+--------+--------+------------------+------------------+ +----------+--------+-------+----------------+-------------------+           PSV cm/sEDV cmsDescribe        Arm Pressure (mmHG) +----------+--------+-------+----------------+-------------------+ [id number]             Multiphasic, WNL                    +----------+--------+-------+----------------+-------------------+ +---------+--------+--+--------+-+---------+ VertebralPSV cm/s25EDV cm/s7Antegrade +---------+--------+--+--------+-+---------+  Left Carotid Findings: +----------+--------+--------+--------+------------------+------------------+           PSV cm/sEDV cm/sStenosisPlaque DescriptionComments           +----------+--------+--------+--------+------------------+------------------+ CCA Prox  59      11                                intimal thickening +----------+--------+--------+--------+------------------+------------------+ CCA Distal63      17                                intimal thickening +----------+--------+--------+--------+------------------+------------------+ ICA Prox  42      11      1-39%                                        +----------+--------+--------+--------+------------------+------------------+ ICA Distal61      17                                                   +----------+--------+--------+--------+------------------+------------------+ ECA       77      9                                                    +----------+--------+--------+--------+------------------+------------------+ +----------+--------+--------+----------------+-------------------+           PSV cm/sEDV cm/sDescribe        Arm Pressure (mmHG) +----------+--------+--------+----------------+-------------------+ [id number]              Multiphasic, WNL                    +----------+--------+--------+----------------+-------------------+  +---------+--------+--+--------+--+---------+ VertebralPSV cm/s44EDV cm/s11Antegrade +---------+--------+--+--------+--+---------+   Summary: Right Carotid: Velocities in the right ICA are consistent with a 1-39% stenosis. Left Carotid: Velocities in the left ICA are consistent with a 1-39% stenosis. Vertebrals:  Bilateral vertebral arteries demonstrate antegrade flow. Subclavians: Normal flow hemodynamics were seen in  bilateral subclavian              arteries. *See table(s) above for measurements and observations.     Preliminary    {  Assessment and Plan:   Syncope - pt has had two episodes of syncope in  - carotid artery duplex with mild nonobstructive disease - head CT without acute abnormalities - EKG as below - given that he has an episode about once per year and always in the summer, may be a better candidate for ILR vs 30 day event monitor - will coordinate with EP to see if ILR can be placed tomorrow at [facility]   LBBB First degree AV block - unclear if this is a new finding - echo pending    Mildly elevated troponin - hs troponin 22 --> 21 --> 34 - pt denies chest pain - coronary atherosclerosis on CT chest - will plan for nuclear stress test tomorrow  - NPO at midnight   Diabetes - uncontrolled - A1c 10.4% - diabetes coordination has been consulted   Hyperlipidemia with LDL goal < 70 - coronary atherosclerosis on CT chest - continue lipitor 40 mg 01/07/2020: Cholesterol 129; HDL 30.00; Triglycerides 284.0; VLDL 56.8  - will update lipid panel      For questions or updates, please contact [facility] Please consult [email or web address] for contact info under    Signed, Marcelino Duster, [place]  03/30/2020 1:57 PM

## 2020-03-30 NOTE — Progress Notes (Signed)
Carotid duplex bilateral study completed.   See Cv Proc for preliminary results.   Ryan [name]  

## 2020-03-30 NOTE — Progress Notes (Signed)
Inpatient Diabetes Program Recommendations  AACE/ADA: New Consensus Statement on Inpatient Glycemic Control (2015)  Target Ranges:  Prepandial:   less than 140 mg/dL      Peak postprandial:   less than 180 mg/dL (1-2 hours)      Critically ill patients:  140 - 180 mg/dL   Lab Results  Component Value Date   GLUCAP 145 (H) 03/30/2020   HGBA1C 10.4 (H) 03/29/2020    Review of Glycemic Control  Diabetes history: DM 2 Outpatient Diabetes medications: Amaryl 2 mg Daily, Metformin 1000 mg bid Current orders for Inpatient glycemic control:  Novolog 0-9 units Q4 hours  Inpatient Diabetes Program Recommendations:    -glucose meter kit at time of d/c order #[id number]  Spoke with pt at bedside regarding A1c level 10.4% this admission. At PCP visit on 01/07/20 A1c was 10.8%. Pt sees Dr. Jerilee Hoh outpatient for DM management. Pt had been placed on GLP-1 injectable once a week took one dose and said "it was not for me." Pt checks glucose 2-3 times a week after lunch and has noticed higher trends.  D/c: Pt has been out of Amaryl for 1 month. -  Recommend pt restart Amaryl follow up with Dr. Jerilee Hoh in 1 month after restart of Metformin for adjustments.  -  Pt to check glucose 2 times a day until appointment with PCP.  Thanks,  Tama Headings RN, MSN, BC-ADM Inpatient Diabetes Coordinator Team Pager [phone] (8a-5p)

## 2020-03-30 NOTE — Progress Notes (Signed)
Patients 3rd troponin and 3rd lactic acid due at 0200 not collected. Lab currently drawing labs. On call NP notified.

## 2020-03-30 NOTE — Progress Notes (Signed)
*  PRELIMINARY RESULTS* Echocardiogram 2D Echocardiogram has been performed.  Pieter Partridge 03/30/2020, 2:42 PM

## 2020-03-30 NOTE — Progress Notes (Signed)
PT Cancellation Note / Screen  Patient Details Name: Ryan [name] MRN: [id number] DOB: 14-May-1941   Cancelled Treatment:    Reason Eval/Treat Not Completed: PT screened, no needs identified, will sign off Pt reports cardiologist plans to do stress test tomorrow and he believes syncope episodes are medical in nature.  Pt declined need for acute PT at this time, however aware to request new consult or referral if required after f/u with cardiology.  PT to sign off.   Ryan [name],Ryan [name] 03/30/2020, 3:41 PM Thomasene Mohair PT, DPT Acute Rehabilitation Services Pager: [phone] Office: [phone]

## 2020-03-31 ENCOUNTER — Encounter (HOSPITAL_COMMUNITY)
Admission: EM | Disposition: A | Discharge status: Home / Self Care | Payer: Self-pay | Source: Home / Self Care | Attending: Internal Medicine

## 2020-03-31 DIAGNOSIS — I440 Atrioventricular block, first degree: Secondary | ICD-10-CM | POA: Diagnosis present

## 2020-03-31 DIAGNOSIS — E1165 Type 2 diabetes mellitus with hyperglycemia: Secondary | ICD-10-CM | POA: Diagnosis present

## 2020-03-31 DIAGNOSIS — I10 Essential (primary) hypertension: Secondary | ICD-10-CM | POA: Diagnosis not present

## 2020-03-31 DIAGNOSIS — I5021 Acute systolic (congestive) heart failure: Secondary | ICD-10-CM | POA: Diagnosis not present

## 2020-03-31 DIAGNOSIS — I77810 Thoracic aortic ectasia: Secondary | ICD-10-CM | POA: Diagnosis present

## 2020-03-31 DIAGNOSIS — I5041 Acute combined systolic (congestive) and diastolic (congestive) heart failure: Secondary | ICD-10-CM | POA: Diagnosis present

## 2020-03-31 DIAGNOSIS — I25110 Atherosclerotic heart disease of native coronary artery with unstable angina pectoris: Secondary | ICD-10-CM | POA: Diagnosis not present

## 2020-03-31 DIAGNOSIS — E7800 Pure hypercholesterolemia, unspecified: Secondary | ICD-10-CM

## 2020-03-31 DIAGNOSIS — I272 Pulmonary hypertension, unspecified: Secondary | ICD-10-CM | POA: Diagnosis present

## 2020-03-31 DIAGNOSIS — F411 Generalized anxiety disorder: Secondary | ICD-10-CM | POA: Diagnosis present

## 2020-03-31 DIAGNOSIS — N401 Enlarged prostate with lower urinary tract symptoms: Secondary | ICD-10-CM | POA: Diagnosis not present

## 2020-03-31 DIAGNOSIS — E538 Deficiency of other specified B group vitamins: Secondary | ICD-10-CM | POA: Diagnosis present

## 2020-03-31 DIAGNOSIS — I110 Hypertensive heart disease with heart failure: Secondary | ICD-10-CM | POA: Diagnosis present

## 2020-03-31 DIAGNOSIS — N400 Enlarged prostate without lower urinary tract symptoms: Secondary | ICD-10-CM | POA: Diagnosis present

## 2020-03-31 DIAGNOSIS — Z23 Encounter for immunization: Secondary | ICD-10-CM | POA: Diagnosis present

## 2020-03-31 DIAGNOSIS — E1142 Type 2 diabetes mellitus with diabetic polyneuropathy: Secondary | ICD-10-CM | POA: Diagnosis present

## 2020-03-31 DIAGNOSIS — I447 Left bundle-branch block, unspecified: Secondary | ICD-10-CM | POA: Diagnosis present

## 2020-03-31 DIAGNOSIS — E559 Vitamin D deficiency, unspecified: Secondary | ICD-10-CM | POA: Diagnosis present

## 2020-03-31 DIAGNOSIS — R001 Bradycardia, unspecified: Secondary | ICD-10-CM | POA: Diagnosis present

## 2020-03-31 DIAGNOSIS — I951 Orthostatic hypotension: Secondary | ICD-10-CM | POA: Diagnosis present

## 2020-03-31 DIAGNOSIS — E860 Dehydration: Secondary | ICD-10-CM | POA: Diagnosis present

## 2020-03-31 DIAGNOSIS — E119 Type 2 diabetes mellitus without complications: Secondary | ICD-10-CM | POA: Diagnosis not present

## 2020-03-31 DIAGNOSIS — E785 Hyperlipidemia, unspecified: Secondary | ICD-10-CM | POA: Diagnosis present

## 2020-03-31 DIAGNOSIS — I517 Cardiomegaly: Secondary | ICD-10-CM

## 2020-03-31 DIAGNOSIS — Z833 Family history of diabetes mellitus: Secondary | ICD-10-CM | POA: Diagnosis not present

## 2020-03-31 DIAGNOSIS — E876 Hypokalemia: Secondary | ICD-10-CM | POA: Diagnosis present

## 2020-03-31 DIAGNOSIS — N179 Acute kidney failure, unspecified: Secondary | ICD-10-CM | POA: Diagnosis present

## 2020-03-31 DIAGNOSIS — Z7984 Long term (current) use of oral hypoglycemic drugs: Secondary | ICD-10-CM | POA: Diagnosis not present

## 2020-03-31 DIAGNOSIS — R778 Other specified abnormalities of plasma proteins: Secondary | ICD-10-CM | POA: Diagnosis not present

## 2020-03-31 DIAGNOSIS — Z20822 Contact with and (suspected) exposure to covid-19: Secondary | ICD-10-CM | POA: Diagnosis present

## 2020-03-31 DIAGNOSIS — I421 Obstructive hypertrophic cardiomyopathy: Secondary | ICD-10-CM | POA: Diagnosis present

## 2020-03-31 DIAGNOSIS — I2510 Atherosclerotic heart disease of native coronary artery without angina pectoris: Secondary | ICD-10-CM

## 2020-03-31 DIAGNOSIS — R55 Syncope and collapse: Secondary | ICD-10-CM | POA: Diagnosis present

## 2020-03-31 HISTORY — PX: RIGHT/LEFT HEART CATH AND CORONARY ANGIOGRAPHY: CATH118266

## 2020-03-31 LAB — POCT I-STAT 7, (LYTES, BLD GAS, ICA,H+H)
Acid-base deficit: 2 mmol/L (ref 0.0–2.0)
Bicarbonate: 23.5 mmol/L (ref 20.0–28.0)
Calcium, Ion: 1.26 mmol/L (ref 1.15–1.40)
HCT: 37 % — ABNORMAL LOW (ref 39.0–52.0)
Hemoglobin: 12.6 g/dL — ABNORMAL LOW (ref 13.0–17.0)
O2 Saturation: 96 %
Potassium: 3.5 mmol/L (ref 3.5–5.1)
Sodium: 143 mmol/L (ref 135–145)
TCO2: 25 mmol/L (ref 22–32)
pCO2 arterial: 42.7 mmHg (ref 32.0–48.0)
pH, Arterial: 7.348 — ABNORMAL LOW (ref 7.350–7.450)
pO2, Arterial: 85 mmHg (ref 83.0–108.0)

## 2020-03-31 LAB — BASIC METABOLIC PANEL
Anion gap: 7 (ref 5–15)
BUN: 16 mg/dL (ref 8–23)
CO2: 28 mmol/L (ref 22–32)
Calcium: 8.9 mg/dL (ref 8.9–10.3)
Chloride: 104 mmol/L (ref 98–111)
Creatinine, Ser: 1.1 mg/dL (ref 0.61–1.24)
GFR calc Af Amer: >60 mL/min (ref >60)
GFR calc non Af Amer: >60 mL/min (ref >60)
Glucose, Bld: 175 mg/dL — ABNORMAL HIGH (ref 70–99)
Potassium: 3.6 mmol/L (ref 3.5–5.1)
Sodium: 139 mmol/L (ref 135–145)

## 2020-03-31 LAB — POCT I-STAT EG7
Acid-base deficit: 1 mmol/L (ref 0.0–2.0)
Bicarbonate: 25.2 mmol/L (ref 20.0–28.0)
Calcium, Ion: 1.24 mmol/L (ref 1.15–1.40)
HCT: 36 % — ABNORMAL LOW (ref 39.0–52.0)
Hemoglobin: 12.2 g/dL — ABNORMAL LOW (ref 13.0–17.0)
O2 Saturation: 73 %
Potassium: 3.5 mmol/L (ref 3.5–5.1)
Sodium: 143 mmol/L (ref 135–145)
TCO2: 27 mmol/L (ref 22–32)
pCO2, Ven: 47.2 mmHg (ref 44.0–60.0)
pH, Ven: 7.335 (ref 7.250–7.430)
pO2, Ven: 41 mmHg (ref 32.0–45.0)

## 2020-03-31 LAB — CBC
HCT: 36.4 % — ABNORMAL LOW (ref 39.0–52.0)
HCT: 37.1 % — ABNORMAL LOW (ref 39.0–52.0)
Hemoglobin: 12.5 g/dL — ABNORMAL LOW (ref 13.0–17.0)
Hemoglobin: 12.7 g/dL — ABNORMAL LOW (ref 13.0–17.0)
MCH: 29.1 pg (ref 26.0–34.0)
MCH: 29.4 pg (ref 26.0–34.0)
MCHC: 34.3 g/dL (ref 30.0–36.0)
MCHC: 34.2 g/dL (ref 30.0–36.0)
MCV: 84.8 fL (ref 80.0–100.0)
MCV: 85.9 fL (ref 80.0–100.0)
Platelets: 202 10*3/uL (ref 150–400)
Platelets: 196 10*3/uL (ref 150–400)
RBC: 4.29 MIL/uL (ref 4.22–5.81)
RBC: 4.32 MIL/uL (ref 4.22–5.81)
RDW: 13.2 % (ref 11.5–15.5)
RDW: 13.2 % (ref 11.5–15.5)
WBC: 7.6 10*3/uL (ref 4.0–10.5)
WBC: 7 10*3/uL (ref 4.0–10.5)
nRBC: 0 % (ref 0.0–0.2)
nRBC: 0 % (ref 0.0–0.2)

## 2020-03-31 LAB — CREATININE, SERUM
Creatinine, Ser: 1.13 mg/dL (ref 0.61–1.24)
GFR calc Af Amer: >60 mL/min (ref >60)
GFR calc non Af Amer: >60 mL/min (ref >60)

## 2020-03-31 LAB — GLUCOSE, CAPILLARY
Glucose-Capillary: 192 mg/dL — ABNORMAL HIGH (ref 70–99)
Glucose-Capillary: 171 mg/dL — ABNORMAL HIGH (ref 70–99)
Glucose-Capillary: 182 mg/dL — ABNORMAL HIGH (ref 70–99)
Glucose-Capillary: 286 mg/dL — ABNORMAL HIGH (ref 70–99)

## 2020-03-31 LAB — MRSA PCR SCREENING: MRSA by PCR: NEGATIVE

## 2020-03-31 LAB — MAGNESIUM: Magnesium: 2 mg/dL (ref 1.7–2.4)

## 2020-03-31 LAB — LIPID PANEL
Cholesterol: 131 mg/dL (ref 0–200)
HDL: 27 mg/dL — ABNORMAL LOW (ref >40)
LDL Cholesterol: 60 mg/dL (ref 0–99)
Total CHOL/HDL Ratio: 4.9 RATIO
Triglycerides: 220 mg/dL — ABNORMAL HIGH (ref <150)
VLDL: 44 mg/dL — ABNORMAL HIGH (ref 0–40)

## 2020-03-31 SURGERY — RIGHT/LEFT HEART CATH AND CORONARY ANGIOGRAPHY
Anesthesia: LOCAL

## 2020-03-31 MED ORDER — SODIUM CHLORIDE 0.9% FLUSH: 3.0000 mL | Freq: Two times a day (BID) | INTRAVENOUS | Status: DC

## 2020-03-31 MED ORDER — SODIUM CHLORIDE 0.9 % IV SOLN: 250.0000 mL | INTRAVENOUS | Status: DC | PRN

## 2020-03-31 MED ORDER — HEPARIN SODIUM (PORCINE) 1000 UNIT/ML IJ SOLN
INTRAMUSCULAR | Status: DC | PRN
  Administered 2020-03-31: 4000 [IU] via INTRAVENOUS

## 2020-03-31 MED ORDER — ZOLPIDEM TARTRATE 5 MG PO TABS
5.0000 mg | ORAL_TABLET | Freq: Every evening | ORAL | Status: DC | PRN
  Administered 2020-03-31 – 2020-04-02 (×3): 5 mg via ORAL
  Filled 2020-03-31 (×3): qty 1

## 2020-03-31 MED ORDER — VERAPAMIL HCL 2.5 MG/ML IV SOLN
INTRAVENOUS | Status: DC | PRN
  Administered 2020-03-31: 10 mL via INTRA_ARTERIAL

## 2020-03-31 MED ORDER — IOHEXOL 350 MG/ML SOLN
INTRAVENOUS | Status: DC | PRN
  Administered 2020-03-31: 80 mL via INTRA_ARTERIAL

## 2020-03-31 MED ORDER — HEPARIN (PORCINE) IN NACL 1000-0.9 UT/500ML-% IV SOLN
INTRAVENOUS | Status: AC
  Filled 2020-03-31: qty 1000

## 2020-03-31 MED ORDER — HYDRALAZINE HCL 20 MG/ML IJ SOLN
INTRAMUSCULAR | Status: AC
  Filled 2020-03-31: qty 1

## 2020-03-31 MED ORDER — FENTANYL CITRATE (PF) 100 MCG/2ML IJ SOLN
INTRAMUSCULAR | Status: AC
  Filled 2020-03-31: qty 2

## 2020-03-31 MED ORDER — SODIUM CHLORIDE 0.9 % IV SOLN: INTRAVENOUS | Status: AC

## 2020-03-31 MED ORDER — LIDOCAINE HCL (PF) 1 % IJ SOLN
INTRAMUSCULAR | Status: DC | PRN
  Administered 2020-03-31 (×2): 2 mL

## 2020-03-31 MED ORDER — ASPIRIN 81 MG PO CHEW
81.0000 mg | CHEWABLE_TABLET | Freq: Every day | ORAL | Status: DC
  Administered 2020-04-01 – 2020-04-03 (×3): 81 mg via ORAL
  Filled 2020-03-31 (×3): qty 1

## 2020-03-31 MED ORDER — SPIRONOLACTONE 12.5 MG HALF TABLET
12.5000 mg | ORAL_TABLET | Freq: Every day | ORAL | Status: DC
  Administered 2020-03-31 – 2020-04-03 (×4): 12.5 mg via ORAL
  Filled 2020-03-31 (×5): qty 1

## 2020-03-31 MED ORDER — MIDAZOLAM HCL 2 MG/2ML IJ SOLN
INTRAMUSCULAR | Status: DC | PRN
  Administered 2020-03-31 (×2): 1 mg via INTRAVENOUS

## 2020-03-31 MED ORDER — SODIUM CHLORIDE 0.9% FLUSH
3.0000 mL | Freq: Two times a day (BID) | INTRAVENOUS | Status: DC
  Administered 2020-04-01 – 2020-04-03 (×4): 3 mL via INTRAVENOUS

## 2020-03-31 MED ORDER — SODIUM CHLORIDE 0.9% FLUSH: 3.0000 mL | INTRAVENOUS | Status: DC | PRN

## 2020-03-31 MED ORDER — SODIUM CHLORIDE 0.9 % IV SOLN: INTRAVENOUS | Status: DC

## 2020-03-31 MED ORDER — INSULIN ASPART 100 UNIT/ML ~~LOC~~ SOLN
SUBCUTANEOUS | Status: AC
  Filled 2020-03-31: qty 1

## 2020-03-31 MED ORDER — OXYCODONE HCL 5 MG PO TABS: 5.0000 mg | ORAL_TABLET | ORAL | Status: DC | PRN

## 2020-03-31 MED ORDER — MIDAZOLAM HCL 2 MG/2ML IJ SOLN
INTRAMUSCULAR | Status: AC
  Filled 2020-03-31: qty 2

## 2020-03-31 MED ORDER — ENOXAPARIN SODIUM 40 MG/0.4ML ~~LOC~~ SOLN
40.0000 mg | SUBCUTANEOUS | Status: DC
  Administered 2020-04-01 – 2020-04-03 (×3): 40 mg via SUBCUTANEOUS
  Filled 2020-03-31 (×3): qty 0.4

## 2020-03-31 MED ORDER — LIDOCAINE HCL (PF) 1 % IJ SOLN
INTRAMUSCULAR | Status: AC
  Filled 2020-03-31: qty 30

## 2020-03-31 MED ORDER — ACETAMINOPHEN 325 MG PO TABS: 650.0000 mg | ORAL_TABLET | ORAL | Status: DC | PRN

## 2020-03-31 MED ORDER — LOSARTAN POTASSIUM 50 MG PO TABS
50.0000 mg | ORAL_TABLET | Freq: Every day | ORAL | Status: DC
  Administered 2020-04-01 – 2020-04-03 (×3): 50 mg via ORAL
  Filled 2020-03-31 (×3): qty 1

## 2020-03-31 MED ORDER — FENTANYL CITRATE (PF) 100 MCG/2ML IJ SOLN
INTRAMUSCULAR | Status: DC | PRN
  Administered 2020-03-31 (×2): 25 ug via INTRAVENOUS

## 2020-03-31 MED ORDER — HYDRALAZINE HCL 20 MG/ML IJ SOLN: 10.0000 mg | INTRAMUSCULAR | Status: AC | PRN

## 2020-03-31 MED ORDER — HEPARIN SODIUM (PORCINE) 1000 UNIT/ML IJ SOLN
INTRAMUSCULAR | Status: AC
  Filled 2020-03-31: qty 1

## 2020-03-31 MED ORDER — ASPIRIN 81 MG PO CHEW
81.0000 mg | CHEWABLE_TABLET | ORAL | Status: AC
  Administered 2020-03-31: 81 mg via ORAL
  Filled 2020-03-31: qty 1

## 2020-03-31 MED ORDER — HEPARIN (PORCINE) IN NACL 1000-0.9 UT/500ML-% IV SOLN
INTRAVENOUS | Status: DC | PRN
  Administered 2020-03-31 (×2): 500 mL

## 2020-03-31 MED ORDER — VERAPAMIL HCL 2.5 MG/ML IV SOLN
INTRAVENOUS | Status: AC
  Filled 2020-03-31: qty 2

## 2020-03-31 MED ORDER — LABETALOL HCL 5 MG/ML IV SOLN: 10.0000 mg | INTRAVENOUS | Status: AC | PRN

## 2020-03-31 MED ORDER — ONDANSETRON HCL 4 MG/2ML IJ SOLN: 4.0000 mg | Freq: Four times a day (QID) | INTRAMUSCULAR | Status: DC | PRN

## 2020-03-31 SURGICAL SUPPLY — 14 items
CATH 5FR JL3.5 JR4 ANG PIG MP (CATHETERS) ×1 IMPLANT
CATH BALLN WEDGE 5F 110CM (CATHETERS) ×1 IMPLANT
DEVICE RAD COMP TR BAND LRG (VASCULAR PRODUCTS) ×1 IMPLANT
ELECT DEFIB PAD ADLT CADENCE (PAD) ×1 IMPLANT
GLIDESHEATH SLEND A-KIT 6F 22G (SHEATH) ×1 IMPLANT
GUIDEWIRE .025 260CM (WIRE) ×1 IMPLANT
GUIDEWIRE INQWIRE 1.5J.035X260 (WIRE) IMPLANT
INQWIRE 1.5J .035X260CM (WIRE) ×2
KIT HEART LEFT (KITS) ×2 IMPLANT
PACK CARDIAC CATHETERIZATION (CUSTOM PROCEDURE TRAY) ×2 IMPLANT
SHEATH GLIDE SLENDER 4/5FR (SHEATH) ×1 IMPLANT
SHEATH PROBE COVER 6X72 (BAG) ×1 IMPLANT
TRANSDUCER W/STOPCOCK (MISCELLANEOUS) ×2 IMPLANT
TUBING CIL FLEX 10 FLL-RA (TUBING) ×2 IMPLANT

## 2020-03-31 NOTE — CV Procedure (Signed)
·   Right heart cath via exchange of an antecubital vein IV using the modified Seldinger technique and left heart cath via right radial approach using real-time vascular ultrasound for access.  Severe global left ventricular hypokinesis with estimated EF 25%.  LVEDP 18 mmHg.  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  Pulmonary wedge pressure 16 mmHg mean  Widely patent left main  Severe diffuse multifocal proximal to distal LAD disease.  First diagonal is relatively large and contains proximal to mid 90% stenosis.  Circumflex gives origin to a large first obtuse marginal that becomes diffusely diseased in the mid segment and extends out into small branches with obstruction in the 80 to 90% range over this long segment.  Second obtuse marginal is totally occluded third obtuse marginal is relatively small and free of high-grade obstruction.  The right coronary is dominant.  There is mid to distal 50 to 60% stenosis.  PDA contains segmental 80 to 90% stenosis and thereafter is diffusely diseased, small, and not a target for bypass.  The continuation of the right coronary is subtotally occluded before a small left ventricular branch.  Overall impression: Ischemic cardiomyopathy.  Recommend consideration of a viability study and question whether there are surgical options if significant viable muscle.  Otherwise medical therapy.

## 2020-03-31 NOTE — Progress Notes (Signed)
We are asked to consider loop implant for this patient for recurrent syncope.  He is planned for [facility] later today. Chart has been reviewed D/w Dr. Ladona Ridgel, loop implant reasonable unless he has CAD that requires intervention. Discussed with cards APP this morning, she tells me they have discussed loop with the patient, implant and rational, he is on board. EP service will see him when he gets to [facility] in consult, I have asked he be consented ahead of time for possible loop.  Francis Dowse, PA_C

## 2020-03-31 NOTE — Interval H&P Note (Signed)
Cath Lab Visit (complete for each Cath Lab visit)  Clinical Evaluation Leading to the Procedure:   ACS: No.  Non-ACS:    Anginal Classification: No Symptoms  Anti-ischemic medical therapy: No Therapy  Non-Invasive Test Results: No non-invasive testing performed  Prior CABG: No previous CABG      History and Physical Interval Note:  03/31/2020 2:01 PM  Ryan [name] [name]  has presented today for surgery, with the diagnosis of left bundle - heart failure.  The various methods of treatment have been discussed with the patient and family. After consideration of risks, benefits and other options for treatment, the patient has consented to  Procedure(s): RIGHT/LEFT HEART CATH AND CORONARY ANGIOGRAPHY (N/A) as a surgical intervention.  The patient's history has been reviewed, patient examined, no change in status, stable for surgery.  I have reviewed the patient's chart and labs.  Questions were answered to the patient's satisfaction.     Lyn Records III

## 2020-03-31 NOTE — Consult Note (Addendum)
Cardiology Consultation:   Patient ID: Audon Heymann MRN: [id number]; DOB: [birth year]  Admit date: 03/29/2020 Date of Consult: 03/31/2020  Primary Care Provider: Philip Aspen, Limmie Patricia, MD [facility] Cardiologist: Armanda Magic, MD  [facility] Electrophysiologist:  New, Dr. Ladona Ridgel   Patient Profile:   Shann Merrick is a 79 y.o. male with a hx of HTN, HLD, DM, diabetic neuropathy, charcot foot b/l, syncope who is being seen today for the evaluation of syncope, consider loop implant at the request of Dr. Mayford Knife.  History of Present Illness:   Mr. Rohl reports hx of syncope that historically he has felt provoked by being overheated/hot environments.  He reports living in [place] and seeing a cardiologist here after his 1st fainting episode and being told he may need a pacer, though in follow up over the years decided he did not.  He reports being told of a small rhythm abnormality that was not felt to be worrisome.  He tells me he has fainted about once a year in the last 4 years This event he went with his son to [employer], he mentions was very hot outside, though they were in the store, had done their shopping and he was in line when he became weak, hot, sweaty and layed on the floor worried he was going to faint.  He had no CP, palpitations or cardiac awareness of any kind, no CP>  His son cae back to the line helped him up and upon standing fainted briefly.  They called EMS, he recalls the entire event.  He was still very weak when EMS got to him, they needed to help him to the stretcher   EMS record reviewed Found AAO x4, he was pale, cool and diaphoretic, strong pulses Vitals note normal to higher BPs, HR 60's-70's, sats 94-98 ECGs reviewed are SR 60's and 70's, 1st degree AVblock  Labs largely unremarkable, HS Trop 22, 21 Lactic acid initially 4.1 > 2.2 > 1.7 Mag 1.7 > 2.0 K+ 3.8 on arrival  Past Medical History:  Diagnosis Date  . B12  deficiency   . BPH (benign prostatic hyperplasia)   . DM (diabetes mellitus), type 2, uncontrolled w/neurologic complication (HCC)   . GAD (generalized anxiety disorder)   . HTN (hypertension)   . Hyperlipidemia   . Neuropathy, peripheral     History reviewed. No pertinent surgical history.   Home Medications:  Prior to Admission medications   Medication Sig Start Date End Date Taking? Authorizing Provider  atorvastatin (LIPITOR) 40 MG tablet TAKE ONE TABLET BY MOUTH EVERY NIGHT AT BEDTIME 03/17/20  Yes Philip Aspen, Limmie Patricia, MD  cyanocobalamin (,VITAMIN B-12,) 1000 MCG/ML injection inject 1 ml once a month 01/07/20  Yes Philip Aspen, Limmie Patricia, MD  gabapentin (NEURONTIN) 300 MG capsule TAKE ONE CAPSULE BY MOUTH THREE TIMES A DAY 12/17/19  Yes Philip Aspen, Limmie Patricia, MD  glimepiride (AMARYL) 2 MG tablet Take 1 tablet (2 mg total) by mouth 2 (two) times daily. 01/07/20  Yes Philip Aspen, Limmie Patricia, MD  lisinopril-hydrochlorothiazide (ZESTORETIC) 20-25 MG tablet TAKE ONE TABLET BY MOUTH DAILY 03/18/20  Yes Philip Aspen, Limmie Patricia, MD  metFORMIN (GLUCOPHAGE) 1000 MG tablet TAKE ONE TABLET BY MOUTH TWICE A DAY WITH MEALS 03/18/20  Yes Philip Aspen, Limmie Patricia, MD  PARoxetine (PAXIL) 40 MG tablet Take 1 tablet (40 mg total) by mouth every morning. 06/05/19  Yes Philip Aspen, Limmie Patricia, MD  tamsulosin (FLOMAX) 0.4 MG CAPS capsule Take 1  capsule (0.4 mg total) by mouth daily. 06/05/19  Yes Philip Aspen, Limmie Patricia, MD  cyanocobalamin (,VITAMIN B-12,) 1000 MCG/ML injection Inject 1 mL (1,000 mcg total) into the muscle every 30 (thirty) days. Patient not taking: Reported on 03/29/2020 06/05/19   Philip Aspen, Limmie Patricia, MD  Dulaglutide (TRULICITY) 0.75 MG/0.5ML SOPN Inject 0.75 mg into the skin once a week. Patient not taking: Reported on 03/29/2020 01/07/20   Philip Aspen, Limmie Patricia, MD  SYRINGE-NEEDLE, DISP, 3 ML (BD SAFETYGLIDE SYRINGE/NEEDLE) 25G X 1" 3 ML MISC Use for B12  injections 01/07/20   Philip Aspen, Limmie Patricia, MD  traMADol (ULTRAM) 50 MG tablet Take 1 tablet (50 mg total) by mouth every 12 (twelve) hours as needed for moderate pain. Patient not taking: Reported on 03/29/2020 01/07/20   Philip Aspen, Limmie Patricia, MD  zolpidem (AMBIEN) 5 MG tablet Take 1 tablet (5 mg total) by mouth at bedtime as needed for sleep. Patient not taking: Reported on 03/29/2020 01/07/20   Philip Aspen, Limmie Patricia, MD    Inpatient Medications: Scheduled Meds: . atorvastatin  40 mg Oral QHS  . docusate sodium  100 mg Oral BID  . enoxaparin (LOVENOX) injection  40 mg Subcutaneous Q24H  . gabapentin  300 mg Oral TID  . insulin aspart  0-9 Units Subcutaneous Q4H  . PARoxetine  40 mg Oral Daily  . sodium chloride flush  3 mL Intravenous Q12H  . sodium chloride flush  3 mL Intravenous Q12H   Continuous Infusions: . sodium chloride    . sodium chloride     PRN Meds: sodium chloride, acetaminophen **OR** acetaminophen, HYDROcodone-acetaminophen, ondansetron **OR** ondansetron (ZOFRAN) IV, sodium chloride flush  Allergies:    Allergies  Allergen Reactions  . Sulfa Antibiotics     Blood in Urine    Social History:   Social History   Socioeconomic History  . Marital status: Single    Spouse name: Not on file  . Number of children: Not on file  . Years of education: Not on file  . Highest education level: Not on file  Occupational History  . Not on file  Tobacco Use  . Smoking status: Never Smoker  . Smokeless tobacco: Never Used  Substance and Sexual Activity  . Alcohol use: Never  . Drug use: Never  . Sexual activity: Not on file  Other Topics Concern  . Not on file  Social History Narrative  . Not on file   Social Determinants of Health   Financial Resource Strain:   . Difficulty of Paying Living Expenses:   Food Insecurity:   . Worried About Programme researcher, broadcasting/film/video in the Last Year:   . Barista in the Last Year:   Transportation Needs:   .  Freight forwarder (Medical):   Marland Kitchen Lack of Transportation (Non-Medical):   Physical Activity:   . Days of Exercise per Week:   . Minutes of Exercise per Session:   Stress:   . Feeling of Stress :   Social Connections:   . Frequency of Communication with Friends and Family:   . Frequency of Social Gatherings with Friends and Family:   . Attends Religious Services:   . Active Member of Clubs or Organizations:   . Attends Banker Meetings:   Marland Kitchen Marital Status:   Intimate Partner Violence:   . Fear of Current or Ex-Partner:   . Emotionally Abused:   Marland Kitchen Physically Abused:   . Sexually Abused:  Family History:   Family History  Problem Relation Age of Onset  . Diabetes Mother      ROS:  Please see the history of present illness.  All other ROS reviewed and negative.     Physical Exam/Data:   Vitals:   03/30/20 1020 03/30/20 1820 03/30/20 2032 03/31/20 0459  BP: (!) 144/90 138/79 (!) 141/81 130/79  Pulse: 62 63 62 (!) 57  Resp: 18 18 20 20   Temp: 98.1 F (36.7 C) 98 F (36.7 C) 97.7 F (36.5 C) 97.9 F (36.6 C)  TempSrc: Oral Oral Oral Oral  SpO2: 100% 97% 96% 97%  Weight:      Height:        Intake/Output Summary (Last 24 hours) at 03/31/2020 1034 Last data filed at 03/31/2020 0459 Gross per 24 hour  Intake 240 ml  Output 1500 ml  Net -1260 ml   Last 3 Weights 03/29/2020 01/09/2020 01/07/2020  Weight (lbs) 184 lb 193 lb 198 lb 14.4 oz  Weight (kg) 83.462 kg 87.544 kg 90.22 kg     Body mass index is 23.62 kg/m.  General:  Well nourished, well developed, in no acute distress, somewhat disheveled apperance HEENT: normal Lymph: no adenopathy Neck: no JVD Endocrine:  No thryomegaly Vascular: No carotid bruits Cardiac: RRR; no murmurs, gallops or rubs Lungs:  CTA b/l, no wheezing, rhonchi or rales  Abd: soft, nontender  Ext: no edema Musculoskeletal:  No deformities Skin: warm and dry  Neuro:  no focal abnormalities noted Psych:  Normal affect    EKG:  The EKG was personally reviewed and demonstrates:   SB 58bpm, 1st degree AVblock, PR 252ms, IVCD, LBBB  Telemetry:  Telemetry was personally reviewed and demonstrates:   In cath lab holding, SB 50's, 1st degree AVblock, occ PVCs (cardiology APP note today reports "Sinus-sinus bradycardia in the 50-60s with PVCs "  Relevant CV Studies:  Echo 03/30/20: 1. Left ventricular ejection fraction, by estimation, is 20 to 25%. The  left ventricle has severely decreased function. The left ventricle  demonstrates global hypokinesis. The left ventricular internal cavity size  was moderately dilated. There is  moderate left ventricular hypertrophy. Left ventricular diastolic  parameters are consistent with Grade II diastolic dysfunction  (pseudonormalization). Elevated left atrial pressure.  2. Right ventricular systolic function is normal. The right ventricular  size is normal.  3. The mitral valve is normal in structure. Mild mitral valve  regurgitation. No evidence of mitral stenosis.  4. The aortic valve is tricuspid. Aortic valve regurgitation is not  visualized. Mild aortic valve sclerosis is present, with no evidence of  aortic valve stenosis.  5. Aortic dilatation noted. There is mild dilatation of the aortic root  measuring 38 mm.    Laboratory Data:  High Sensitivity Troponin:   Recent Labs  Lab 03/29/20 1440 03/29/20 1639 03/30/20 0510  TROPONINIHS 22* 21* 34*     Chemistry Recent Labs  Lab 03/29/20 1425 03/30/20 0510 03/31/20 0436  NA 137 138 139  K 3.8 3.4* 3.6  CL 103 104 104  CO2 20* 24 28  GLUCOSE 340* 154* 175*  BUN 20 17 16   CREATININE 1.25* 1.00 1.10  CALCIUM 9.2 8.6* 8.9  GFRNONAA 56* >60 >60  GFRAA >60 >60 >60  ANIONGAP 14 10 7     Recent Labs  Lab 03/30/20 0510  PROT 5.7*  ALBUMIN 3.6  AST 14*  ALT 16  ALKPHOS 49  BILITOT 0.8   Hematology Recent Labs  Lab 03/29/20 1440  03/30/20 0510 03/31/20 0436  WBC 5.7 8.4 7.6  RBC 4.86  4.32 4.29  HGB 14.5 12.8* 12.5*  HCT 41.1 36.9* 36.4*  MCV 84.6 85.4 84.8  MCH 29.8 29.6 29.1  MCHC 35.3 34.7 34.3  RDW 13.1 13.2 13.2  PLT 232 211 202   BNPNo results for input(s): BNP, PROBNP in the last 168 hours.  DDimer  Recent Labs  Lab 03/29/20 1440  DDIMER 0.75*     Radiology/Studies:  CT Head Wo Contrast Result Date: 03/29/2020 CLINICAL DATA:  79 year old male with syncope. EXAM: CT HEAD WITHOUT CONTRAST TECHNIQUE: Contiguous axial images were obtained from the base of the skull through the vertex without intravenous contrast. COMPARISON:  11/27/2008 CT FINDINGS: Brain: No evidence of acute infarction, hemorrhage, hydrocephalus, extra-axial collection or mass lesion/mass effect. Atrophy and chronic small-vessel white matter ischemic changes are again noted. Vascular: Carotid and vertebral atherosclerotic calcifications are noted. Skull: Normal. Negative for fracture or focal lesion. Sinuses/Orbits: No acute finding. Other: None. IMPRESSION: 1. No evidence of acute intracranial abnormality. 2. Atrophy and chronic small-vessel white matter ischemic changes. Electronically Signed   By: Harmon Pier M.D.   On: 03/29/2020 17:04   CT Angio Chest PE W and/or Wo Contrast Result Date: 03/29/2020 CLINICAL DATA:  Diaphoresis, dizziness, loss of consciousness EXAM: CT ANGIOGRAPHY CHEST WITH CONTRAST TECHNIQUE: Multidetector CT imaging of the chest was performed using the standard protocol during bolus administration of intravenous contrast. Multiplanar CT image reconstructions and MIPs were obtained to evaluate the vascular anatomy. CONTRAST:  OMNIPAQUE IOHEXOL 350 MG/ML SOLN COMPARISON:  03/29/2020 FINDINGS: Cardiovascular: This is a technically adequate evaluation of the pulmonary vasculature. No filling defects or pulmonary emboli. The heart is enlarged with mild left ventricular dilatation. There is moderate atherosclerosis throughout the coronary vasculature greatest in the LAD  distribution. Mediastinum/Nodes: No enlarged mediastinal, hilar, or axillary lymph nodes. Thyroid gland, trachea, and esophagus demonstrate no significant findings. Lungs/Pleura: No acute airspace disease, effusion, or pneumothorax. Central airways are patent. Upper Abdomen: Calcified gallstones are identified without cholecystitis. Right lobe liver cyst is noted. No acute upper abdominal findings otherwise. Musculoskeletal: No acute or destructive bony lesions. Reconstructed images demonstrate no additional findings. Review of the MIP images confirms the above findings. IMPRESSION: 1. No evidence of pulmonary embolus. 2. Cardiomegaly with left ventricular dilatation. 3. Moderate coronary artery atherosclerosis. 4. Cholelithiasis without cholecystitis. Electronically Signed   By: Sharlet Salina M.D.   On: 03/29/2020 17:01    DG Chest Portable 1 View Result Date: 03/29/2020 CLINICAL DATA:  Syncopal episode.  Chest pain.  Diabetes. EXAM: PORTABLE CHEST 1 VIEW COMPARISON:  11/27/2008 FINDINGS: The heart size and mediastinal contours are within normal limits. Both lungs are clear. The visualized skeletal structures are unremarkable. IMPRESSION: No active disease. Electronically Signed   By: Danae Orleans M.D.   On: 03/29/2020 15:03      VAS US CAROTID Result Date: 03/31/2020 Carotid Arterial Duplex Study Indications:       Syncope. Risk Factors:      Hypertension, hyperlipidemia, Diabetes. Comparison Study:  No prior studies. Performing Technologist: Jean Rosenthal  Examination Guidelines: A complete evaluation includes B-mode imaging, spectral Doppler, color Doppler, and power Doppler as needed of all accessible portions of each vessel. Bilateral testing is considered an integral part of a complete examination. Limited examinations for reoccurring indications may be performed as noted.  Right Carotid Findings:  Summary: Right Carotid: Velocities in the right ICA are consistent with a 1-39% stenosis. Left  Carotid: Velocities  in the left ICA are consistent with a 1-39% stenosis. Vertebrals:  Bilateral vertebral arteries demonstrate antegrade flow. Subclavians: Normal flow hemodynamics were seen in bilateral subclavian              arteries. *See table(s) above for measurements and observations.  Electronically signed by Delia Heady MD on 03/31/2020 at 8:48:53 AM.    Final    {  Assessment and Plan:   1. Syncope     Story sounds more of vagal, hypotension related     Was not abrupt  He has a cardiomyopathy, not previously known for him by his account No historical data to know if EKG/consuction system disease is old or new, though suspect old.  He is pending [facility], if he has CAD requiring intervention, would not place loop If not recommend loop implant I have discussed the rational with the patient, discussed the implant procedure potential risks and benefits and he is agreeable to proceed.  Dr. Ladona Ridgel will see the patient later/post cath   For questions or updates, please contact [facility] Please consult [email or web address] for contact info under    Signed, Sheilah Pigeon, PA-C  03/31/2020 10:34 AM   EP attending  Patient seen and examined.  Agree with the findings as noted and documented above.  The patient is a 79 year old man with an ischemic cardiomyopathy, left bundle branch block/IVCD, who was admitted for evaluation of syncope.  He has severe left ventricular dysfunction with an ejection fraction of 25%.  He has severe native three-vessel disease though his lesions are quite distal in nature and multiple.  He does not have angina.  He has class II heart failure symptoms.  He has a remote history of syncope which do sound like vagal episodes.  The current episode however does not.  I have discussed the treatment options with the patient in detail.  I think initiation of maximal medical therapy along with the placement of a LifeVest is most reasonable.  If his ejection fraction were to  improve on medications, then ICD insertion would likely not be required.  Unfortunately he has no good revascularization options which also make placement of an ICD less than optimal.  I would like to see the patient back 3 months after discharge from the hospital with plans to repeat his 2D echo.  If his functional status remains good and his EF remains poor then a long discussion about the pros and cons of ICD insertion would be reasonable.  Lewayne Bunting, MD

## 2020-03-31 NOTE — Progress Notes (Signed)
Received patient alert and oriented X4, skin warm and dry, resp even and unlabored.  IVF infusing, pt on monitor, consent signed.  Pt waiting for cath procedure.

## 2020-03-31 NOTE — Progress Notes (Signed)
Progress Note  Patient Name: Ryan [name] Date of Encounter: 03/31/2020  [facility] Cardiologist: Armanda Magic, MD   Subjective   Pt feels well this morning, was able to sleep last night. Resting in recumbent position.   Inpatient Medications    Scheduled Meds: . atorvastatin  40 mg Oral QHS  . docusate sodium  100 mg Oral BID  . enoxaparin (LOVENOX) injection  40 mg Subcutaneous Q24H  . gabapentin  300 mg Oral TID  . insulin aspart  0-9 Units Subcutaneous Q4H  . PARoxetine  40 mg Oral Daily  . sodium chloride flush  3 mL Intravenous Q12H   Continuous Infusions:  PRN Meds: acetaminophen **OR** acetaminophen, HYDROcodone-acetaminophen, ondansetron **OR** ondansetron (ZOFRAN) IV   Vital Signs    Vitals:   03/30/20 1020 03/30/20 1820 03/30/20 2032 03/31/20 0459  BP: (!) 144/90 138/79 (!) 141/81 130/79  Pulse: 62 63 62 (!) 57  Resp: Temp: 98.1 F (36.7 C) 98 F (36.7 C) 97.7 F (36.5 C) 97.9 F (36.6 C)  TempSrc: Oral Oral Oral Oral  SpO2: 100% 97% 96% 97%  Weight:      Height:        Intake/Output Summary (Last 24 hours) at 03/31/2020 0849 Last data filed at 03/31/2020 0459 Gross per 24 hour  Intake 240 ml  Output 1500 ml  Net -1260 ml   Last 3 Weights 03/29/2020 01/09/2020 01/07/2020  Weight (lbs) 184 lb 193 lb 198 lb 14.4 oz  Weight (kg) 83.462 kg 87.544 kg 90.22 kg      Telemetry    Sinus-sinus bradycardia in the 50-60s with PVCs  - Personally Reviewed  ECG    No new tracings - Personally Reviewed  Physical Exam   GEN: No acute distress.   Neck: No JVD Cardiac: RRR, no murmurs, rubs, or gallops.  Respiratory: Clear to auscultation bilaterally. GI: Soft, nontender, non-distended  MS: No edema; No deformity. Neuro:  Nonfocal  Psych: Normal affect   Labs    High Sensitivity Troponin:   Recent Labs  Lab 03/29/20 1440 03/29/20 1639 03/30/20 0510  TROPONINIHS 22* 21* 34*      Chemistry Recent Labs  Lab  03/29/20 1425 03/30/20 0510 03/31/20 0436  NA 137 138 139  K 3.8 3.4* 3.6  CL 103 104 104  CO2 20* 24 28  GLUCOSE 340* 154* 175*  BUN CREATININE 1.25* 1.00 1.10  CALCIUM 9.2 8.6* 8.9  PROT  --  5.7*  --   ALBUMIN  --  3.6  --   AST  --  14*  --   ALT  --  16  --   ALKPHOS  --  49  --   BILITOT  --  0.8  --   GFRNONAA 56* >60 >60  GFRAA >60 >60 >60  ANIONGAP Hematology Recent Labs  Lab 03/29/20 1440 03/30/20 0510 03/31/20 0436  WBC 5.7 8.4 7.6  RBC 4.86 4.32 4.29  HGB 14.5 12.8* 12.5*  HCT 41.1 36.9* 36.4*  MCV 84.6 85.4 84.8  MCH 29.8 29.6 29.1  MCHC 35.3 34.7 34.3  RDW 13.1 13.2 13.2  PLT 232 211 202    BNPNo results for input(s): BNP, PROBNP in the last 168 hours.   DDimer  Recent Labs  Lab 03/29/20 1440  DDIMER 0.75*     Radiology    CT Head Wo Contrast  Result Date: 03/29/2020 CLINICAL DATA:  79 year old male with syncope. EXAM: CT HEAD WITHOUT CONTRAST TECHNIQUE: Contiguous axial images were obtained from the base of the skull through the vertex without intravenous contrast. COMPARISON:  11/27/2008 CT FINDINGS: Brain: No evidence of acute infarction, hemorrhage, hydrocephalus, extra-axial collection or mass lesion/mass effect. Atrophy and chronic small-vessel white matter ischemic changes are again noted. Vascular: Carotid and vertebral atherosclerotic calcifications are noted. Skull: Normal. Negative for fracture or focal lesion. Sinuses/Orbits: No acute finding. Other: None. IMPRESSION: 1. No evidence of acute intracranial abnormality. 2. Atrophy and chronic small-vessel white matter ischemic changes. Electronically Signed   By: Harmon Pier M.D.   On: 03/29/2020 17:04   CT Angio Chest PE W and/or Wo Contrast  Result Date: 03/29/2020 CLINICAL DATA:  Diaphoresis, dizziness, loss of consciousness EXAM: CT ANGIOGRAPHY CHEST WITH CONTRAST TECHNIQUE: Multidetector CT imaging of the chest was performed using the standard protocol during  bolus administration of intravenous contrast. Multiplanar CT image reconstructions and MIPs were obtained to evaluate the vascular anatomy. CONTRAST:  OMNIPAQUE IOHEXOL 350 MG/ML SOLN COMPARISON:  03/29/2020 FINDINGS: Cardiovascular: This is a technically adequate evaluation of the pulmonary vasculature. No filling defects or pulmonary emboli. The heart is enlarged with mild left ventricular dilatation. There is moderate atherosclerosis throughout the coronary vasculature greatest in the LAD distribution. Mediastinum/Nodes: No enlarged mediastinal, hilar, or axillary lymph nodes. Thyroid gland, trachea, and esophagus demonstrate no significant findings. Lungs/Pleura: No acute airspace disease, effusion, or pneumothorax. Central airways are patent. Upper Abdomen: Calcified gallstones are identified without cholecystitis. Right lobe liver cyst is noted. No acute upper abdominal findings otherwise. Musculoskeletal: No acute or destructive bony lesions. Reconstructed images demonstrate no additional findings. Review of the MIP images confirms the above findings. IMPRESSION: 1. No evidence of pulmonary embolus. 2. Cardiomegaly with left ventricular dilatation. 3. Moderate coronary artery atherosclerosis. 4. Cholelithiasis without cholecystitis. Electronically Signed   By: Sharlet Salina M.D.   On: 03/29/2020 17:01   DG Chest Portable 1 View  Result Date: 03/29/2020 CLINICAL DATA:  Syncopal episode.  Chest pain.  Diabetes. EXAM: PORTABLE CHEST 1 VIEW COMPARISON:  11/27/2008 FINDINGS: The heart size and mediastinal contours are within normal limits. Both lungs are clear. The visualized skeletal structures are unremarkable. IMPRESSION: No active disease. Electronically Signed   By: Danae Orleans M.D.   On: 03/29/2020 15:03   ECHOCARDIOGRAM COMPLETE  Result Date: 03/30/2020    ECHOCARDIOGRAM REPORT   Patient Name:   Ryan [name] Date of Exam: 03/30/2020 Medical Rec #:  [id number]           Height:        74.0 in Accession #:    [id number]          Weight:       184.0 lb Date of Birth:  09-Feb-1941           BSA:          2.098 m Patient Age:    78 years            BP:           144/90 mmHg Patient Gender: M                   HR:           62 bpm. Exam Location:  Inpatient Procedure: 2D Echo, Cardiac Doppler and Color Doppler Indications:    Syncope  History:        Patient has no prior history of Echocardiogram examinations.  Arrythmias:LBBB, Signs/Symptoms:Syncope; Risk                 Factors:Hypertension, Diabetes and Dyslipidemia.  Sonographer:    Lavenia Atlas Referring Phys: [phone] ANASTASSIA DOUTOVA IMPRESSIONS  1. Left ventricular ejection fraction, by estimation, is 20 to 25%. The left ventricle has severely decreased function. The left ventricle demonstrates global hypokinesis. The left ventricular internal cavity size was moderately dilated. There is moderate left ventricular hypertrophy. Left ventricular diastolic parameters are consistent with Grade II diastolic dysfunction (pseudonormalization). Elevated left atrial pressure.  2. Right ventricular systolic function is normal. The right ventricular size is normal.  3. The mitral valve is normal in structure. Mild mitral valve regurgitation. No evidence of mitral stenosis.  4. The aortic valve is tricuspid. Aortic valve regurgitation is not visualized. Mild aortic valve sclerosis is present, with no evidence of aortic valve stenosis.  5. Aortic dilatation noted. 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 FINDINGS  Left Ventricle: Left ventricular ejection fraction, by estimation, is 20 to 25%. The left ventricle has severely decreased function. The left ventricle demonstrates global hypokinesis. The left ventricular internal cavity size was moderately dilated. There is moderate left ventricular hypertrophy. Left ventricular diastolic parameters are consistent with Grade II diastolic dysfunction (pseudonormalization).  Elevated left atrial pressure. Right Ventricle: The right ventricular size is normal. Right ventricular systolic function is normal. Left Atrium: Left atrial size was normal in size. Right Atrium: Right atrial size was normal in size. Pericardium: There is no evidence of pericardial effusion. Mitral Valve: The mitral valve is normal in structure. Normal mobility of the mitral valve leaflets. Mild mitral valve regurgitation. No evidence of mitral valve stenosis. Tricuspid Valve: The tricuspid valve is normal in structure. Tricuspid valve regurgitation is trivial. No evidence of tricuspid stenosis. Aortic Valve: The aortic valve is tricuspid. Aortic valve regurgitation is not visualized. Mild aortic valve sclerosis is present, with no evidence of aortic valve stenosis. Pulmonic Valve: The pulmonic valve was normal in structure. Pulmonic valve regurgitation is mild. No evidence of pulmonic stenosis. Aorta: Aortic dilatation noted. 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 IAS/Shunts: No atrial level shunt detected by color flow Doppler.  LEFT VENTRICLE PLAX 2D LVIDd:         6.10 cm  Diastology LVIDs:         5.00 cm  LV e' lateral:   7.35 cm/s LV PW:         1.50 cm  LV E/e' lateral: 9.4 LV IVS:        1.50 cm  LV e' medial:    3.68 cm/s LVOT diam:     2.80 cm  LV E/e' medial:  18.8 LV SV:         72 LV SV Index:   34 LVOT Area:     6.16 cm  RIGHT VENTRICLE RV Basal diam:  3.50 cm RV S prime:     10.80 cm/s TAPSE (M-mode): 2.7 cm LEFT ATRIUM             Index       RIGHT ATRIUM           Index LA diam:        4.50 cm 2.15 cm/m  RA Area:     23.80 cm LA Vol (A2C):   52.9 ml 25.22 ml/m RA Volume:   73.70 ml  35.13 ml/m LA Vol (A4C):   44.7 ml 21.31 ml/m LA Biplane  Vol: 50.9 ml 24.26 ml/m  AORTIC VALVE LVOT Vmax:   62.00 cm/s LVOT Vmean:  41.200 cm/s LVOT VTI:    0.117 m  AORTA Ao Root diam: 3.80 cm MITRAL VALVE               TRICUSPID VALVE MV Area (PHT): 3.17 cm    TR Peak grad:   9.4 mmHg MV  Decel Time: 239 msec    TR Vmax:        153.00 cm/s MV E velocity: 69.00 cm/s MV A velocity: 39.40 cm/s  SHUNTS MV E/A ratio:  1.75        Systemic VTI:  0.12 m                            Systemic Diam: 2.80 cm Olga Millers MD Electronically signed by Olga Millers MD Signature Date/Time: 03/30/2020/4:01:32 PM    Final    VAS US CAROTID  Result Date: 03/31/2020 Carotid Arterial Duplex Study Indications:       Syncope. Risk Factors:      Hypertension, hyperlipidemia, Diabetes. Comparison Study:  No prior studies. Performing Technologist: Jean Rosenthal  Examination Guidelines: A complete evaluation includes B-mode imaging, spectral Doppler, color Doppler, and power Doppler as needed of all accessible portions of each vessel. Bilateral testing is considered an integral part of a complete examination. Limited examinations for reoccurring indications may be performed as noted.  Right Carotid Findings: +----------+--------+--------+--------+------------------+------------------+           PSV cm/sEDV cm/sStenosisPlaque DescriptionComments           +----------+--------+--------+--------+------------------+------------------+ CCA Prox  62      9                                 intimal thickening +----------+--------+--------+--------+------------------+------------------+ CCA Distal58      9                                 intimal thickening +----------+--------+--------+--------+------------------+------------------+ ICA Prox  40      10      1-39%                                        +----------+--------+--------+--------+------------------+------------------+ ICA Distal26      9                                                    +----------+--------+--------+--------+------------------+------------------+ ECA       71      9                                                    +----------+--------+--------+--------+------------------+------------------+  +----------+--------+-------+----------------+-------------------+           PSV cm/sEDV cmsDescribe        Arm Pressure (mmHG) +----------+--------+-------+----------------+-------------------+ [id number]             Multiphasic, WNL                    +----------+--------+-------+----------------+-------------------+ +---------+--------+--+--------+-+---------+  VertebralPSV cm/s25EDV cm/s7Antegrade +---------+--------+--+--------+-+---------+  Left Carotid Findings: +----------+--------+--------+--------+------------------+------------------+           PSV cm/sEDV cm/sStenosisPlaque DescriptionComments           +----------+--------+--------+--------+------------------+------------------+ CCA Prox  59      11                                intimal thickening +----------+--------+--------+--------+------------------+------------------+ CCA Distal63      17                                intimal thickening +----------+--------+--------+--------+------------------+------------------+ ICA Prox  42      11      1-39%                                        +----------+--------+--------+--------+------------------+------------------+ ICA Distal61      17                                                   +----------+--------+--------+--------+------------------+------------------+ ECA       77      9                                                    +----------+--------+--------+--------+------------------+------------------+ +----------+--------+--------+----------------+-------------------+           PSV cm/sEDV cm/sDescribe        Arm Pressure (mmHG) +----------+--------+--------+----------------+-------------------+ [id number]              Multiphasic, WNL                    +----------+--------+--------+----------------+-------------------+ +---------+--------+--+--------+--+---------+ VertebralPSV cm/s44EDV cm/s11Antegrade  +---------+--------+--+--------+--+---------+   Summary: Right Carotid: Velocities in the right ICA are consistent with a 1-39% stenosis. Left Carotid: Velocities in the left ICA are consistent with a 1-39% stenosis. Vertebrals:  Bilateral vertebral arteries demonstrate antegrade flow. Subclavians: Normal flow hemodynamics were seen in bilateral subclavian              arteries. *See table(s) above for measurements and observations.  Electronically signed by Delia Heady MD on 03/31/2020 at 8:48:53 AM.    Final     Cardiac Studies   Echo 03/30/20: 1. Left ventricular ejection fraction, by estimation, is 20 to 25%. The  left ventricle has severely decreased function. The left ventricle  demonstrates global hypokinesis. The left ventricular internal cavity size  was moderately dilated. There is  moderate left ventricular hypertrophy. Left ventricular diastolic  parameters are consistent with Grade II diastolic dysfunction  (pseudonormalization). Elevated left atrial pressure.  2. Right ventricular systolic function is normal. The right ventricular  size is normal.  3. The mitral valve is normal in structure. Mild mitral valve  regurgitation. No evidence of mitral stenosis.  4. The aortic valve is tricuspid. Aortic valve regurgitation is not  visualized. Mild aortic valve sclerosis is present, with no evidence of  aortic valve stenosis.  5. Aortic dilatation noted. There is mild dilatation of the aortic root  measuring 38  mm.   Patient Profile     79 y.o. 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 - pt denies anginal symptoms, but has had recurrent syncope related to hot weather - given his echo with EF of  20-25%, will proceed with right and left heart cath today - if no CAD, will still consider ILR   Hyperlipidemia 03/31/2020: Cholesterol 131; HDL 27; LDL Cholesterol 60; Triglycerides 220; VLDL 44 - LDL is at goal - continue 40 mg lipitor   DM - uncontrolled - A1c 10.4% - diabetes coordinator consulted   The patient understands that risks included but are not limited to stroke (1 in 1000), death (1 in 1000), kidney failure [usually temporary] (1 in 500), bleeding (1 in 200), allergic reaction [possibly serious] (1 in 200).    Cath orders to follow. Keep NPO.    For questions or updates, please contact [facility] Please consult [email or web address] for contact info under        Signed, Marcelino Dusterngela Nicole Oddie Bottger, PA  03/31/2020, 8:49 AM

## 2020-03-31 NOTE — Progress Notes (Signed)
PROGRESS NOTE  Slate Debroux  QIH:[id number] DOB: 1941-05-15 DOA: 03/29/2020 PCP: Philip Aspen, Limmie Patricia, MD   Brief Narrative: Ryan [name] is a 79 y.o. male with a history of poorly-controlled T2DM, HTN, HLD, peripheral neuropathy, BPH and syncopal episodes who presented after syncopal episode with prodromal lightheadedness at the grocery store on 7/11. EMS was called, found CBG to be 300, gave 500cc IVF. He has not been taking several diabetes medications and eats at irregular intervals per his son. In the ED troponin was mildly elevated at 22 >> 34 with no reports of chest pain, ECG demonstrating 1st degree AVB and LBBB with unclear chronicity. CTA chest showed no PE, but there were coronary calcifications. Labs showed lactic acid elevation which resolved with fluids, AKI with hyperglycemia which are improved. 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   Assessment & Plan: Active Problems:   HTN (hypertension)   DM (diabetes mellitus), type 2, uncontrolled w/neurologic complication (HCC)   Neuropathy, peripheral   BPH (benign prostatic hyperplasia)   GAD (generalized anxiety disorder)   Hyperlipidemia   Syncope   Cardiomegaly   Elevated troponin   LBBB (left bundle branch block)   AKI (acute kidney injury) (HCC)   Syncope and collapse   Acute HFrEF (heart failure with reduced ejection fraction) (HCC)  Syncope, likely orthostatic with prodrome: Suspect dehydration due to heat, irregular hydration/eating habits, and severe hyperglycemia acutely and chronically. After IVF resuscitation, orthostatic vital signs are normal. Carotid U/S without significant stenosis.  - Echocardiogram revealing severe LV dysfunction, left  and right heart catheterization planned this morning at [facility]. Unclear whether he will remain at [facility].   - With LBBB, significant ischemic risk factors and evident coronary calcifications, cardiology consulted, LHC as above.  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 Does not appear overloaded at this time, was dehydrated on admission. IV fluids stopped this morning.  - Will need initiation of guideline-based medications. With possibility of contrast if coronary angiography is recommended, will hold on ACE/ARB/ARNI at this time. Has bradycardic rate w/1st deg AVB, will await cardiology recommendations for BB. Appears euvolemic currently.  Poorly-controlled T2DM: HbA1c 10.4%. Admittedly very poor diet with irregular eating habits including nearly no limitations on carbohydrate and sugar-rich foods and drinks. States he hasn't been taking diabetes medications recommended by PCP with exception of metformin.  - SSI while admitted. May discharge on metformin and restart [facility]. - Extensive education by RD/diabetes coordinator and medical team while admitted.  - Urged need for follow up, adherence to medications, and communication of intolerances/preferences in constructive relationship with PCP.   AKI:  - Improved with fluids, CrCl >41ml/min, will need monitoring for CIN  HTN:  - Holding home medication at this time  GAD:  - Continue SSRI  BPH:  - Restart flomax  Hyperlipidemia: LDL at goal at 60. - Continue statin.   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  - Monitor  Hypokalemia, hypomagnesemia:  - Supplement  History of vitamin B12 deficiency with peripheral neuropathy: Level >1500 on last check.  - Continue gabapentin  DVT prophylaxis: Lovenox Code Status: Full Family Communication: None at bedside Disposition Plan:  Status is: Inpatient due to new LV systolic dysfunction and L and R heart  catheterization planned.  Dispo: The patient is from: Home              Anticipated d/c is to: Home              Anticipated d/c date is: 1-2 days              Patient currently is not medically stable to d/c.  Consultants:   Cardiology, EP  Procedures:   Echocardiogram:  1. Left ventricular ejection fraction, by estimation, is 20 to 25%. The  left ventricle has severely decreased function. The left ventricle  demonstrates global hypokinesis. The left ventricular internal cavity size  was moderately dilated. There is  moderate left ventricular hypertrophy. Left ventricular diastolic  parameters are consistent with Grade II diastolic dysfunction  (pseudonormalization). Elevated left atrial pressure.  2. Right ventricular systolic function is normal. The right ventricular  size is normal.  3. The mitral valve is normal in structure. Mild mitral valve  regurgitation. No evidence of mitral stenosis.  4. The aortic valve is tricuspid. Aortic valve regurgitation is not  visualized. Mild aortic valve sclerosis is present, with no evidence of  aortic valve stenosis.  5. Aortic dilatation noted. There is mild dilatation of the aortic root  measuring 38 mm.   Antimicrobials:  None   Subjective: No chest pain, dyspnea, abd pain, N/V/D, leg swelling, orthopnea. Slept very well last night, he's hungry this AM prior to going to [facility] for Cath. Says cardiology is calling his Son, does not need me to.  Objective: Vitals:   03/30/20 1020 03/30/20 1820 03/30/20 2032 03/31/20 0459  BP: (!) 144/90 138/79 (!) 141/81 130/79  Pulse: 62 63 62 (!) 57  Resp: Temp: 98.1 F (36.7 C) 98 F (36.7 C) 97.7 F (36.5 C) 97.9 F (36.6 C)  TempSrc: Oral Oral Oral Oral  SpO2: 100% 97% 96% 97%  Weight:      Height:        Intake/Output Summary (Last 24 hours) at 03/31/2020 0957 Last data filed at 03/31/2020 0459 Gross per 24 hour  Intake 240 ml  Output 1500 ml  Net -1260 ml    Filed Weights   03/29/20 1501  Weight: 83.5 kg   Gen: 79 y.o. male in no distress Pulm: Nonlabored breathing room air. Clear. CV: Regular bradycardia. No murmur, rub, or gallop. No JVD, no dependent edema. GI: Abdomen soft, non-tender, non-distended, with normoactive bowel sounds.  Ext: Warm, no deformities Skin: No rashes, lesions or ulcers on visualized skin. Neuro: Alert and oriented. No focal neurological deficits. Psych: Judgement and insight appear fair. Mood euthymic & affect congruent. Behavior is appropriate.    Data Reviewed: I have personally reviewed following labs and imaging studies  CBC: Recent Labs  Lab 03/29/20 1440 03/30/20 0510 03/31/20 0436  WBC 5.7 8.4 7.6  NEUTROABS 4.0 5.1  --   HGB 14.5 12.8* 12.5*  HCT 41.1 36.9* 36.4*  MCV 84.6 85.4 84.8  PLT 232 211 202   Basic Metabolic Panel: Recent Labs  Lab 03/29/20 1425 03/29/20 1639 03/30/20 0510 03/31/20 0436  NA 137  --  138 139  K 3.8  --  3.4* 3.6  CL 103  --  104 104  CO2 20*  --  24 28  GLUCOSE 340*  --  154* 175*  BUN 20  --  17 16  CREATININE 1.25*  --  1.00 1.10  CALCIUM 9.2  --  8.6* 8.9  MG  --  1.7 1.6* 2.0  PHOS  --  2.2* 2.8  --    GFR: Estimated Creatinine Clearance: 66.4 mL/min (by C-G formula based on SCr of 1.1 mg/dL). Liver Function Tests: Recent Labs  Lab 03/30/20 0510  AST 14*  ALT 16  ALKPHOS 49  BILITOT 0.8  PROT 5.7*  ALBUMIN 3.6   No results for input(s): LIPASE, AMYLASE in the last 168 hours. No results for input(s): AMMONIA in the last 168 hours. Coagulation Profile: No results for input(s): INR, PROTIME in the last 168 hours. Cardiac Enzymes: Recent Labs  Lab 03/29/20 1440  CKTOTAL 66   BNP (last 3 results) No results for input(s): PROBNP in the last 8760 hours. HbA1C: Recent Labs    03/29/20 1948  HGBA1C 10.4*   CBG: Recent Labs  Lab 03/30/20 1637 03/30/20 2028 03/30/20 2349 03/31/20 0453 03/31/20 0745  GLUCAP 187* 232* 295* 192* 171*    Lipid Profile: Recent Labs    03/31/20 0436  CHOL 131  HDL 27*  LDLCALC 60  TRIG 242*  CHOLHDL 4.9   Thyroid Function Tests: Recent Labs    03/30/20 0510  TSH 0.713   Anemia Panel: No results for input(s): VITAMINB12, FOLATE, FERRITIN, TIBC, IRON, RETICCTPCT in the last 72 hours. Urine analysis:    Component Value Date/Time   COLORURINE YELLOW 03/29/2020 1425   APPEARANCEUR CLEAR 03/29/2020 1425   LABSPEC 1.036 (H) 03/29/2020 1425   PHURINE 5.0 03/29/2020 1425   GLUCOSEU >=500 (A) 03/29/2020 1425   HGBUR NEGATIVE 03/29/2020 1425   BILIRUBINUR NEGATIVE 03/29/2020 1425   KETONESUR 5 (A) 03/29/2020 1425   PROTEINUR NEGATIVE 03/29/2020 1425   UROBILINOGEN 0.2 11/27/2008 1045   NITRITE NEGATIVE 03/29/2020 1425   LEUKOCYTESUR NEGATIVE 03/29/2020 1425   Recent Results (from the past 240 hour(s))  SARS Coronavirus 2 by RT PCR (hospital order, performed in [facility] hospital lab) Nasopharyngeal Nasopharyngeal Swab     Status: None   Collection Time: 03/29/20  6:34 PM   Specimen: Nasopharyngeal Swab  Result Value Ref Range Status   SARS Coronavirus 2 NEGATIVE NEGATIVE Final    Comment: (NOTE) SARS-CoV-2 target nucleic acids are NOT DETECTED.  The SARS-CoV-2 RNA is generally detectable in upper and lower respiratory specimens during the acute phase of infection. The lowest concentration of SARS-CoV-2 viral copies this assay can detect is 250 copies / mL. A negative result does not preclude SARS-CoV-2 infection and should not be used as the sole basis for treatment or other patient management decisions.  A negative result may occur with improper specimen collection / handling, submission of specimen other than nasopharyngeal swab, presence of viral mutation(s) within the areas targeted by this assay, and inadequate number of viral copies (<250 copies / mL). A negative result must be combined with clinical observations, patient history, and epidemiological  information.  Fact Sheet for Patients:   BoilerBrush.com.cy  Fact Sheet for Healthcare Providers: https://pope.com/  This test is not yet approved or  cleared by the [place] FDA and has been authorized for detection and/or diagnosis of SARS-CoV-2 by FDA under an Emergency Use Authorization (EUA).  This EUA will remain in effect (meaning this test can be used) for the duration of the COVID-19 declaration under Section 564(b)(1) of the Act, 21 U.S.C. section 360bbb-3(b)(1), unless the authorization is terminated or revoked sooner.  Performed at [facility] [address], [place], [place] [address]   MRSA PCR Screening     Status: None   Collection Time:  03/31/20  5:40 AM   Specimen: Nasal Mucosa; Nasopharyngeal  Result Value Ref Range Status   MRSA by PCR NEGATIVE NEGATIVE Final    Comment:        The GeneXpert MRSA Assay (FDA approved for NASAL specimens only), is one component of a comprehensive MRSA colonization surveillance program. It is not intended to diagnose MRSA infection nor to guide or monitor treatment for MRSA infections. Performed at [facility], [address] [place], [place] [address]       Radiology Studies: CT Head Wo Contrast  Result Date: 03/29/2020 CLINICAL DATA:  79 year old male with syncope. EXAM: CT HEAD WITHOUT CONTRAST TECHNIQUE: Contiguous axial images were obtained from the base of the skull through the vertex without intravenous contrast. COMPARISON:  11/27/2008 CT FINDINGS: Brain: No evidence of acute infarction, hemorrhage, hydrocephalus, extra-axial collection or mass lesion/mass effect. Atrophy and chronic small-vessel white matter ischemic changes are again noted. Vascular: Carotid and vertebral atherosclerotic calcifications are noted. Skull: Normal. Negative for fracture or focal lesion. Sinuses/Orbits: No acute finding. Other: None. IMPRESSION:  1. No evidence of acute intracranial abnormality. 2. Atrophy and chronic small-vessel white matter ischemic changes. Electronically Signed   By: Harmon Pier M.D.   On: 03/29/2020 17:04   CT Angio Chest PE W and/or Wo Contrast  Result Date: 03/29/2020 CLINICAL DATA:  Diaphoresis, dizziness, loss of consciousness EXAM: CT ANGIOGRAPHY CHEST WITH CONTRAST TECHNIQUE: Multidetector CT imaging of the chest was performed using the standard protocol during bolus administration of intravenous contrast. Multiplanar CT image reconstructions and MIPs were obtained to evaluate the vascular anatomy. CONTRAST:  OMNIPAQUE IOHEXOL 350 MG/ML SOLN COMPARISON:  03/29/2020 FINDINGS: Cardiovascular: This is a technically adequate evaluation of the pulmonary vasculature. No filling defects or pulmonary emboli. The heart is enlarged with mild left ventricular dilatation. There is moderate atherosclerosis throughout the coronary vasculature greatest in the LAD distribution. Mediastinum/Nodes: No enlarged mediastinal, hilar, or axillary lymph nodes. Thyroid gland, trachea, and esophagus demonstrate no significant findings. Lungs/Pleura: No acute airspace disease, effusion, or pneumothorax. Central airways are patent. Upper Abdomen: Calcified gallstones are identified without cholecystitis. Right lobe liver cyst is noted. No acute upper abdominal findings otherwise. Musculoskeletal: No acute or destructive bony lesions. Reconstructed images demonstrate no additional findings. Review of the MIP images confirms the above findings. IMPRESSION: 1. No evidence of pulmonary embolus. 2. Cardiomegaly with left ventricular dilatation. 3. Moderate coronary artery atherosclerosis. 4. Cholelithiasis without cholecystitis. Electronically Signed   By: Sharlet Salina M.D.   On: 03/29/2020 17:01   DG Chest Portable 1 View  Result Date: 03/29/2020 CLINICAL DATA:  Syncopal episode.  Chest pain.  Diabetes. EXAM: PORTABLE CHEST 1 VIEW COMPARISON:   11/27/2008 FINDINGS: The heart size and mediastinal contours are within normal limits. Both lungs are clear. The visualized skeletal structures are unremarkable. IMPRESSION: No active disease. Electronically Signed   By: Danae Orleans M.D.   On: 03/29/2020 15:03   ECHOCARDIOGRAM COMPLETE  Result Date: 03/30/2020    ECHOCARDIOGRAM REPORT   Patient Name:   ROCKFORD LEINEN Date of Exam: 03/30/2020 Medical Rec #:  [id number]           Height:       74.0 in Accession #:    [id number]          Weight:       184.0 lb Date of Birth:  [birth year]           BSA:  2.098 m Patient Age:    78 years            BP:           144/90 mmHg Patient Gender: M                   HR:           62 bpm. Exam Location:  Inpatient Procedure: 2D Echo, Cardiac Doppler and Color Doppler Indications:    Syncope  History:        Patient has no prior history of Echocardiogram examinations.                 Arrythmias:LBBB, Signs/Symptoms:Syncope; Risk                 Factors:Hypertension, Diabetes and Dyslipidemia.  Sonographer:    Lavenia AtlasBrooke Strickland Referring Phys: [phone] [id number] ANASTASSIA DOUTOVA IMPRESSIONS  1. Left ventricular ejection fraction, by estimation, is 20 to 25%. The left ventricle has severely decreased function. The left ventricle demonstrates global hypokinesis. The left ventricular internal cavity size was moderately dilated. There is moderate left ventricular hypertrophy. Left ventricular diastolic parameters are consistent with Grade II diastolic dysfunction (pseudonormalization). Elevated left atrial pressure.  2. Right ventricular systolic function is normal. The right ventricular size is normal.  3. The mitral valve is normal in structure. Mild mitral valve regurgitation. No evidence of mitral stenosis.  4. The aortic valve is tricuspid. Aortic valve regurgitation is not visualized. Mild aortic valve sclerosis is present, with no evidence of aortic valve stenosis.  5. Aortic dilatation noted. There is mild dilatation of the  aortic root measuring 38 mm. FINDINGS  Left Ventricle: Left ventricular ejection fraction, by estimation, is 20 to 25%. The left ventricle has severely decreased function. The left ventricle demonstrates global hypokinesis. The left ventricular internal cavity size was moderately dilated. There is moderate left ventricular hypertrophy. Left ventricular diastolic parameters are consistent with Grade II diastolic dysfunction (pseudonormalization). Elevated left atrial pressure. Right Ventricle: The right ventricular size is normal. Right ventricular systolic function is normal. Left Atrium: Left atrial size was normal in size. Right Atrium: Right atrial size was normal in size. Pericardium: There is no evidence of pericardial effusion. Mitral Valve: The mitral valve is normal in structure. Normal mobility of the mitral valve leaflets. Mild mitral valve regurgitation. No evidence of mitral valve stenosis. Tricuspid Valve: The tricuspid valve is normal in structure. Tricuspid valve regurgitation is trivial. No evidence of tricuspid stenosis. Aortic Valve: The aortic valve is tricuspid. Aortic valve regurgitation is not visualized. Mild aortic valve sclerosis is present, with no evidence of aortic valve stenosis. Pulmonic Valve: The pulmonic valve was normal in structure. Pulmonic valve regurgitation is mild. No evidence of pulmonic stenosis. Aorta: Aortic dilatation noted. 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 IAS/Shunts: No atrial level shunt detected by color flow Doppler.  LEFT VENTRICLE PLAX 2D LVIDd:         6.10 cm  Diastology LVIDs:         5.00 cm  LV e' lateral:   7.35 cm/s LV PW:         1.50 cm  LV E/e' lateral: 9.4 LV IVS:        1.50 cm  LV e' medial:    3.68 cm/s LVOT diam:     2.80 cm  LV E/e' medial:  18.8 LV SV:         72 LV SV Index:  34 LVOT Area:     6.16 cm  RIGHT VENTRICLE RV Basal diam:  3.50 cm RV S prime:     10.80 cm/s TAPSE (M-mode): 2.7 cm LEFT ATRIUM              Index       RIGHT ATRIUM           Index LA diam:        4.50 cm 2.15 cm/m  RA Area:     23.80 cm LA Vol (A2C):   52.9 ml 25.22 ml/m RA Volume:   73.70 ml  35.13 ml/m LA Vol (A4C):   44.7 ml 21.31 ml/m LA Biplane Vol: 50.9 ml 24.26 ml/m  AORTIC VALVE LVOT Vmax:   62.00 cm/s LVOT Vmean:  41.200 cm/s LVOT VTI:    0.117 m  AORTA Ao Root diam: 3.80 cm MITRAL VALVE               TRICUSPID VALVE MV Area (PHT): 3.17 cm    TR Peak grad:   9.4 mmHg MV Decel Time: 239 msec    TR Vmax:        153.00 cm/s MV E velocity: 69.00 cm/s MV A velocity: 39.40 cm/s  SHUNTS MV E/A ratio:  1.75        Systemic VTI:  0.12 m                            Systemic Diam: 2.80 cm Olga Millers MD Electronically signed by Olga Millers MD Signature Date/Time: 03/30/2020/4:01:32 PM    Final    VAS US CAROTID  Result Date: 03/31/2020 Carotid Arterial Duplex Study Indications:       Syncope. Risk Factors:      Hypertension, hyperlipidemia, Diabetes. Comparison Study:  No prior studies. Performing Technologist: Jean Rosenthal  Examination Guidelines: A complete evaluation includes B-mode imaging, spectral Doppler, color Doppler, and power Doppler as needed of all accessible portions of each vessel. Bilateral testing is considered an integral part of a complete examination. Limited examinations for reoccurring indications may be performed as noted.  Right Carotid Findings: +----------+--------+--------+--------+------------------+------------------+           PSV cm/sEDV cm/sStenosisPlaque DescriptionComments           +----------+--------+--------+--------+------------------+------------------+ CCA Prox  62      9                                 intimal thickening +----------+--------+--------+--------+------------------+------------------+ CCA Distal58      9                                 intimal thickening +----------+--------+--------+--------+------------------+------------------+ ICA Prox  40      10       1-39%                                        +----------+--------+--------+--------+------------------+------------------+ ICA Distal26      9                                                    +----------+--------+--------+--------+------------------+------------------+  ECA       71      9                                                    +----------+--------+--------+--------+------------------+------------------+ +----------+--------+-------+----------------+-------------------+           PSV cm/sEDV cmsDescribe        Arm Pressure (mmHG) +----------+--------+-------+----------------+-------------------+ [id number]             Multiphasic, WNL                    +----------+--------+-------+----------------+-------------------+ +---------+--------+--+--------+-+---------+ VertebralPSV cm/s25EDV cm/s7Antegrade +---------+--------+--+--------+-+---------+  Left Carotid Findings: +----------+--------+--------+--------+------------------+------------------+           PSV cm/sEDV cm/sStenosisPlaque DescriptionComments           +----------+--------+--------+--------+------------------+------------------+ CCA Prox  59      11                                intimal thickening +----------+--------+--------+--------+------------------+------------------+ CCA Distal63      17                                intimal thickening +----------+--------+--------+--------+------------------+------------------+ ICA Prox  42      11      1-39%                                        +----------+--------+--------+--------+------------------+------------------+ ICA Distal61      17                                                   +----------+--------+--------+--------+------------------+------------------+ ECA       77      9                                                    +----------+--------+--------+--------+------------------+------------------+  +----------+--------+--------+----------------+-------------------+           PSV cm/sEDV cm/sDescribe        Arm Pressure (mmHG) +----------+--------+--------+----------------+-------------------+ [id number]              Multiphasic, WNL                    +----------+--------+--------+----------------+-------------------+ +---------+--------+--+--------+--+---------+ VertebralPSV cm/s44EDV cm/s11Antegrade +---------+--------+--+--------+--+---------+   Summary: Right Carotid: Velocities in the right ICA are consistent with a 1-39% stenosis. Left Carotid: Velocities in the left ICA are consistent with a 1-39% stenosis. Vertebrals:  Bilateral vertebral arteries demonstrate antegrade flow. Subclavians: Normal flow hemodynamics were seen in bilateral subclavian              arteries. *See table(s) above for measurements and observations.  Electronically signed by Delia Heady MD on 03/31/2020 at 8:48:53 AM.    Final     Scheduled Meds: . atorvastatin  40 mg Oral QHS  . docusate sodium  100 mg Oral BID  .  enoxaparin (LOVENOX) injection  40 mg Subcutaneous Q24H  . gabapentin  300 mg Oral TID  . insulin aspart  0-9 Units Subcutaneous Q4H  . PARoxetine  40 mg Oral Daily  . sodium chloride flush  3 mL Intravenous Q12H  . sodium chloride flush  3 mL Intravenous Q12H   Continuous Infusions: . sodium chloride    . sodium chloride       LOS: 0 days   Time spent: 25 minutes.  Tyrone Nine, MD [facility] [email or web address] 03/31/2020, 9:57 AM

## 2020-03-31 NOTE — Progress Notes (Signed)
OT Cancellation Note  Patient Details Name: Ryan [name] MRN: [id number] DOB: 1940/11/02   Cancelled Treatment:    Reason Eval/Treat Not Completed: Other (comment); pt currently transferring to [facility]. Will follow up for OT eval as able.  Marcy Siren, OT Acute Rehabilitation Services Pager [phone] Office [phone]   Orlando Penner 03/31/2020, 10:14 AM

## 2020-03-31 NOTE — Progress Notes (Signed)
NUTRITION NOTE  RD consulted for nutrition education regarding diabetes.   Lab Results  Component Value Date   HGBA1C 10.4 (H) 03/29/2020   Consult received for patient and family desiring DM diet/carb modified diet education.  Patient is currently out of his room and no family/visitors are present in the room. Able to talk with RN who reports patient was taken to [facility] around 0900 for planned cardiac cath and that she has not heard anything further on when he may be back or if he is coming back.  Left handouts from the Academy of Nutrition and Dietetics on bedside table: "Carbohydrate Counting for People with Diabetes" and "Diabetes Label Reading Tips".   Patient has not been seen by a [place] RD at any time in the past. He was assessed by Diabetes Coordinator yesterday morning. Please see her note.   Patient has a hx of diabetes. Current DM-related medication order is sliding scale insulin. CBGs this AM: 192 and 171 mg/dl and [id number] yesterday was 10.4%.    Expected compliance: unable to determine.  Body mass index is 23.62 kg/m. Pt meets criteria for normal weight based on current BMI.  Current diet order is NPO.  RD contact information provided. If additional nutrition issues arise, please re-consult RD.      Trenton Gammon, MS, RD, LDN, CNSC Inpatient Clinical Dietitian RD pager # available in [facility]  After hours/weekend pager # available in [facility]

## 2020-03-31 NOTE — H&P (View-Only) (Signed)
We are asked to consider loop implant for this patient for recurrent syncope.  He is planned for [facility] later today. °Chart has been reviewed °D/w Dr. Taylor, loop implant reasonable unless he has CAD that requires intervention. °Discussed with cards APP this morning, she tells me they have discussed loop with the patient, implant and rational, he is on board. °EP service will see him when he gets to [facility] in consult, I have asked he be consented ahead of time for possible loop. ° °Aimy Sweeting, PA_C °

## 2020-04-01 ENCOUNTER — Encounter (HOSPITAL_COMMUNITY): Payer: Self-pay | Admitting: Interventional Cardiology

## 2020-04-01 ENCOUNTER — Inpatient Hospital Stay (HOSPITAL_COMMUNITY): Payer: Medicare Other

## 2020-04-01 DIAGNOSIS — I5021 Acute systolic (congestive) heart failure: Secondary | ICD-10-CM

## 2020-04-01 LAB — GLUCOSE, CAPILLARY
Glucose-Capillary: 144 mg/dL — ABNORMAL HIGH (ref 70–99)
Glucose-Capillary: 155 mg/dL — ABNORMAL HIGH (ref 70–99)
Glucose-Capillary: 200 mg/dL — ABNORMAL HIGH (ref 70–99)
Glucose-Capillary: 211 mg/dL — ABNORMAL HIGH (ref 70–99)
Glucose-Capillary: 212 mg/dL — ABNORMAL HIGH (ref 70–99)
Glucose-Capillary: 213 mg/dL — ABNORMAL HIGH (ref 70–99)
Glucose-Capillary: 223 mg/dL — ABNORMAL HIGH (ref 70–99)

## 2020-04-01 MED ORDER — ATORVASTATIN CALCIUM 80 MG PO TABS
80.0000 mg | ORAL_TABLET | Freq: Every day | ORAL | Status: DC
  Administered 2020-04-01 – 2020-04-02 (×2): 80 mg via ORAL
  Filled 2020-04-01 (×2): qty 1

## 2020-04-01 MED ORDER — GADOBUTROL 1 MMOL/ML IV SOLN
10.0000 mL | Freq: Once | INTRAVENOUS | Status: AC | PRN
  Administered 2020-04-01: 10 mL via INTRAVENOUS

## 2020-04-01 NOTE — Progress Notes (Signed)
PROGRESS NOTE    Ryan [name]  ZOX:[id number] DOB: 09-15-1941 DOA: 03/29/2020 PCP: Philip Aspen, Limmie Patricia, MD   Brief Narrative: Patient is a 79 year old male with history of poorly controlled diabetes mellitus, hypertension, hyperlipidemia, peripheral neuropathy, BPH, syncope who presented with syncopal episode with prodrome of lightheadedness at a grocery store on 7/11.  In the emergency department, troponin was mildly elevated with no reports of chest pain.  EKG showed first-degree AV block with left bundle branch block with unclear chronicity.  Labs showed elevated lactic acid/AKI with resolved with fluids.  Cardiology was consulted. Echo showed significant cardiomyopathy with ejection fraction of 20 to 25%..  Underwent cardiac cath. He underwent cardiac MRI today.  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  Presented with AKI, lactic acidosis.  Found to be dehydrated on presentation.  Given IV fluids.  Orthostatic vital signs are normal.  Carotid Doppler did not show any significant stenosis.  Echo showed severe left ventricular dysfunction.  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  Cath showed severe diffuse three-vessel coronary artery disease.  Cardiology following.  Cardiology recommending cardiac MRI to determine if viable myocardium is present/TCTS consult.   Cardiology also recommending medical therapy for heart failure, strong anti-ischemic regimen, consideration of resynchronization/ICD. Denies any chest pain.  Continue aspirin, high-dose statin.  Beta-blockers not started due to bradycardia.  Continue Imdur.  Started  on [place].  Poorly controlled diabetes mellitus type 2: Hemoglobin A1c of 10.4.  He has irregular eating habits with no limitations on carbohydrate and sugar days for 7 drinks.  He has not been taking any diabetic medications recommended by PCP with exception of Metformin.  Continue sliding scale insulin here.  Diabetic coordinator was following.  Recommended to restart Amaryl on DC with follow-up with PCP.  AKI: Resolved with IV fluids  Hypertension: Currently blood pressure stable.  Home antihypertensives on hold.  Generalized anxiety disorder: Continue home medications  BPH: Continue Flomax  Hyperlipidemia: Continue statin  Aortic root dilation: Aortic root dilated to 13 mm by echocardiogram.  Continue to monitor  Hypokalemia/hypomagnesemia: Continue monitoring and supplementation  History of vitamin B12 deficiency/peripheral neuropathy: Continue: Continue gabapentin              DVT prophylaxis:Lovenox Code Status: Full Family Communication: Son at bedside Status is: Inpatient  Remains inpatient appropriate because:Unsafe d/c plan   Dispo: The patient is from: Home              Anticipated d/c is to: Home              Anticipated d/c date is: 1 day              Patient currently is not medically stable to d/c.   Consultants: Cardiology  Procedures:Cardiac cath  Antimicrobials:  Anti-infectives (From admission, onward)   None      Subjective: Patient seen and examined at the bedside this afternoon.  Currently hemodynamically  stable.  Chest pain-free.  Very eager to go home.  Objective: Vitals:   03/31/20 2023 04/01/20 0029 04/01/20 0421 04/01/20 0752  BP: 132/86 115/77 126/80 134/82    Pulse:  67 (!) 58 63  Resp: Temp: 98.2 F (36.8 C) 98.2 F (36.8 C) 98.3 F (36.8 C) 98.2 F (36.8 C)  TempSrc: Oral Oral Oral Oral  SpO2: 97% 97% 96% 98%  Weight:  91.5 kg    Height:        Intake/Output Summary (Last 24 hours) at 04/01/2020 0912 Last data filed at 04/01/2020 0200 Gross per 24 hour  Intake 921.5 ml  Output 1000 ml  Net -78.5 ml   Filed Weights   03/29/20 1501 04/01/20 0029  Weight: 83.5 kg 91.5 kg    Examination:  General exam: Appears calm and comfortable ,Not in distress,average built HEENT:PERRL,Oral mucosa moist, Ear/Nose normal on gross exam Respiratory system: Bilateral equal air entry, normal vesicular breath sounds, no wheezes or crackles  Cardiovascular system: S1 & S2 heard, RRR. No JVD, murmurs, rubs, gallops or clicks. No pedal edema. Gastrointestinal system: Abdomen is nondistended, soft and nontender. No organomegaly or masses felt. Normal bowel sounds heard. Central nervous system: Alert and oriented.  Extremities: No edema, no clubbing ,no cyanosis Skin: No rashes, lesions or ulcers,no icterus ,no pallor   Data Reviewed: I have personally reviewed following labs and imaging studies  CBC: Recent Labs  Lab 03/29/20 1440 03/29/20 1440 03/30/20 0510 03/31/20 0436 03/31/20 1426 03/31/20 1428 03/31/20 1808  WBC 5.7  --  8.4 7.6  --   --  7.0  NEUTROABS 4.0  --  5.1  --   --   --   --   HGB 14.5   < > 12.8* 12.5* 12.2* 12.6* 12.7*  HCT 41.1   < > 36.9* 36.4* 36.0* 37.0* 37.1*  MCV 84.6  --  85.4 84.8  --   --  85.9  PLT 232  --  211 202  --   --  196   < > = values in this interval not displayed.   Basic Metabolic Panel: Recent Labs  Lab 03/29/20 1425 03/29/20 1639 03/30/20 0510 03/31/20 0436 03/31/20 1426 03/31/20 1428 03/31/20 1808  NA 137  --  138 139 143 143  --   K 3.8  --  3.4* 3.6 3.5 3.5  --   CL 103  --  104 104  --   --   --   CO2 20*  --  24 28  --   --   --   GLUCOSE 340*  --  154* 175*  --   --    --   BUN 20  --  17 16  --   --   --   CREATININE 1.25*  --  1.00 1.10  --   --  1.13  CALCIUM 9.2  --  8.6* 8.9  --   --   --   MG  --  1.7 1.6* 2.0  --   --   --   PHOS  --  2.2* 2.8  --   --   --   --    GFR: Estimated Creatinine Clearance: 64.7 mL/min (by C-G formula based on SCr of 1.13 mg/dL). Liver Function Tests: Recent Labs  Lab 03/30/20 0510  AST 14*  ALT 16  ALKPHOS 49  BILITOT 0.8  PROT 5.7*  ALBUMIN 3.6   No results for input(s): LIPASE,  AMYLASE in the last 168 hours. No results for input(s): AMMONIA in the last 168 hours. Coagulation Profile: No results for input(s): INR, PROTIME in the last 168 hours. Cardiac Enzymes: Recent Labs  Lab 03/29/20 1440  CKTOTAL 66   BNP (last 3 results) No results for input(s): PROBNP in the last 8760 hours. HbA1C: Recent Labs    03/29/20 1948  HGBA1C 10.4*   CBG: Recent Labs  Lab 03/31/20 1516 03/31/20 2014 04/01/20 0029 04/01/20 0416 04/01/20 0752  GLUCAP 182* 286* 223* 155* 144*   Lipid Profile: Recent Labs    03/31/20 0436  CHOL 131  HDL 27*  LDLCALC 60  TRIG 454*  CHOLHDL 4.9   Thyroid Function Tests: Recent Labs    03/30/20 0510  TSH 0.713   Anemia Panel: No results for input(s): VITAMINB12, FOLATE, FERRITIN, TIBC, IRON, RETICCTPCT in the last 72 hours. Sepsis Labs: Recent Labs  Lab 03/29/20 1948 03/29/20 2324 03/30/20 0510  LATICACIDVEN 4.1* 2.2* 1.7    Recent Results (from the past 240 hour(s))  SARS Coronavirus 2 by RT PCR (hospital order, performed in [facility] hospital lab) Nasopharyngeal Nasopharyngeal Swab     Status: None   Collection Time: 03/29/20  6:34 PM   Specimen: Nasopharyngeal Swab  Result Value Ref Range Status   SARS Coronavirus 2 NEGATIVE NEGATIVE Final    Comment: (NOTE) SARS-CoV-2 target nucleic acids are NOT DETECTED.  The SARS-CoV-2 RNA is generally detectable in upper and lower respiratory specimens during the acute phase of infection. The  lowest concentration of SARS-CoV-2 viral copies this assay can detect is 250 copies / mL. A negative result does not preclude SARS-CoV-2 infection and should not be used as the sole basis for treatment or other patient management decisions.  A negative result may occur with improper specimen collection / handling, submission of specimen other than nasopharyngeal swab, presence of viral mutation(s) within the areas targeted by this assay, and inadequate number of viral copies (<250 copies / mL). A negative result must be combined with clinical observations, patient history, and epidemiological information.  Fact Sheet for Patients:   BoilerBrush.com.cy  Fact Sheet for Healthcare Providers: https://pope.com/  This test is not yet approved or  cleared by the [place] FDA and has been authorized for detection and/or diagnosis of SARS-CoV-2 by FDA under an Emergency Use Authorization (EUA).  This EUA will remain in effect (meaning this test can be used) for the duration of the COVID-19 declaration under Section 564(b)(1) of the Act, 21 U.S.C. section 360bbb-3(b)(1), unless the authorization is terminated or revoked sooner.  Performed at [facility], [address], [place], [place] [address]   MRSA PCR Screening     Status: None   Collection Time: 03/31/20  5:40 AM   Specimen: Nasal Mucosa; Nasopharyngeal  Result Value Ref Range Status   MRSA by PCR NEGATIVE NEGATIVE Final    Comment:        The GeneXpert MRSA Assay (FDA approved for NASAL specimens only), is one component of a comprehensive MRSA colonization surveillance program. It is not intended to diagnose MRSA infection nor to guide or monitor treatment for MRSA infections. Performed at [facility], [address], [place], [place] [address]          Radiology Studies: CARDIAC CATHETERIZATION  Result Date: 03/31/2020   Ischemic cardiomyopathy with dilated LV and estimated ejection fraction 25%.  End-diastolic pressure between 18 and 21 mmHg consistent with chronic systolic heart failure.  Severe  diffuse three-vessel coronary artery disease.  Left main is widely patent  LAD gives origin to a large diagonal which contains 90% proximal to mid stenosis in the entire mid to distal LAD is diffusely diseased in segments up to 90%.  Severe disease in the circumflex coronary artery with mid to distal high-grade multifocal stenosis in the large first obtuse marginal.  The second obtuse marginal is totally occluded.  The third obtuse marginal as mild to moderate diffuse disease without focal severe obstruction.  Right coronary is dominant.  Distal 50% RCA stenosis.  Diffuse high-grade proximal and distal PDA stenoses greater than 90% and subtotal occlusion of the continuation of the right coronary with small left ventricular branch is noted.  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  If so, would be reasonable to ask the cardiac surgical team whether the patient has adequate targets for grafting (seems unlikely to me).  There are no reasonable PCI options.  Medical therapy for heart failure, strong anti-ischemic regimen, and consideration of resynchronization/ICD.    ECHOCARDIOGRAM COMPLETE  Result Date: 03/30/2020    ECHOCARDIOGRAM REPORT   Patient Name:   Ryan [name] Date of Exam: 03/30/2020 Medical Rec #:  [id number]           Height:       74.0 in Accession #:    [id number]          Weight:       184.0 lb Date of Birth:  1940-11-10           BSA:          2.098 m Patient Age:    78 years            BP:           144/90 mmHg Patient Gender: M                   HR:           62 bpm. Exam Location:  Inpatient Procedure: 2D Echo, Cardiac Doppler and Color  Doppler Indications:    Syncope  History:        Patient has no prior history of Echocardiogram examinations.                 Arrythmias:LBBB, Signs/Symptoms:Syncope; Risk                 Factors:Hypertension, Diabetes and Dyslipidemia.  Sonographer:    Lavenia Atlas Referring Phys: [phone] ANASTASSIA DOUTOVA IMPRESSIONS  1. Left ventricular ejection fraction, by estimation, is 20 to 25%. The left ventricle has severely decreased function. The left ventricle demonstrates global hypokinesis. The left ventricular internal cavity size was moderately dilated. There is moderate left ventricular hypertrophy. Left ventricular diastolic parameters are consistent with Grade II diastolic dysfunction (pseudonormalization). Elevated left atrial pressure.  2. Right ventricular systolic function is normal. The right ventricular size is normal.  3. The mitral valve is normal in structure. Mild mitral valve regurgitation. No evidence of mitral stenosis.  4. The aortic valve is tricuspid. Aortic valve regurgitation is not visualized. Mild aortic valve sclerosis is present, with no evidence of aortic valve stenosis.  5. Aortic dilatation noted. 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 FINDINGS  Left Ventricle: Left ventricular ejection fraction, by estimation, is 20 to 25%. The left ventricle has  severely decreased function. The left ventricle demonstrates global hypokinesis. The left ventricular internal cavity size was moderately dilated. There is moderate left ventricular hypertrophy. Left ventricular diastolic parameters are consistent with Grade II diastolic dysfunction (pseudonormalization). Elevated left atrial pressure. Right Ventricle: The right ventricular size is normal. Right ventricular systolic function is normal. Left Atrium: Left atrial size was normal in size. Right Atrium: Right atrial size was normal in size. Pericardium: There is no evidence of pericardial effusion. Mitral Valve: The mitral  valve is normal in structure. Normal mobility of the mitral valve leaflets. Mild mitral valve regurgitation. No evidence of mitral valve stenosis. Tricuspid Valve: The tricuspid valve is normal in structure. Tricuspid valve regurgitation is trivial. No evidence of tricuspid stenosis. Aortic Valve: The aortic valve is tricuspid. Aortic valve regurgitation is not visualized. Mild aortic valve sclerosis is present, with no evidence of aortic valve stenosis. Pulmonic Valve: The pulmonic valve was normal in structure. Pulmonic valve regurgitation is mild. No evidence of pulmonic stenosis. Aorta: Aortic dilatation noted. 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 IAS/Shunts: No atrial level shunt detected by color flow Doppler.  LEFT VENTRICLE PLAX 2D LVIDd:         6.10 cm  Diastology LVIDs:         5.00 cm  LV e' lateral:   7.35 cm/s LV PW:         1.50 cm  LV E/e' lateral: 9.4 LV IVS:        1.50 cm  LV e' medial:    3.68 cm/s LVOT diam:     2.80 cm  LV E/e' medial:  18.8 LV SV:         72 LV SV Index:   34 LVOT Area:     6.16 cm  RIGHT VENTRICLE RV Basal diam:  3.50 cm RV S prime:     10.80 cm/s TAPSE (M-mode): 2.7 cm LEFT ATRIUM             Index       RIGHT ATRIUM           Index LA diam:        4.50 cm 2.15 cm/m  RA Area:     23.80 cm LA Vol (A2C):   52.9 ml 25.22 ml/m RA Volume:   73.70 ml  35.13 ml/m LA Vol (A4C):   44.7 ml 21.31 ml/m LA Biplane Vol: 50.9 ml 24.26 ml/m  AORTIC VALVE LVOT Vmax:   62.00 cm/s LVOT Vmean:  41.200 cm/s LVOT VTI:    0.117 m  AORTA Ao Root diam: 3.80 cm MITRAL VALVE               TRICUSPID VALVE MV Area (PHT): 3.17 cm    TR Peak grad:   9.4 mmHg MV Decel Time: 239 msec    TR Vmax:        153.00 cm/s MV E velocity: 69.00 cm/s MV A velocity: 39.40 cm/s  SHUNTS MV E/A ratio:  1.75        Systemic VTI:  0.12 m                            Systemic Diam: 2.80 cm Olga Millers MD Electronically signed by Olga Millers MD Signature Date/Time: 03/30/2020/4:01:32 PM     Final    VAS US CAROTID  Result Date: 03/31/2020 Carotid Arterial Duplex Study Indications:  Syncope. Risk Factors:      Hypertension, hyperlipidemia, Diabetes. Comparison Study:  No prior studies. Performing Technologist: Jean Rosenthal  Examination Guidelines: A complete evaluation includes B-mode imaging, spectral Doppler, color Doppler, and power Doppler as needed of all accessible portions of each vessel. Bilateral testing is considered an integral part of a complete examination. Limited examinations for reoccurring indications may be performed as noted.  Right Carotid Findings: +----------+--------+--------+--------+------------------+------------------+           PSV cm/sEDV cm/sStenosisPlaque DescriptionComments           +----------+--------+--------+--------+------------------+------------------+ CCA Prox  62      9                                 intimal thickening +----------+--------+--------+--------+------------------+------------------+ CCA Distal58      9                                 intimal thickening +----------+--------+--------+--------+------------------+------------------+ ICA Prox  40      10      1-39%                                        +----------+--------+--------+--------+------------------+------------------+ ICA Distal26      9                                                    +----------+--------+--------+--------+------------------+------------------+ ECA       71      9                                                    +----------+--------+--------+--------+------------------+------------------+ +----------+--------+-------+----------------+-------------------+           PSV cm/sEDV cmsDescribe        Arm Pressure (mmHG) +----------+--------+-------+----------------+-------------------+ [id number]             Multiphasic, WNL                    +----------+--------+-------+----------------+-------------------+  +---------+--------+--+--------+-+---------+ VertebralPSV cm/s25EDV cm/s7Antegrade +---------+--------+--+--------+-+---------+  Left Carotid Findings: +----------+--------+--------+--------+------------------+------------------+           PSV cm/sEDV cm/sStenosisPlaque DescriptionComments           +----------+--------+--------+--------+------------------+------------------+ CCA Prox  59      11                                intimal thickening +----------+--------+--------+--------+------------------+------------------+ CCA Distal63      17                                intimal thickening +----------+--------+--------+--------+------------------+------------------+ ICA Prox  42      11      1-39%                                        +----------+--------+--------+--------+------------------+------------------+  ICA Distal61      17                                                   +----------+--------+--------+--------+------------------+------------------+ ECA       77      9                                                    +----------+--------+--------+--------+------------------+------------------+ +----------+--------+--------+----------------+-------------------+           PSV cm/sEDV cm/sDescribe        Arm Pressure (mmHG) +----------+--------+--------+----------------+-------------------+ [id number]              Multiphasic, WNL                    +----------+--------+--------+----------------+-------------------+ +---------+--------+--+--------+--+---------+ VertebralPSV cm/s44EDV cm/s11Antegrade +---------+--------+--+--------+--+---------+   Summary: Right Carotid: Velocities in the right ICA are consistent with a 1-39% stenosis. Left Carotid: Velocities in the left ICA are consistent with a 1-39% stenosis. Vertebrals:  Bilateral vertebral arteries demonstrate antegrade flow. Subclavians: Normal flow hemodynamics were seen in  bilateral subclavian              arteries. *See table(s) above for measurements and observations.  Electronically signed by Delia HeadyPramod Sethi MD on 03/31/2020 at 8:48:53 AM.    Final         Scheduled Meds: . aspirin  81 mg Oral Daily  . atorvastatin  80 mg Oral QHS  . docusate sodium  100 mg Oral BID  . enoxaparin (LOVENOX) injection  40 mg Subcutaneous Q24H  . gabapentin  300 mg Oral TID  . insulin aspart  0-9 Units Subcutaneous Q4H  . losartan  50 mg Oral Daily  . PARoxetine  40 mg Oral Daily  . sodium chloride flush  3 mL Intravenous Q12H  . sodium chloride flush  3 mL Intravenous Q12H  . spironolactone  12.5 mg Oral Daily   Continuous Infusions: . sodium chloride       LOS: 1 day    Time spent: 25 mins,More than 50% of that time was spent in counseling and/or coordination of care.      Burnadette PopAmrit Ociel Retherford, MD [facility] P7/14/2021, 9:12 AM

## 2020-04-01 NOTE — Progress Notes (Signed)
OT Cancellation Note  Patient Details Name: Ryan [name] MRN: [id number] DOB: [birth year]   Cancelled Treatment:    Reason Eval/Treat Not Completed: Patient at procedure or test/ unavailable (Cardiac MRI). OT will continue efforts when patient is available.   Kallie Edward OTR/L Supplemental OT, Department of rehab services [phone]  Everest Brod R H.  04/01/2020, 9:31 AM

## 2020-04-01 NOTE — Progress Notes (Signed)
Inpatient Diabetes Program Recommendations  AACE/ADA: New Consensus Statement on Inpatient Glycemic Control (2015)  Target Ranges:  Prepandial:   less than 140 mg/dL      Peak postprandial:   less than 180 mg/dL (1-2 hours)      Critically ill patients:  140 - 180 mg/dL   Lab Results  Component Value Date   GLUCAP 213 (H) 04/01/2020   HGBA1C 10.4 (H) 03/29/2020    Review of Glycemic Control Results for Ryan [name], Ryan [name] (MRN [id number]) as of 04/01/2020 14:13  Ref. Range 03/31/2020 20:14 04/01/2020 00:29 04/01/2020 04:16 04/01/2020 07:52 04/01/2020 12:03  Glucose-Capillary Latest Ref Range: 70 - 99 mg/dL 175 (H) 102 (H) 585 (H) 144 (H) 213 (H)   Diabetes history: DM 2 Outpatient Diabetes medications: Amaryl 2 mg Daily, Metformin 1000 mg bid Current orders for Inpatient glycemic control:  Novolog 0-9 units Q4 hours Inpatient Diabetes Program Recommendations:    Please consider changing Novolog correction to tid with meals and HS (since patient is on PO diet).  Also, consider adding Lantus 15 units daily.   Thanks,  Beryl Meager, RN, BC-ADM Inpatient Diabetes Coordinator Pager [phone] (8a-5p)

## 2020-04-01 NOTE — Plan of Care (Signed)

## 2020-04-01 NOTE — Evaluation (Addendum)
Occupational Therapy Evaluation Patient Details Name: Ryan [name] MRN: [id number] DOB: [birth year] Today's Date: 04/01/2020    History of Present Illness 79 y.o. male presenting with Hx of syncope and L bundle heart failure s/p R/L heart cath and coronary angiography. PMHx significant for HTN, poorly controlled DM II, peripheral neuropathy, anxiety, HLD, and LBBB.    Clinical Impression   PTA, patient was living with his adult son in a 2-level private residence with full flight of stairs to bedroom/bathroom on 2nd level and was independent with BADLs without use of assistive device. Patient currently presents slightly below baseline level of function secondary to decreased dynamic standing balance and questionable safety awareness. Patient also currently requiring supervision A grossly for BADLs including bathing sitting/standing in walk-in shower of hospital room, LB dressing in sitting/standing, and mobility in room. Patient would benefit from 1 follow-up acute OT visit for education on safe transition to prior level of living. Patient does not demonstrate need for follow-up OT services post d/c.     Follow Up Recommendations  No OT follow up    Equipment Recommendations  None recommended by OT    Recommendations for Other Services       Precautions / Restrictions Precautions Precautions: Fall Restrictions Weight Bearing Restrictions: No      Mobility Bed Mobility               General bed mobility comments: Patient seated EOB upon entry.   Transfers Overall transfer level: Needs assistance Equipment used: None Transfers: Sit to/from Stand Sit to Stand: Modified independent (Device/Increase time)         General transfer comment: Use of BUE on bed surface > BSC > recliner.     Balance Overall balance assessment: Needs assistance Sitting-balance support: Feet supported Sitting balance-Leahy Scale: Good Sitting balance - Comments: Patient able to thread  BLE without UE support or LOB.    Standing balance support: No upper extremity supported;During functional activity Standing balance-Leahy Scale: Fair Standing balance comment: Initial hand held assist for steadying                           ADL either performed or assessed with clinical judgement   ADL Overall ADL's : Needs assistance/impaired Eating/Feeding: Independent   Grooming: Supervision/safety Grooming Details (indicate cue type and reason): Hand hygiene standing at sink level  Upper Body Bathing: Supervision/ safety;Sitting Upper Body Bathing Details (indicate cue type and reason): Seated on BSC in walk-in shower Lower Body Bathing: Supervison/ safety;Sit to/from stand Lower Body Bathing Details (indicate cue type and reason): In sitting/standing with use of grab bar Upper Body Dressing : Set up;Sitting Upper Body Dressing Details (indicate cue type and reason): To don anterior hospital gown in sitting.  Lower Body Dressing: Supervision/safety;Sit to/from stand Lower Body Dressing Details (indicate cue type and reason): Pt. able to doff LB clothing in standing with use of grab bar and supervision A. LB dressing seated in recliner with cues for safety and activity pacing.  Toilet Transfer: Supervision/safety           Functional mobility during ADLs: Min guard General ADL Comments: Initial Min guard to steady 2/2 balance deficits.      Vision Baseline Vision/History: Wears glasses Wears Glasses: Reading only Patient Visual Report: No change from baseline Vision Assessment?: No apparent visual deficits     Perception     Praxis      Pertinent Vitals/Pain Pain Assessment: No/denies pain  Hand Dominance Left   Extremity/Trunk Assessment Upper Extremity Assessment Upper Extremity Assessment: Overall WFL for tasks assessed   Lower Extremity Assessment Lower Extremity Assessment: Defer to PT evaluation       Communication     Cognition  Arousal/Alertness: Awake/alert Behavior During Therapy: WFL for tasks assessed/performed Overall Cognitive Status: Within Functional Limits for tasks assessed                                     General Comments  Son present at bedside.     Exercises     Shoulder Instructions      Home Living Family/patient expects to be discharged to:: Private residence Living Arrangements: Children Available Help at Discharge: Family;Available PRN/intermittently Type of Home: House Home Access: Stairs to enter [employer] of Steps: 4 Entrance Stairs-Rails: Can reach both Home Layout: Two level;Able to live on main level with bedroom/bathroom     Bathroom Shower/Tub: Chief Strategy Officer: Standard     Home Equipment: Shower seat          Prior Functioning/Environment Level of Independence: Independent        Comments: Son was responsible for cooking/cleaning. Patient was driving.         OT Problem List: Impaired balance (sitting and/or standing);Decreased knowledge of use of DME or AE      OT Treatment/Interventions:      OT Goals(Current goals can be found in the care plan section) Acute Rehab OT Goals Patient Stated Goal: To return home OT Goal Formulation: With patient Time For Goal Achievement: 04/15/20 Potential to Achieve Goals: Good ADL Goals Pt Will Perform Tub/Shower Transfer: with supervision Additional ADL Goal #1: Patient will recall and demonstrate 3 energy conservation techniques in prep for safe completion of BADLs.  OT Frequency:     Barriers to D/C:            Co-evaluation              AM-PAC OT "6 Clicks" Daily Activity     Outcome Measure Help from another person eating meals?: None Help from another person taking care of personal grooming?: A Little Help from another person toileting, which includes using toliet, bedpan, or urinal?: A Little Help from another person bathing (including washing,  rinsing, drying)?: A Little Help from another person to put on and taking off regular upper body clothing?: None Help from another person to put on and taking off regular lower body clothing?: A Little 6 Click Score: 20   End of Session    Activity Tolerance: Patient tolerated treatment well Patient left: in chair;with call bell/phone within reach  OT Visit Diagnosis: Unsteadiness on feet (R26.81);History of falling (Z91.81)                Time: 2395-3202 OT Time Calculation (min): 33 min Charges:  OT General Charges $OT Visit: 1 Visit OT Evaluation $OT Eval Low Complexity: 1 Low OT Treatments $Self Care/Home Management : 8-22 mins  Beatric Fulop H. OTR/L Supplemental OT, Department of rehab services [phone]   Isaac Bliss R [place]Davis 04/01/2020, 1:47 PM
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
## 2020-04-02 ENCOUNTER — Inpatient Hospital Stay (HOSPITAL_COMMUNITY): Payer: Medicare Other

## 2020-04-02 ENCOUNTER — Telehealth: Payer: Self-pay | Admitting: Internal Medicine

## 2020-04-02 DIAGNOSIS — I10 Essential (primary) hypertension: Secondary | ICD-10-CM

## 2020-04-02 DIAGNOSIS — I25110 Atherosclerotic heart disease of native coronary artery with unstable angina pectoris: Secondary | ICD-10-CM

## 2020-04-02 DIAGNOSIS — E119 Type 2 diabetes mellitus without complications: Secondary | ICD-10-CM

## 2020-04-02 LAB — GLUCOSE, CAPILLARY
Glucose-Capillary: 191 mg/dL — ABNORMAL HIGH (ref 70–99)
Glucose-Capillary: 169 mg/dL — ABNORMAL HIGH (ref 70–99)
Glucose-Capillary: 214 mg/dL — ABNORMAL HIGH (ref 70–99)
Glucose-Capillary: 215 mg/dL — ABNORMAL HIGH (ref 70–99)
Glucose-Capillary: 258 mg/dL — ABNORMAL HIGH (ref 70–99)

## 2020-04-02 MED ORDER — INSULIN ASPART 100 UNIT/ML ~~LOC~~ SOLN
0.0000 [IU] | Freq: Three times a day (TID) | SUBCUTANEOUS | Status: DC
  Administered 2020-04-02 – 2020-04-03 (×2): 3 [IU] via SUBCUTANEOUS
  Administered 2020-04-03: 2 [IU] via SUBCUTANEOUS

## 2020-04-02 MED ORDER — INSULIN GLARGINE 100 UNIT/ML ~~LOC~~ SOLN
15.0000 [IU] | Freq: Every day | SUBCUTANEOUS | Status: DC
  Administered 2020-04-02 – 2020-04-03 (×2): 15 [IU] via SUBCUTANEOUS
  Filled 2020-04-02 (×2): qty 0.15

## 2020-04-02 MED ORDER — INSULIN ASPART 100 UNIT/ML ~~LOC~~ SOLN
0.0000 [IU] | Freq: Every day | SUBCUTANEOUS | Status: DC
  Administered 2020-04-02: 2 [IU] via SUBCUTANEOUS

## 2020-04-02 MED ORDER — TECHNETIUM TC 99M PYROPHOSPHATE
20.0000 | Freq: Once | INTRAVENOUS | Status: AC | PRN
  Administered 2020-04-02: 20 via INTRAVENOUS
  Filled 2020-04-02: qty 20

## 2020-04-02 NOTE — Telephone Encounter (Signed)
Spoke with patient.  Patient will call back as needed to reschedule his appointments.

## 2020-04-02 NOTE — Consult Note (Addendum)
[address]       [place] [address]             [phone]        [name] Medical Record #[id number] Date of Birth: Oct 09, 1940  Referring: No ref. provider found Primary Care: Ryan [name], Ryan Patricia, MD Primary Cardiologist:Traci Mayford Knife, MD  Chief Complaint:    Chief Complaint  Patient presents with  . Loss of Consciousness    History of Present Illness:      [name] is a 79 year old male patient who had a past medical history of Diabetes Mellitus Type 2 with neuropathy, BPH, hypertension, and ischemic cardiomyopathy who presents to [facility] with a syncopal episode. The patient was in the grocery store and lost consciousness, hit his chin on the shopping cart, and he fell under the cart scrapping up his knees and leg. This has happened in the past, but had been due to either hypotension or hypoglycemia. This time, his blood glucose was over 300. The patient was extremely weak and could not get back up after gaining consciousness. He stated that he did have some chest pain which radiated to his arm. The pain subsided after a few minutes. EKG showed a left bundle branch block.  A cardiac catheterization was done showing severe three vessel coronary artery disease. An echocardiogram was done which showed estimated ejection fraction of 20-25% with global hypokinesis. There was no valvular disease shown. Cardiac MRI was done for viability determination. It showed mixed ischemic and nonischemic cardiomyopathy. 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 Due to a dense midwall late gadolinium enhancement in the basal to mid septum, Amyloidosis would be in the differential. Therefore, PYP scan was ordered to r/o Amyloidosis. We are consulted for possible coronary artery bypass grafting.  The patient has been active at home walking about 1 mile a day but since it became hot outside, he has stopped. He does admit to his diet being poor with  him eating processed foods and cheese burgers. He does not smoke or drink alcohol.      Current Activity/ Functional Status: Patient was independent with mobility/ambulation, transfers, ADL's, IADL's.   Zubrod Score: At the time of surgery this patient's most appropriate activity status/level should be described as:     0    Normal activity, no symptoms     1    Restricted in physical strenuous activity but ambulatory, able to do out light work     2    Ambulatory and capable of self care, unable to do work activities, up and about                 more than 50%  Of the time                                3    Only limited self care, in bed greater than 50% of waking hours     4    Completely disabled, no self care, confined to bed or chair     5    Moribund  Past Medical History:  Diagnosis Date  . B12 deficiency   . BPH (benign prostatic hyperplasia)   . DM (diabetes mellitus), type 2, uncontrolled w/neurologic complication (HCC)   . GAD (generalized anxiety disorder)   . HTN (hypertension)   . Hyperlipidemia   . Neuropathy, peripheral  Past Surgical History:  Procedure Laterality Date  . RIGHT/LEFT HEART CATH AND CORONARY ANGIOGRAPHY N/A 03/31/2020   Procedure: RIGHT/LEFT HEART CATH AND CORONARY ANGIOGRAPHY;  Surgeon: Lyn Records, MD;  Location: [facility] INVAS[facility];  Service: Cardiovascular;  Laterality: N/A;    Social History   Tobacco Use  Smoking Status Never Smoker  Smokeless Tobacco Never Used    Social History   Substance and Sexual Activity  Alcohol Use Never     Allergies  Allergen Reactions  . Sulfa Antibiotics     Blood in Urine    Current Facility-Administered Medications  Medication Dose Route Frequency Provider Last Rate Last Admin  . 0.9 %  sodium chloride infusion  250 mL Intravenous PRN Lyn Records, MD      . acetaminophen (TYLENOL) tablet 650 mg  650 mg Oral Q4H PRN Lyn Records, MD      . aspirin chewable tablet 81 mg   81 mg Oral Daily Lyn Records, MD   81 mg at 04/02/20 0807  . atorvastatin (LIPITOR) tablet 80 mg  80 mg Oral QHS Quintella Reichert, MD   80 mg at 04/01/20 2136  . docusate sodium (COLACE) capsule 100 mg  100 mg Oral BID Lyn Records, MD   100 mg at 04/02/20 0804  . enoxaparin (LOVENOX) injection 40 mg  40 mg Subcutaneous Q24H Lyn Records, MD   40 mg at 04/02/20 0805  . gabapentin (NEURONTIN) capsule 300 mg  300 mg Oral TID Lyn Records, MD   300 mg at 04/02/20 0804  . HYDROcodone-acetaminophen (NORCO/VICODIN) 5-325 MG per tablet 1-2 tablet  1-2 tablet Oral Q4H PRN Lyn Records, MD      . insulin aspart (novoLOG) injection 0-9 Units  0-9 Units Subcutaneous Q4H Lyn Records, MD   3 Units at 04/02/20 1149  . losartan (COZAAR) tablet 50 mg  50 mg Oral Daily Lyn Records, MD   50 mg at 04/02/20 0805  . ondansetron (ZOFRAN) tablet 4 mg  4 mg Oral Q6H PRN Lyn Records, MD       Or  . ondansetron [facility]) injection 4 mg  4 mg Intravenous Q6H PRN Lyn Records, MD      . ondansetron [facility]) injection 4 mg  4 mg Intravenous Q6H PRN Lyn Records, MD      . oxyCODONE (Oxy IR/ROXICODONE) immediate release tablet 5-10 mg  5-10 mg Oral Q4H PRN Lyn Records, MD      . PARoxetine (PAXIL) tablet 40 mg  40 mg Oral Daily Lyn Records, MD   40 mg at 04/02/20 0804  . sodium chloride flush (NS) 0.9 % injection 3 mL  3 mL Intravenous Q12H Lyn Records, MD   3 mL at 04/01/20 2137  . sodium chloride flush (NS) 0.9 % injection 3 mL  3 mL Intravenous Q12H Lyn Records, MD   3 mL at 04/02/20 0808  . sodium chloride flush (NS) 0.9 % injection 3 mL  3 mL Intravenous PRN Lyn Records, MD      . spironolactone (ALDACTONE) tablet 12.5 mg  12.5 mg Oral Daily Lyn Records, MD   12.5 mg at 04/02/20 0804  . zolpidem (AMBIEN) tablet 5 mg  5 mg Oral QHS PRN Marcelino Duster, PA   5 mg at 04/01/20 2135    Medications Prior to Admission  Medication Sig Dispense Refill Last Dose  . atorvastatin  (LIPITOR)  40 MG tablet TAKE ONE TABLET BY MOUTH EVERY NIGHT AT BEDTIME 90 tablet 1 03/28/2020 at Unknown time  . cyanocobalamin (,VITAMIN B-12,) 1000 MCG/ML injection inject 1 ml once a month 6 mL 3 Past Month at Unknown time  . gabapentin (NEURONTIN) 300 MG capsule TAKE ONE CAPSULE BY MOUTH THREE TIMES A DAY 270 capsule 2 03/28/2020 at Unknown time  . glimepiride (AMARYL) 2 MG tablet Take 1 tablet (2 mg total) by mouth 2 (two) times daily. 180 tablet 1 Past Month at Unknown time  . lisinopril-hydrochlorothiazide (ZESTORETIC) 20-25 MG tablet TAKE ONE TABLET BY MOUTH DAILY 90 tablet 0 03/28/2020 at Unknown time  . metFORMIN (GLUCOPHAGE) 1000 MG tablet TAKE ONE TABLET BY MOUTH TWICE A DAY WITH MEALS 180 tablet 0 03/28/2020 at Unknown time  . PARoxetine (PAXIL) 40 MG tablet Take 1 tablet (40 mg total) by mouth every morning. 90 tablet 1 03/28/2020 at Unknown time  . tamsulosin (FLOMAX) 0.4 MG CAPS capsule Take 1 capsule (0.4 mg total) by mouth daily. 90 capsule 1 03/28/2020 at Unknown time  . cyanocobalamin (,VITAMIN B-12,) 1000 MCG/ML injection Inject 1 mL (1,000 mcg total) into the muscle every 30 (thirty) days. (Patient not taking: Reported on 03/29/2020) 6 mL 1 Not Taking at Unknown time  . Dulaglutide (TRULICITY) 0.75 MG/0.5ML SOPN Inject 0.75 mg into the skin once a week. (Patient not taking: Reported on 03/29/2020) 4 pen 3 Not Taking at Unknown time  . SYRINGE-NEEDLE, DISP, 3 ML (BD SAFETYGLIDE SYRINGE/NEEDLE) 25G X 1" 3 ML MISC Use for B12 injections 100 each 11   . traMADol (ULTRAM) 50 MG tablet Take 1 tablet (50 mg total) by mouth every 12 (twelve) hours as needed for moderate pain. (Patient not taking: Reported on 03/29/2020) 45 tablet 0 Completed Course at Unknown time  . zolpidem (AMBIEN) 5 MG tablet Take 1 tablet (5 mg total) by mouth at bedtime as needed for sleep. (Patient not taking: Reported on 03/29/2020) 30 tablet 1 Completed Course at Unknown time    Family History  Problem Relation Age of  Onset  . Diabetes Mother      Review of Systems:   Review of Systems  Constitutional: Positive for chills and fever.  Respiratory: Negative for cough, sputum production, shortness of breath and wheezing.   Cardiovascular: Negative for leg swelling.  Gastrointestinal: Positive for abdominal pain. Negative for nausea and vomiting.  Musculoskeletal: Positive for falls and joint pain.  Neurological: Positive for loss of consciousness (syncopal episode).  Endo/Heme/Allergies: Bruises/bleeds easily.   Pertinent items are noted in HPI.      Physical Exam: BP 132/79 (BP Location: Left Arm)   Pulse 70   Temp 98.4 F (36.9 C) (Oral)   Resp 16   Ht 6\' 2"  (1.88 m)   Wt 90.3 kg   SpO2 96%   BMI 25.56 kg/m    General appearance: alert, cooperative and no distress Resp: clear to auscultation bilaterally Cardio: regular rate and rhythm, S1, S2 normal, no murmur, click, rub or gallop GI: soft, non-tender; bowel sounds normal; no masses,  no organomegaly Extremities: extremities normal, atraumatic, no cyanosis or edema Neurologic: Grossly normal  Diagnostic Studies & Laboratory data:   NM CARDIAC AMYLOID TUMOR LOC INFLAM SPEC 1 ... pending    Recent Radiology Findings:   CARDIAC CATHETERIZATION  Result Date: 03/31/2020  Ischemic cardiomyopathy with dilated LV and estimated ejection fraction 25%.  End-diastolic pressure between 18 and 21 mmHg consistent with chronic systolic heart failure.  Severe diffuse  three-vessel coronary artery disease.  Left main is widely patent  LAD gives origin to a large diagonal which contains 90% proximal to mid stenosis in the entire mid to distal LAD is diffusely diseased in segments up to 90%.  Severe disease in the circumflex coronary artery with mid to distal high-grade multifocal stenosis in the large first obtuse marginal.  The second obtuse marginal is totally occluded.  The third obtuse marginal as mild to moderate diffuse disease without focal  severe obstruction.  Right coronary is dominant.  Distal 50% RCA stenosis.  Diffuse high-grade proximal and distal PDA stenoses greater than 90% and subtotal occlusion of the continuation of the right coronary with small left ventricular branch is noted.  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  If so, would be reasonable to ask the cardiac surgical team whether the patient has adequate targets for grafting (seems unlikely to me).  There are no reasonable PCI options.  Medical therapy for heart failure, strong anti-ischemic regimen, and consideration of resynchronization/ICD.    MR CARDIAC MORPHOLOGY W WO CONTRAST  Result Date: 04/02/2020 CLINICAL DATA:  59M with severe systolic heart failure and severe multivessel CAD. Evaluate myocardial viability EXAM: CARDIAC MRI TECHNIQUE: The patient was scanned on a 1.5 Tesla Siemens magnet. A dedicated cardiac coil was used. Functional imaging was done using Fiesta sequences. 2,3, and 4 chamber views were done to assess for RWMA's. Modified Simpson's rule using a short axis stack was used to calculate an ejection fraction on a dedicated work [employer]. The patient received 10 cc of Gadavist. After 10 minutes inversion recovery sequences were used to assess for infiltration and scar tissue. CONTRAST:  10 cc  of Gadavist FINDINGS: Left ventricle: -Focal asymmetric hypertrophy measuring up to 33mm in mid inferoseptum (38mm in posterior wall) -Moderate dilatation -Moderate systolic dysfunction. Septal dyskinesis consistent with LBBB -Elevated ECV (35%).  Markedly elevated ECV in basal septum (65%) -Basal to mid dense midwall LGE -Subendocardial LGE (>50% transmural) in basal inferior/lateral walls LV EF: 33% (Normal 56-78%) Absolute volumes: LV EDV: (Normal 77-195 mL) LV ESV:  (Normal 19-72 mL) LV SV: 68mL (Normal 51-133 mL) CO: 6.0L/min (Normal 2.8-8.8 L/min) Indexed volumes: LV EDV: 128mL/sq-m (Normal 47-92 mL/sq-m) LV ESV: 37mL/sq-m (Normal 13-30 mL/sq-m) LV SV: 26mL/sq-m (Normal 32-62 mL/sq-m) CI: 2.8L/min/sq-m (Normal 1.7-4.2 L/min/sq-m) Right ventricle: Normal size and systolic function RV EF:  57% (Normal 47-74%) Absolute volumes: RV EDV: (Normal 88-227 mL) RV ESV: 83mL (Normal 23-103 mL) RV SV: 20mL (Normal 52-138 mL) CO: 5.6L/min (Normal 2.8-8.8 L/min) Indexed volumes: RV EDV: 75mL/sq-m (Normal 55-105 mL/sq-m) RV ESV: 105mL/sq-m (Normal 15-43 mL/sq-m) RV SV: 42mL/sq-m (Normal 32-64 mL/sq-m) CI: 2.6L/min/sq-m (Normal 1.7-4.2 L/min/sq-m) Left atrium: Mild enlargement Right atrium: Normal size Mitral valve: No regurgitation Aortic valve: Tricuspid. No regurgitation Tricuspid valve: No regurgitation Pulmonic valve: No regurgitation Aorta: Mild dilation of ascending aorta measuring 20mm Pericardium: Normal IMPRESSION: 1. 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 Focal asymmetric hypertrophy measuring up to 75mm in mid inferoseptum (20mm in posterior wall), which meets criteria for hypertrophic cardiomyopathy. In addition, there is dense midwall late gadolinium enhancement in the basal to mid septum. This is consistent with HCM. Amyloidosis would also be on differential, as can present with asymmetric hypertrophy and similar LGE pattern. ECV is markedly elevated in the basal septum (65%)  but in areas without LGE while the ECV is elevated (35%), it is not in amyloid range (<40%). Suspect HCM as the diagnosis, but would consider checking serum light chains and PYP scan to rule out amyloid 3. Subendocardial LGE consistent with prior infarct in portion of the basal inferior/lateral walls. >50% transmural LGE suggests nonviability in this area. Remainder of myocardium appears viable 4. Moderate LV dilatation with moderate systolic dysfunction (EF 33%) 5.   Normal RV size and systolic function (EF 57%) Electronically Signed   By: Epifanio Lescheshristopher  Schumann MD   On: 04/02/2020 00:13     I have independently reviewed the above radiologic studies and discussed with the patient   Recent Lab Findings: Lab Results  Component Value Date   WBC 7.0 03/31/2020   HGB 12.7 (L) 03/31/2020   HCT 37.1 (L) 03/31/2020   PLT 196 03/31/2020   GLUCOSE 175 (H) 03/31/2020   CHOL 131 03/31/2020   TRIG 220 (H) 03/31/2020   HDL 27 (L) 03/31/2020   LDLDIRECT 56.0 01/07/2020   LDLCALC 60 03/31/2020   ALT 16 03/30/2020   AST 14 (L) 03/30/2020   NA 143 03/31/2020   K 3.5 03/31/2020   CL 104 03/31/2020   CREATININE 1.13 03/31/2020   BUN 16 03/31/2020   CO2 28 03/31/2020   TSH 0.713 03/30/2020   HGBA1C 10.4 (H) 03/29/2020      Assessment / Plan:      1. Multivessel CAD- Cardiac cath report listed above.   2. Hypertension-well controlled. Continue asa, statin, cozaar, and aldactone 3. Diabetes Mellitus-blood glucose under moderate control, continue SSI.  He was on metformin at home.  4. AKI-Creatinine 1.13, electrolytes okay 5. Ischemic Cardiomyopathy-estimated EF 20%-25%. Viability study performed  Plan: Procedure of coronary artery bypass grating explained in detail with the patient and son at the bedside. All questions were answered to the best of my knowledge. They did request someone from cardiology to explain the cardiac MRI in greater detail as well as the PYP scan once performed. Dr. Donata ClayVan Trigt to ultimately decide candidacy for CABG.    I  spent 40 minutes counseling the patient face to face.  Jari Favreessa Conte, PA-C  04/02/2020 12:53 PM  Patient examined, images of coronary arteriogram and echocardiogram personally reviewed and discussed with patient. 79 year old with poorly controlled DM[ A-1-C 10.5]recently diagnosed with ischemic cardiomyopathy with EF 20% and compensated cardiac output and R heart pressures admitted with syncope. No valvular disease  and in NSR  His CAD is diffuse with a diabetic pattern of atretic vessels which are not suitable targets for bypass grafting. Recommend medical therapy of his ischemic CM amd improved DM control.  Kerin PernaPeter Van Trigt MD

## 2020-04-02 NOTE — Progress Notes (Signed)
CVTS consult placed per d/w Darius Bump, RN.
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
## 2020-04-02 NOTE — Evaluation (Signed)
Physical Therapy Evaluation Patient Details Name: Ryan [name] MRN: [id number] DOB: 07-May-1941 Today's Date: 04/02/2020   History of Present Illness  79 y.o. male presenting with Hx of syncope and L bundle heart failure s/p R/L heart cath and coronary angiography. PMHx significant for HTN, poorly controlled DM II, peripheral neuropathy, anxiety, HLD, and LBBB.   Clinical Impression  Pt admitted with above diagnosis. Pt ambulates without device with min guard to supervision assist as he did have occastional LOB which he was able to self correct. Pt states he is not quite at baseline therefore PT will follow acutely.  Pt currently with functional limitations due to the deficits listed below (see PT Problem List). Pt will benefit from skilled PT to increase their independence and safety with mobility to allow discharge to the venue listed below.      Follow Up Recommendations No PT follow up;Supervision - Intermittent    Equipment Recommendations  Cane    Recommendations for Other Services       Precautions / Restrictions Precautions Precautions: Fall Restrictions Weight Bearing Restrictions: No      Mobility  Bed Mobility Overal bed mobility: Independent             General bed mobility comments: Patient seated EOB upon entry.   Transfers Overall transfer level: Needs assistance Equipment used: None Transfers: Sit to/from Stand Sit to Stand: Modified independent (Device/Increase time)         General transfer comment: No assist needed to stand  Ambulation/Gait Ambulation/Gait assistance: Supervision;Min guard Gait Distance (Feet): 500 Feet Assistive device: None Gait Pattern/deviations: Step-through pattern;Decreased stride length;Staggering left;Narrow base of support   Gait velocity interpretation: <1.31 ft/sec, indicative of household ambulator General Gait Details: Pt with deformity left foot which pt states he has compensated well for. Pt did have at  least 2 LOB to his left which he self corrected.  Pt states he doesnt feel quite back to his baseline as he was in bed so many days.    Stairs            Wheelchair Mobility    Modified Rankin (Stroke Patients Only)       Balance Overall balance assessment: Needs assistance Sitting-balance support: Feet supported;No upper extremity supported Sitting balance-Leahy Scale: Good     Standing balance support: No upper extremity supported;During functional activity Standing balance-Leahy Scale: Fair Standing balance comment: No assist needed for standing balance statically and can withstand challenges to balance as well.                              Pertinent Vitals/Pain Pain Assessment: No/denies pain    Home Living Family/patient expects to be discharged to:: Private residence Living Arrangements: Children Available Help at Discharge: Family;Available PRN/intermittently Type of Home: House Home Access: Stairs to enter Entrance Stairs-Rails: Can reach both Entrance Stairs-Number of Steps: 4 Home Layout: Two level;Able to live on main level with bedroom/bathroom Home Equipment: Shower seat      Prior Function Level of Independence: Independent         Comments: Son was responsible for cooking/cleaning. Patient was driving.      Hand Dominance   Dominant Hand: Left    Extremity/Trunk Assessment   Upper Extremity Assessment Upper Extremity Assessment: Defer to OT evaluation    Lower Extremity Assessment Lower Extremity Assessment: Generalized weakness    Cervical / Trunk Assessment Cervical / Trunk Assessment: Normal  Communication  Communication: No difficulties  Cognition Arousal/Alertness: Awake/alert Behavior During Therapy: WFL for tasks assessed/performed Overall Cognitive Status: Impaired/Different from baseline Area of Impairment: Safety/judgement                         Safety/Judgement: Decreased awareness of  safety;Decreased awareness of deficits            General Comments General comments (skin integrity, edema, etc.): Son present at bedside.     Exercises     Assessment/Plan    PT Assessment Patient needs continued PT services  PT Problem List Decreased balance;Decreased mobility;Decreased activity tolerance;Decreased knowledge of precautions;Decreased safety awareness       PT Treatment Interventions Gait training;DME instruction;Balance training    PT Goals (Current goals can be found in the Care Plan section)  Acute Rehab PT Goals Patient Stated Goal: To return home PT Goal Formulation: With patient Time For Goal Achievement: 04/16/20 Potential to Achieve Goals: Good    Frequency Min 2X/week   Barriers to discharge        Co-evaluation               AM-PAC PT "6 Clicks" Mobility  Outcome Measure Help needed turning from your back to your side while in a flat bed without using bedrails?: None Help needed moving from lying on your back to sitting on the side of a flat bed without using bedrails?: None Help needed moving to and from a bed to a chair (including a wheelchair)?: None Help needed standing up from a chair using your arms (e.g., wheelchair or bedside chair)?: None Help needed to walk in hospital room?: A Little Help needed climbing 3-5 steps with a railing? : A Little 6 Click Score: 22    End of Session Equipment Utilized During Treatment: Gait belt Activity Tolerance: Patient tolerated treatment well Patient left: in bed;with call bell/phone within reach (sitting EOB) Nurse Communication: Mobility status PT Visit Diagnosis: Muscle weakness (generalized) (M62.81)    Time: 5329-9242 PT Time Calculation (min) (ACUTE ONLY): 11 min   Charges:   PT Evaluation $PT Eval Low Complexity: 1 Low          Ryan [name],PT Acute Rehabilitation Services Pager:  [phone]  Office:  [phone]    Ryan [name] 04/02/2020, 2:22 PM

## 2020-04-02 NOTE — Progress Notes (Signed)
Occupational Therapy Treatment Patient Details Name: Ryan [name] MRN: [id number] DOB: 05-Aug-1941 Today's Date: 04/02/2020    History of present illness 79 y.o. male presenting with Hx of syncope and L bundle heart failure s/p R/L heart cath and coronary angiography. PMHx significant for HTN, poorly controlled DM II, peripheral neuropathy, anxiety, HLD, and LBBB.    OT comments  Patient making progress toward goals this date with tx. session focusing on higher level balance activities and tub/shower transfers with simulated set-up in hospital room. Patient to step over tub height step with hand held assist and no LOB with cues for pacing. Son present at bedside with questions about safety at home post d/c. OT provided education on shower safety including installation of grab bars and use of shower chair vs. tub transfer bench. Per pt. report he may need to undergo CABG x1 this admission. Patient to remain on OT caseload for reassessment after sx. in prep for safe d/c to next level of care. Current recommendation of no OT follow-up may likely change. OT will continue efforts toward established POC.    Follow Up Recommendations  No OT follow up (Reassess after sx. )    Equipment Recommendations  Other (comment) (Reassess after sx. )    Recommendations for Other Services      Precautions / Restrictions Precautions Precautions: Fall Restrictions Weight Bearing Restrictions: No       Mobility Bed Mobility Overal bed mobility: Independent                Transfers Overall transfer level: Needs assistance Equipment used: None Transfers: Sit to/from Stand           General transfer comment: STS from EOB > recliner x3 trials with Mod I and use of armrests.     Balance     Sitting balance-Leahy Scale: Good       Standing balance-Leahy Scale: Fair                             ADL either performed or assessed with clinical judgement   ADL                                                Vision       Perception     Praxis      Cognition Arousal/Alertness: Awake/alert Behavior During Therapy: WFL for tasks assessed/performed Overall Cognitive Status: Impaired/Different from baseline Area of Impairment: Safety/judgement                         Safety/Judgement: Decreased awareness of safety;Decreased awareness of deficits              Exercises     Shoulder Instructions       General Comments Son present at bedside.     Pertinent Vitals/ Pain       Pain Assessment: No/denies pain  Home Living Family/patient expects to be discharged to:: Private residence Living Arrangements: Children Available Help at Discharge: Family;Available PRN/intermittently Type of Home: House Home Access: Stairs to enter [employer] of Steps: 4 Entrance Stairs-Rails: Can reach both Home Layout: Two level;Able to live on main level with bedroom/bathroom     Bathroom Shower/Tub: Chief Strategy Officer: Standard     Home  Equipment: Shower seat          Prior Functioning/Environment Level of Independence: Independent        Comments: Son was responsible for cooking/cleaning. Patient was driving.    Frequency  Min 2X/week        Progress Toward Goals  OT Goals(current goals can now be found in the care plan section)  Progress towards OT goals: Progressing toward goals  Acute Rehab OT Goals Patient Stated Goal: To return home OT Goal Formulation: With patient Time For Goal Achievement: 04/15/20 Potential to Achieve Goals: Good ADL Goals Pt Will Perform Tub/Shower Transfer: with supervision Additional ADL Goal #1: Patient will recall and demonstrate 3 energy conservation techniques in prep for safe completion of BADLs.  Plan Discharge plan remains appropriate    Co-evaluation                 AM-PAC OT "6 Clicks" Daily Activity     Outcome Measure   Help  from another person eating meals?: None Help from another person taking care of personal grooming?: A Little Help from another person toileting, which includes using toliet, bedpan, or urinal?: A Little Help from another person bathing (including washing, rinsing, drying)?: A Little Help from another person to put on and taking off regular upper body clothing?: None Help from another person to put on and taking off regular lower body clothing?: A Little 6 Click Score: 20    End of Session Equipment Utilized During Treatment: Gait belt  OT Visit Diagnosis: Unsteadiness on feet (R26.81);History of falling (Z91.81)   Activity Tolerance Patient tolerated treatment well   Patient Left in chair;with call bell/phone within reach;with family/visitor present   Nurse Communication          Time: 4580-9983 OT Time Calculation (min): 24 min  Charges: OT General Charges $OT Visit: 1 Visit OT Treatments $Therapeutic Activity: 23-37 mins  Kethan Papadopoulos H. OTR/L Supplemental OT, Department of rehab services [phone]  Saif Peter R H. 04/02/2020, 12:59 PM

## 2020-04-02 NOTE — Progress Notes (Addendum)
PROGRESS NOTE    Ryan [name]  ASU:[id number] DOB: Jan 30, 1941 DOA: 03/29/2020 PCP: Philip Aspen, Limmie Patricia, MD   Brief Narrative: Patient is a 79 year old male with history of poorly controlled diabetes mellitus, hypertension, hyperlipidemia, peripheral neuropathy, BPH, syncope who presented with syncopal episode with prodrome of lightheadedness at a grocery store on 7/11.  In the emergency department, troponin was mildly elevated with no reports of chest pain.  EKG showed first-degree AV block with left bundle branch block with unclear chronicity.  Labs showed elevated lactic acid/AKI with resolved with fluids.  Cardiology was consulted. Echo showed significant cardiomyopathy with ejection fraction of 20 to 25%. Underwent cardiac cath and cardiac MRI .EP and cardiothoracic surgery consulted.  Assessment & Plan:   Principal Problem:   Syncope and collapse Active Problems:   HTN (hypertension)   DM (diabetes mellitus), type 2, uncontrolled w/neurologic complication (HCC)   Neuropathy, peripheral   BPH (benign prostatic hyperplasia)   GAD (generalized anxiety disorder)   Hyperlipidemia   Syncope   Cardiomegaly   Elevated troponin   LBBB (left bundle branch block)   AKI (acute kidney injury) (HCC)   Acute HFrEF (heart failure with reduced ejection fraction) (HCC)   Syncope: Suspected orthostatic hypotension.  Presented with AKI, lactic acidosis.  Found to be dehydrated on presentation.  Given IV fluids.  Orthostatic vital signs are normal now.  Carotid Doppler did not show any significant stenosis.  Echo showed severe left ventricular dysfunction and possible HOCM. EP being consulted for consideration of ICD.  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  Cath showed severe diffuse three-vessel coronary artery disease..  Cardiac MRI showed  both ischemic/nonischemic scar pattern, possible hypertrophic cardiomyopathy, ejection fraction of 33%. Amyloidosis also possible due to finding of asymmetric hypertrophy.NM cardiac test ordered by cardiology.Also ordered UPEP, light chains  Cardiology consulted  cardiothoracic surgery  for consideration of CABG . He denies any chest pain.  Continue aspirin, high-dose statin.  Beta-blockers not started due to bradycardia.    Poorly controlled diabetes mellitus type 2: Hemoglobin A1c of 10.4.  He has irregular eating habits with no limitations on carbohydrate and sugar days for 7 drinks.  He has not been taking any diabetic medications recommended by PCP with exception of Metformin.  Continue sliding scale insulin and lantus .  Diabetic coordinator was following.  Recommended to restart Amaryl on DC with follow-up with PCP.  AKI: Resolved with IV fluids  Hypertension: Currently blood pressure stable.  Home antihypertensives on hold.  Generalized anxiety disorder: Continue home medications  BPH: Continue Flomax  Hyperlipidemia: Continue statin  Aortic root dilation: Aortic root dilated as per  echocardiogram.  Continue to monitor asn an outpatient  Hypokalemia/hypomagnesemia: Continue monitoring and supplementation  History of vitamin B12 deficiency/peripheral neuropathy: Continue  gabapentin         DVT prophylaxis:Lovenox Code Status: Full Family Communication: Son on phone on 04/02/20 Status is: Inpatient  Remains inpatient appropriate because:Unsafe d/c plan   Dispo: The patient is from: Home              Anticipated d/c is to: Home              Anticipated d/c date is:Not exactly known at this point              Patient currently is not medically stable to d/c.  Waiting for cardiothoracic surgery and EP consultation.  Consultants: Cardiology  Procedures:Cardiac cath  Antimicrobials:  Anti-infectives (From admission, onward)   None      Subjective: Patient seen  and examined at the bedside this morning.  Hemodynamically stable.  Comfortable.  Denies any complaints.  Objective: Vitals:   04/01/20 2007 04/01/20 2352 04/02/20 0307 04/02/20 0747  BP: 125/85 131/77 124/74 133/77  Pulse: 69 78 78 70  Resp: Temp: 97.9 F (36.6 C) 98.5 F (36.9 C) 100.3 F (37.9 C) 99.1 F (37.3 C)  TempSrc: Oral  Oral Oral  SpO2: 99% 98% 97% 95%  Weight:   90.3 kg   Height:        Intake/Output Summary (Last 24 hours) at 04/02/2020 0750 Last data filed at 04/02/2020 0015 Gross per 24 hour  Intake 480 ml  Output 250 ml  Net 230 ml   Filed Weights   03/29/20 1501 04/01/20 0029 04/02/20 0307  Weight: 83.5 kg 91.5 kg 90.3 kg    Examination:  General exam: Appears calm and comfortable ,Not in distress, pleasant elderly male  Respiratory system: Bilateral equal air entry, normal vesicular breath sounds, no wheezes or crackles  Cardiovascular system: S1 & S2 heard, RRR. No JVD, murmurs, rubs, gallops or clicks. Gastrointestinal system: Abdomen is nondistended, soft and nontender. No organomegaly or masses felt. Normal bowel sounds heard. Central nervous system: Alert and oriented.  Extremities: No edema, no clubbing ,no cyanosis Skin: No rashes, lesions or ulcers,no icterus ,no pallor   Data Reviewed: I have personally reviewed following labs and imaging studies  CBC: Recent Labs  Lab 03/29/20 1440 03/29/20 1440 03/30/20 0510 03/31/20 0436 03/31/20 1426 03/31/20 1428 03/31/20 1808  WBC 5.7  --  8.4 7.6  --   --  7.0  NEUTROABS 4.0  --  5.1  --   --   --   --   HGB 14.5   < > 12.8* 12.5* 12.2* 12.6* 12.7*  HCT 41.1   < > 36.9* 36.4* 36.0* 37.0* 37.1*  MCV 84.6  --  85.4 84.8  --   --  85.9  PLT 232  --  211 202  --   --  196   < > = values in this interval not displayed.   Basic Metabolic Panel: Recent Labs  Lab 03/29/20 1425 03/29/20 1639 03/30/20 0510 03/31/20 0436 03/31/20 1426 03/31/20 1428 03/31/20 1808  NA 137  --   138 139 143 143  --   K 3.8  --  3.4* 3.6 3.5 3.5  --   CL 103  --  104 104  --   --   --   CO2 20*  --  24 28  --   --   --   GLUCOSE 340*  --  154* 175*  --   --   --   BUN 20  --  17 16  --   --   --   CREATININE 1.25*  --  1.00 1.10  --   --  1.13  CALCIUM 9.2  --  8.6* 8.9  --   --   --   MG  --  1.7 1.6* 2.0  --   --   --   PHOS  --  2.2* 2.8  --   --   --   --    GFR: Estimated Creatinine Clearance: 64.7 mL/min (by C-G formula based on SCr of 1.13 mg/dL). Liver Function Tests: Recent Labs  Lab 03/30/20 0510  AST  14*  ALT 16  ALKPHOS 49  BILITOT 0.8  PROT 5.7*  ALBUMIN 3.6   No results for input(s): LIPASE, AMYLASE in the last 168 hours. No results for input(s): AMMONIA in the last 168 hours. Coagulation Profile: No results for input(s): INR, PROTIME in the last 168 hours. Cardiac Enzymes: Recent Labs  Lab 03/29/20 1440  CKTOTAL 66   BNP (last 3 results) No results for input(s): PROBNP in the last 8760 hours. HbA1C: No results for input(s): HGBA1C in the last 72 hours. CBG: Recent Labs  Lab 04/01/20 1203 04/01/20 1626 04/01/20 2010 04/01/20 2354 04/02/20 0415  GLUCAP 213* 212* 200* 211* 191*   Lipid Profile: Recent Labs    03/31/20 0436  CHOL 131  HDL 27*  LDLCALC 60  TRIG 810*  CHOLHDL 4.9   Thyroid Function Tests: No results for input(s): TSH, T4TOTAL, FREET4, T3FREE, THYROIDAB in the last 72 hours. Anemia Panel: No results for input(s): VITAMINB12, FOLATE, FERRITIN, TIBC, IRON, RETICCTPCT in the last 72 hours. Sepsis Labs: Recent Labs  Lab 03/29/20 1948 03/29/20 2324 03/30/20 0510  LATICACIDVEN 4.1* 2.2* 1.7    Recent Results (from the past 240 hour(s))  SARS Coronavirus 2 by RT PCR (hospital order, performed in [facility] hospital lab) Nasopharyngeal Nasopharyngeal Swab     Status: None   Collection Time: 03/29/20  6:34 PM   Specimen: Nasopharyngeal Swab  Result Value Ref Range Status   SARS Coronavirus 2 NEGATIVE NEGATIVE Final      Comment: (NOTE) SARS-CoV-2 target nucleic acids are NOT DETECTED.  The SARS-CoV-2 RNA is generally detectable in upper and lower respiratory specimens during the acute phase of infection. The lowest concentration of SARS-CoV-2 viral copies this assay can detect is 250 copies / mL. A negative result does not preclude SARS-CoV-2 infection and should not be used as the sole basis for treatment or other patient management decisions.  A negative result may occur with improper specimen collection / handling, submission of specimen other than nasopharyngeal swab, presence of viral mutation(s) within the areas targeted by this assay, and inadequate number of viral copies (<250 copies / mL). A negative result must be combined with clinical observations, patient history, and epidemiological information.  Fact Sheet for Patients:   BoilerBrush.com.cy  Fact Sheet for Healthcare Providers: https://pope.com/  This test is not yet approved or  cleared by the [place] FDA and has been authorized for detection and/or diagnosis of SARS-CoV-2 by FDA under an Emergency Use Authorization (EUA).  This EUA will remain in effect (meaning this test can be used) for the duration of the COVID-19 declaration under Section 564(b)(1) of the Act, 21 U.S.C. section 360bbb-3(b)(1), unless the authorization is terminated or revoked sooner.  Performed at [facility], [address], [place], [place] [address]   MRSA PCR Screening     Status: None   Collection Time: 03/31/20  5:40 AM   Specimen: Nasal Mucosa; Nasopharyngeal  Result Value Ref Range Status   MRSA by PCR NEGATIVE NEGATIVE Final    Comment:        The GeneXpert MRSA Assay (FDA approved for NASAL specimens only), is one component of a comprehensive MRSA colonization surveillance program. It is not intended to diagnose MRSA infection nor to guide or monitor treatment  for MRSA infections. Performed at [facility], [address], [place], [place] [address]          Radiology Studies: CARDIAC CATHETERIZATION  Result Date: 03/31/2020  Ischemic cardiomyopathy  with dilated LV and estimated ejection fraction 25%.  End-diastolic pressure between 18 and 21 mmHg consistent with chronic systolic heart failure.  Severe diffuse three-vessel coronary artery disease.  Left main is widely patent  LAD gives origin to a large diagonal which contains 90% proximal to mid stenosis in the entire mid to distal LAD is diffusely diseased in segments up to 90%.  Severe disease in the circumflex coronary artery with mid to distal high-grade multifocal stenosis in the large first obtuse marginal.  The second obtuse marginal is totally occluded.  The third obtuse marginal as mild to moderate diffuse disease without focal severe obstruction.  Right coronary is dominant.  Distal 50% RCA stenosis.  Diffuse high-grade proximal and distal PDA stenoses greater than 90% and subtotal occlusion of the continuation of the right coronary with small left ventricular branch is noted.  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  If so, would be reasonable to ask the cardiac surgical team whether the patient has adequate targets for grafting (seems unlikely to me).  There are no reasonable PCI options.  Medical therapy for heart failure, strong anti-ischemic regimen, and consideration of resynchronization/ICD.    MR CARDIAC MORPHOLOGY W WO CONTRAST  Result Date: 04/02/2020 CLINICAL DATA:  83M with severe systolic heart failure and severe multivessel CAD. Evaluate myocardial viability EXAM: CARDIAC MRI TECHNIQUE: The patient was scanned on a 1.5 Tesla Siemens magnet. A dedicated cardiac coil was used. Functional  imaging was done using Fiesta sequences. 2,3, and 4 chamber views were done to assess for RWMA's. Modified Simpson's rule using a short axis stack was used to calculate an ejection fraction on a dedicated work Research officer, trade unionstation using Circle software. The patient received 10 cc of Gadavist. After 10 minutes inversion recovery sequences were used to assess for infiltration and scar tissue. CONTRAST:  10 cc  of Gadavist FINDINGS: Left ventricle: -Focal asymmetric hypertrophy measuring up to 17mm in mid inferoseptum (9mm in posterior wall) -Moderate dilatation -Moderate systolic dysfunction. Septal dyskinesis consistent with LBBB -Elevated ECV (35%).  Markedly elevated ECV in basal septum (65%) -Basal to mid dense midwall LGE -Subendocardial LGE (>50% transmural) in basal inferior/lateral walls LV EF: 33% (Normal 56-78%) Absolute volumes: LV EDV: 266mL (Normal 77-195 mL) LV ESV: 178mL (Normal 19-72 mL) LV SV: 88mL (Normal 51-133 mL) CO: 6.0L/min (Normal 2.8-8.8 L/min) Indexed volumes: LV EDV: 17222mL/sq-m (Normal 47-92 mL/sq-m) LV ESV: 6881mL/sq-m (Normal 13-30 mL/sq-m) LV SV: 940mL/sq-m (Normal 32-62 mL/sq-m) CI: 2.8L/min/sq-m (Normal 1.7-4.2 L/min/sq-m) Right ventricle: Normal size and systolic function RV EF:  57% (Normal 47-74%) Absolute volumes: RV EDV: 145mL (Normal 88-227 mL) RV ESV: 63mL (Normal 23-103 mL) RV SV: 82mL (Normal 52-138 mL) CO: 5.6L/min (Normal 2.8-8.8 L/min) Indexed volumes: RV EDV: 3066mL/sq-m (Normal 55-105 mL/sq-m) RV ESV: 6929mL/sq-m (Normal 15-43 mL/sq-m) RV SV: 6338mL/sq-m (Normal 32-64 mL/sq-m) CI: 2.6L/min/sq-m (Normal 1.7-4.2 L/min/sq-m) Left atrium: Mild enlargement Right atrium: Normal size Mitral valve: No regurgitation Aortic valve: Tricuspid. No regurgitation Tricuspid valve: No regurgitation Pulmonic valve: No regurgitation Aorta: Mild dilation of ascending aorta measuring 37mm Pericardium: Normal IMPRESSION: 1. 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 Focal asymmetric  hypertrophy measuring up to 17mm in mid inferoseptum (9mm in posterior wall), which meets criteria for hypertrophic cardiomyopathy. In addition, there is dense midwall late gadolinium enhancement in the basal to mid septum. This is consistent with HCM.  Amyloidosis would also be on differential, as can present with asymmetric hypertrophy and similar LGE pattern. ECV is markedly elevated in the basal septum (65%) but in areas without LGE while the ECV is elevated (35%), it is not in amyloid range (<40%). Suspect HCM as the diagnosis, but would consider checking serum light chains and PYP scan to rule out amyloid 3. Subendocardial LGE consistent with prior infarct in portion of the basal inferior/lateral walls. >50% transmural LGE suggests nonviability in this area. Remainder of myocardium appears viable 4. Moderate LV dilatation with moderate systolic dysfunction (EF 33%) 5.  Normal RV size and systolic function (EF 57%) Electronically Signed   By: Epifanio Lesches MD   On: 04/02/2020 00:13        Scheduled Meds: . aspirin  81 mg Oral Daily  . atorvastatin  80 mg Oral QHS  . docusate sodium  100 mg Oral BID  . enoxaparin (LOVENOX) injection  40 mg Subcutaneous Q24H  . gabapentin  300 mg Oral TID  . insulin aspart  0-9 Units Subcutaneous Q4H  . losartan  50 mg Oral Daily  . PARoxetine  40 mg Oral Daily  . sodium chloride flush  3 mL Intravenous Q12H  . sodium chloride flush  3 mL Intravenous Q12H  . spironolactone  12.5 mg Oral Daily   Continuous Infusions: . sodium chloride       LOS: 2 days    Time spent: 25 mins,More than 50% of that time was spent in counseling and/or coordination of care.      Burnadette Pop, MD [facility] P7/15/2021, 7:50 AM

## 2020-04-02 NOTE — Progress Notes (Signed)
Inpatient Diabetes Program Recommendations  AACE/ADA: New Consensus Statement on Inpatient Glycemic Control (2015)  Target Ranges:  Prepandial:   less than 140 mg/dL      Peak postprandial:   less than 180 mg/dL (1-2 hours)      Critically ill patients:  140 - 180 mg/dL   Lab Results  Component Value Date   GLUCAP 169 (H) 04/02/2020   HGBA1C 10.4 (H) 03/29/2020    Review of Glycemic Control Results for Ryan, [name] (MRN [id number]) as of 04/02/2020 10:43  Ref. Range 04/01/2020 16:26 04/01/2020 20:10 04/01/2020 23:54 04/02/2020 04:15 04/02/2020 07:44  Glucose-Capillary Latest Ref Range: 70 - 99 mg/dL 741 (H) 287 (H) 867 (H) 191 (H) 169 (H)  Diabetes history: DM 2 Outpatient Diabetes medications:Amaryl 2 mg Daily, Metformin 1000 mg bid Current orders for Inpatient glycemic control: Novolog 0-9 units Q4 hours Inpatient Diabetes Program Recommendations:    Please consider changing Novolog correction to tid with meals and HS (since patient is on PO diet).  Also, consider adding Lantus 15 units daily.   Thanks,  Beryl Meager, RN, BC-ADM Inpatient Diabetes Coordinator Pager [phone] (8a-5p)

## 2020-04-02 NOTE — Telephone Encounter (Signed)
Spoke with male and patient was not available. Will call back at another time.

## 2020-04-02 NOTE — Telephone Encounter (Signed)
Pt is calling in stating that he is in the hospital and will not be able to make an appointment.  Pt stated that he may be having open heart surgery.  Pt would like to have a call back so that he can discuss further his situation.

## 2020-04-03 DIAGNOSIS — I421 Obstructive hypertrophic cardiomyopathy: Secondary | ICD-10-CM

## 2020-04-03 LAB — CBC WITH DIFFERENTIAL/PLATELET
Abs Immature Granulocytes: 0.03 10*3/uL (ref 0.00–0.07)
Basophils Absolute: 0.1 10*3/uL (ref 0.0–0.1)
Basophils Relative: 1 %
Eosinophils Absolute: 0.1 10*3/uL (ref 0.0–0.5)
Eosinophils Relative: 1 %
HCT: 37.1 % — ABNORMAL LOW (ref 39.0–52.0)
Hemoglobin: 12.7 g/dL — ABNORMAL LOW (ref 13.0–17.0)
Immature Granulocytes: 0 %
Lymphocytes Relative: 19 %
Lymphs Abs: 1.6 10*3/uL (ref 0.7–4.0)
MCH: 29.8 pg (ref 26.0–34.0)
MCHC: 34.2 g/dL (ref 30.0–36.0)
MCV: 87.1 fL (ref 80.0–100.0)
Monocytes Absolute: 0.9 10*3/uL (ref 0.1–1.0)
Monocytes Relative: 11 %
Neutro Abs: 5.7 10*3/uL (ref 1.7–7.7)
Neutrophils Relative %: 68 %
Platelets: 209 10*3/uL (ref 150–400)
RBC: 4.26 MIL/uL (ref 4.22–5.81)
RDW: 13.6 % (ref 11.5–15.5)
WBC: 8.5 10*3/uL (ref 4.0–10.5)
nRBC: 0 % (ref 0.0–0.2)

## 2020-04-03 LAB — BASIC METABOLIC PANEL
Anion gap: 9 (ref 5–15)
BUN: 15 mg/dL (ref 8–23)
CO2: 24 mmol/L (ref 22–32)
Calcium: 8.7 mg/dL — ABNORMAL LOW (ref 8.9–10.3)
Chloride: 105 mmol/L (ref 98–111)
Creatinine, Ser: 1.12 mg/dL (ref 0.61–1.24)
GFR calc Af Amer: >60 mL/min (ref >60)
GFR calc non Af Amer: >60 mL/min (ref >60)
Glucose, Bld: 194 mg/dL — ABNORMAL HIGH (ref 70–99)
Potassium: 3.8 mmol/L (ref 3.5–5.1)
Sodium: 138 mmol/L (ref 135–145)

## 2020-04-03 LAB — GLUCOSE, CAPILLARY
Glucose-Capillary: 176 mg/dL — ABNORMAL HIGH (ref 70–99)
Glucose-Capillary: 187 mg/dL — ABNORMAL HIGH (ref 70–99)
Glucose-Capillary: 189 mg/dL — ABNORMAL HIGH (ref 70–99)
Glucose-Capillary: 233 mg/dL — ABNORMAL HIGH (ref 70–99)
Glucose-Capillary: 248 mg/dL — ABNORMAL HIGH (ref 70–99)

## 2020-04-03 LAB — PROTEIN ELECTROPHORESIS, SERUM
A/G Ratio: 1.3 (ref 0.7–1.7)
Albumin ELP: 3.2 g/dL (ref 2.9–4.4)
Alpha-1-Globulin: 0.2 g/dL (ref 0.0–0.4)
Alpha-2-Globulin: 0.9 g/dL (ref 0.4–1.0)
Beta Globulin: 0.9 g/dL (ref 0.7–1.3)
Gamma Globulin: 0.5 g/dL (ref 0.4–1.8)
Globulin, Total: 2.5 g/dL (ref 2.2–3.9)
Total Protein ELP: 5.7 g/dL — ABNORMAL LOW (ref 6.0–8.5)

## 2020-04-03 MED ORDER — GLIMEPIRIDE 2 MG PO TABS: 2.0000 mg | ORAL_TABLET | Freq: Two times a day (BID) | ORAL | 1 refills | Status: DC

## 2020-04-03 MED ORDER — METFORMIN HCL 1000 MG PO TABS: 1000.0000 mg | ORAL_TABLET | Freq: Two times a day (BID) | ORAL | 1 refills | Status: DC

## 2020-04-03 MED ORDER — FUROSEMIDE 20 MG PO TABS
20.0000 mg | ORAL_TABLET | Freq: Every day | ORAL | 1 refills | Status: AC
Start: 2020-04-03 — End: 2021-04-03

## 2020-04-03 MED ORDER — SPIRONOLACTONE 25 MG PO TABS: 12.5000 mg | ORAL_TABLET | Freq: Every day | ORAL | 1 refills | Status: DC

## 2020-04-03 MED ORDER — BLOOD GLUCOSE MONITOR KIT: PACK | 0 refills | Status: DC

## 2020-04-03 MED ORDER — POTASSIUM CHLORIDE ER 20 MEQ PO TBCR: 20.0000 meq | EXTENDED_RELEASE_TABLET | Freq: Every day | ORAL | 1 refills | Status: DC

## 2020-04-03 MED ORDER — LOSARTAN POTASSIUM 50 MG PO TABS: 50.0000 mg | ORAL_TABLET | Freq: Every day | ORAL | 1 refills | Status: DC

## 2020-04-03 MED ORDER — ATORVASTATIN CALCIUM 80 MG PO TABS: 80.0000 mg | ORAL_TABLET | Freq: Every day | ORAL | 1 refills | Status: DC

## 2020-04-03 NOTE — Progress Notes (Addendum)
Progress Note  Patient Name: Ryan [name] Date of Encounter: 04/03/2020  [facility] Cardiologist: Armanda Magic, MD   Subjective   NO CP, SOB.    Inpatient Medications    Scheduled Meds:  aspirin  81 mg Oral Daily   atorvastatin  80 mg Oral QHS   docusate sodium  100 mg Oral BID   enoxaparin (LOVENOX) injection  40 mg Subcutaneous Q24H   gabapentin  300 mg Oral TID   insulin aspart  0-5 Units Subcutaneous QHS   insulin aspart  0-9 Units Subcutaneous TID WC   insulin glargine  15 Units Subcutaneous Daily   losartan  50 mg Oral Daily   PARoxetine  40 mg Oral Daily   sodium chloride flush  3 mL Intravenous Q12H   sodium chloride flush  3 mL Intravenous Q12H   spironolactone  12.5 mg Oral Daily   Continuous Infusions:  sodium chloride     PRN Meds: sodium chloride, acetaminophen, HYDROcodone-acetaminophen, ondansetron **OR** ondansetron (ZOFRAN) IV, ondansetron (ZOFRAN) IV, oxyCODONE, sodium chloride flush, zolpidem   Vital Signs    Vitals:   04/02/20 1149 04/02/20 1954 04/03/20 0100 04/03/20 0254  BP: 132/79 138/82  125/73  Pulse: 70 62  69  Resp: 16 17  17   Temp: 98.4 F (36.9 C) 97.9 F (36.6 C)  99.4 F (37.4 C)  TempSrc: Oral Oral  Oral  SpO2: 96% 100%  97%  Weight:   90.5 kg   Height:        Intake/Output Summary (Last 24 hours) at 04/03/2020 0941 Last data filed at 04/03/2020 0256 Gross per 24 hour  Intake 720 ml  Output 825 ml  Net -105 ml   Last 3 Weights 04/03/2020 04/02/2020 04/01/2020  Weight (lbs) 199 lb 8 oz 199 lb 1.6 oz 201 lb 12.8 oz  Weight (kg) 90.493 kg 90.311 kg 91.536 kg      Telemetry    SR 60's, no bradycardia, no high degree AVblock - Personally Reviewed  ECG    No new EKGs - Personally Reviewed  Physical Exam   GEN: No acute distress.   Neck: No JVD Cardiac: RRR, no murmurs, rubs, or gallops.  Respiratory: CTA b/l. GI: Soft, nontender, non-distended  MS: No edema; No deformity. Neuro:  Nonfocal  Psych:  Normal affect   Labs    High Sensitivity Troponin:   Recent Labs  Lab 03/29/20 1440 03/29/20 1639 03/30/20 0510  TROPONINIHS 22* 21* 34*      Chemistry Recent Labs  Lab 03/30/20 0510 03/30/20 0510 03/31/20 0436 03/31/20 0436 03/31/20 1426 03/31/20 1428 03/31/20 1808 04/03/20 0522  NA 138   < > 139   < > 143 143  --  138  K 3.4*   < > 3.6   < > 3.5 3.5  --  3.8  CL 104  --  104  --   --   --   --  105  CO2 24  --  28  --   --   --   --  24  GLUCOSE 154*  --  175*  --   --   --   --  194*  BUN 17  --  16  --   --   --   --  15  CREATININE 1.00   < > 1.10  --   --   --  1.13 1.12  CALCIUM 8.6*  --  8.9  --   --   --   --  8.7*  PROT 5.7*  --   --   --   --   --   --   --   ALBUMIN 3.6  --   --   --   --   --   --   --   AST 14*  --   --   --   --   --   --   --   ALT 16  --   --   --   --   --   --   --   ALKPHOS 49  --   --   --   --   --   --   --   BILITOT 0.8  --   --   --   --   --   --   --   GFRNONAA >60   < > >60  --   --   --  >60 >60  GFRAA >60   < > >60  --   --   --  >60 >60  ANIONGAP 10  --  7  --   --   --   --  9   < > = values in this interval not displayed.     Hematology Recent Labs  Lab 03/31/20 0436 03/31/20 1426 03/31/20 1428 03/31/20 1808 04/03/20 0522  WBC 7.6  --   --  7.0 8.5  RBC 4.29  --   --  4.32 4.26  HGB 12.5*   < > 12.6* 12.7* 12.7*  HCT 36.4*   < > 37.0* 37.1* 37.1*  MCV 84.8  --   --  85.9 87.1  MCH 29.1  --   --  29.4 29.8  MCHC 34.3  --   --  34.2 34.2  RDW 13.2  --   --  13.2 13.6  PLT 202  --   --  196 209   < > = values in this interval not displayed.    BNPNo results for input(s): BNP, PROBNP in the last 168 hours.   DDimer  Recent Labs  Lab 03/29/20 1440  DDIMER 0.75*     Radiology      Cardiac Studies    04/02/2020: PYP scan IMPRESSION: Visual and quantitative assessment (grade 2, H/CLL equal 1.2) are equivocal for transthyretin amyloidosis. Consider repeat exam in the future if continued concern  for transthyretin amyloidosis.   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 Focal asymmetric hypertrophy measuring up to 47mm in mid inferoseptum (71mm in posterior wall), which meets criteria for hypertrophic cardiomyopathy. In addition, there is dense midwall late gadolinium enhancement in the basal to mid septum. This is consistent with HCM. Amyloidosis would also be on differential, as can present with asymmetric hypertrophy and similar LGE pattern. ECV is markedly elevated in the basal septum (65%) but in areas without LGE while the ECV is elevated (35%), it is not in amyloid range (<40%). Suspect HCM as the diagnosis, but would consider checking serum light chains and PYP scan to rule out amyloid   3. Subendocardial LGE consistent with prior infarct in portion of the basal inferior/lateral walls. >50% transmural LGE suggests nonviability in this area. Remainder of myocardium appears viable   4. Moderate LV dilatation with moderate systolic dysfunction (EF 33%)   5.  Normal RV size and systolic function (EF 57%)     03/31/2020 : LHC Ischemic cardiomyopathy with dilated LV and  estimated ejection fraction 25%.  End-diastolic pressure between 18 and 21 mmHg consistent with chronic systolic heart failure. Severe diffuse three-vessel coronary artery disease. Left main is widely patent LAD gives origin to a large diagonal which contains 90% proximal to mid stenosis in the entire mid to distal LAD is diffusely diseased in segments up to 90%. Severe disease in the circumflex coronary artery with mid to distal high-grade multifocal stenosis in the large first obtuse marginal.  The second obtuse marginal is totally occluded.  The third obtuse marginal as mild to moderate diffuse disease without focal severe obstruction. Right coronary is dominant.  Distal 50% RCA stenosis.  Diffuse high-grade proximal and distal PDA stenoses  greater than 90% and subtotal occlusion of the continuation of the right coronary with small left ventricular branch is noted. 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  If so, would be reasonable to ask the cardiac surgical team whether the patient has adequate targets for grafting (seems unlikely to me).  There are no reasonable PCI options. Medical therapy for heart failure, strong anti-ischemic regimen, and consideration of resynchronization/ICD.   Echo 03/30/20: 1. Left ventricular ejection fraction, by estimation, is 20 to 25%. The  left ventricle has severely decreased function. The left ventricle  demonstrates global hypokinesis. The left ventricular internal cavity size  was moderately dilated. There is  moderate left ventricular hypertrophy. Left ventricular diastolic  parameters are consistent with Grade II diastolic dysfunction  (pseudonormalization). Elevated left atrial pressure.   2. Right ventricular systolic function is normal. The right ventricular  size is normal.   3. The mitral valve is normal in structure. Mild mitral valve  regurgitation. No evidence of mitral stenosis.   4. The aortic valve is tricuspid. Aortic valve regurgitation is not  visualized. Mild aortic valve sclerosis is present, with no evidence of  aortic valve stenosis.   5. Aortic dilatation noted. There is mild dilatation of the aortic root  measuring 38 mm  Patient Profile     79 y.o. male with a hx of HTN, HLD, DM, diabetic neuropathy, charcot foot b/l, syncope.  He was admitted with recurrent syncope EP consulted for consideration of loop implant, w/u revealed reduced LVEF > cath with multivessel CAD and no loop planned.  EP is asked to revisit with h/o syncope in the environment of HCM, reduced LVEF, non-revascularizable  CAD  Assessment & Plan    1. 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 Ladona Ridgel (he will see the patient later today)  who had already been in communication with Dr. Mayford Knife regarding the case. Recommend Life vest while the patient is being optimized and re-evaluate in the outpatient setting.   I have discussed this with the patient, he is happy with this plan.  I have advised him no driving and made him aware of [place] law.    For questions or updates, please contact [facility] Please consult [email or web address] for contact info under  Signed, Sheilah PigeonRenee Lynn Ursuy, PA-C  04/03/2020, 9:41 AM    EP Attending  Patient seen and examined. Agree with above. Discussed with Dr. Mayford Knifeurner. He has a difficult combination of problems. I am not sure ICD insertion will affect hi s long term prognosis which is poor. I would suggest a Life Vest and would consider ICD insertion particularly if his symptoms are improved on medial therapy.   Lewayne BuntingGregg Enjoli Tidd, M.D.

## 2020-04-03 NOTE — Progress Notes (Signed)
   Discussed with Dr. Mayford Knife. 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 Additionally he was found to have severe multivessel CAD, however felt to have poor targets and is not a candidate for CABG. Will pursue a LifeVest while optimizing medications to support heart function.   Spoke with Fredirick Maudlin with [employer] who is hopeful to place monitor today. Spoke with Dennis Bast who will assist with patient education.  Beatriz Stallion, PA-C 04/03/20; 11:47 AM

## 2020-04-03 NOTE — Discharge Summary (Addendum)
Physician Discharge Summary  Constantin Hillery LFY:[id number] DOB: Feb 08, 1941 DOA: 03/29/2020  PCP: Isaac Bliss, Rayford Halsted, MD  Admit date: 03/29/2020 Discharge date: 04/03/2020  Admitted From: Home Disposition:  Home  Discharge Condition:Stable CODE STATUS:FULL Diet recommendation: Heart Healthy  Brief/Interim Summary:  Patient is a 79 year old male with history of poorly controlled diabetes mellitus, hypertension, hyperlipidemia, peripheral neuropathy, BPH, syncope who presented with syncopal episode with prodrome of lightheadedness at a grocery store on 7/11.  In the emergency department, troponin was mildly elevated with no reports of chest pain.  EKG showed first-degree AV block with left bundle branch block with unclear chronicity.  Labs showed elevated lactic acid/AKI with resolved with fluids.  Cardiology was consulted. Echo showed significant cardiomyopathy with ejection fraction of 20 to 25%. Underwent cardiac cath and cardiac MRI .  Cardiothoracic surgery consulted for consideration of CABG but he is not a good candidate so medical management recommended.  He is medically stable for discharge to home today.  He will be followed by cardiology as an outpatient.  Following problems were addressed during his hospitalization:  Syncope: Suspected orthostatic hypotension.  Presented with AKI, lactic acidosis.  Found to be dehydrated on presentation.  Given IV fluids.  Orthostatic vital signs are normal now.  Carotid Doppler did not show any significant stenosis.  Echo showed severe left ventricular dysfunction and possible HOCM. 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  Cath showed severe diffuse three-vessel coronary artery disease..  Cardiac MRI showed both  ischemic/nonischemic scar pattern, possible hypertrophic cardiomyopathy, ejection fraction of 33%. Amyloidosis also possible due to finding of asymmetric hypertrophy.PYP scan  ordered by cardiology was equivocal.Also ordered UPEP, light chains ,reports  are pending Cardiology consulted  cardiothoracic surgery  for consideration of CABG but he has been recommended medical management due to diffuse disease pattern. He denies any chest pain.  Continue aspirin, high-dose statin.  Beta-blockers not started due to bradycardia.  Started on losartan, spironolactone.   Poorly controlled diabetes mellitus type 2: Hemoglobin A1c of 10.4.  He has irregular eating habits with no limitations on carbohydrate and sugar days for 7 drinks.  He has not been taking any diabetic medications recommended by PCP with exception of Metformin.   Diabetic coordinator was  following.  DC recommended to restart Amaryl on DC and continue metformin with follow-up with PCP.  He may need to to be started on insulin which can be done as an outpatient by his PCP  AKI: Resolved with IV fluids  Hypertension: Currently blood pressure stable.  Continue current meds  Generalized anxiety disorder: Continue home medications  BPH:  Flomax stopped due to syncope  Hyperlipidemia: Continue statin  Aortic root dilation: Aortic root dilated as per  echocardiogram.  Continue to monitor asn an outpatient  Hypokalemia/hypomagnesemia: Supplemented and corrected  History of vitamin B12 deficiency/peripheral neuropathy: Continue  gabapentin    Discharge Diagnoses:  Principal Problem:   Syncope and collapse Active Problems:   HTN (hypertension)   DM (diabetes mellitus), type 2, uncontrolled w/neurologic complication (HCC)   Neuropathy, peripheral   BPH (benign prostatic hyperplasia)   GAD (generalized anxiety disorder)   Hyperlipidemia   Syncope   Cardiomegaly   Elevated troponin   LBBB (left bundle branch block)   AKI  (acute kidney injury) ([place])  Acute HFrEF (heart failure with reduced ejection fraction) (HCC)   HOCM (hypertrophic obstructive cardiomyopathy) [facility])    Discharge Instructions  Discharge Instructions    Diet - low sodium heart healthy   Complete by: As directed    Discharge instructions   Complete by: As directed    1)Please take prescribed medications as instructed 2)Follow up with your PCP in a week. 3)Monitor your blood sugars at home 4)You will be called by cardiology for the follow-up appointment.Please do a BMP test in a week   Increase activity slowly   Complete by: As directed      Allergies as of 04/03/2020      Reactions   Sulfa Antibiotics    Blood in Urine      Medication List    STOP taking these medications   lisinopril-hydrochlorothiazide 20-25 MG tablet Commonly known as: ZESTORETIC   tamsulosin 0.4 MG Caps capsule Commonly known as: FLOMAX   traMADol 50 MG tablet Commonly known as: ULTRAM   Trulicity 8.24 MP/5.3IR Sopn Generic drug: Dulaglutide     TAKE these medications   atorvastatin 80 MG tablet Commonly known as: LIPITOR Take 1 tablet (80 mg total) by mouth at bedtime. What changed:   medication strength  how much to take   BD SafetyGlide Syringe/Needle 25G X 1" 3 ML Misc Generic drug: SYRINGE-NEEDLE (DISP) 3 ML Use for B12 injections   blood glucose meter kit and supplies Kit Dispense based on patient and insurance preference. Use up to four times daily as directed. (FOR ICD-9 250.00, 250.01). Notes to patient: See directions.   cyanocobalamin 1000 MCG/ML injection Commonly known as: (VITAMIN B-12) inject 1 ml once a month What changed: Another medication with the same name was removed. Continue taking this medication, and follow the directions you see here. Notes to patient: See directions.   furosemide 20 MG tablet Commonly known as: Lasix Take 1 tablet (20 mg total) by mouth daily.   gabapentin 300 MG capsule Commonly  known as: NEURONTIN TAKE ONE CAPSULE BY MOUTH THREE TIMES A DAY Notes to patient: Due 7/16   glimepiride 2 MG tablet Commonly known as: AMARYL Take 1 tablet (2 mg total) by mouth 2 (two) times daily.   losartan 50 MG tablet Commonly known as: COZAAR Take 1 tablet (50 mg total) by mouth daily. Start taking on: April 04, 2020 Notes to patient: See directions.   metFORMIN 1000 MG tablet Commonly known as: GLUCOPHAGE Take 1 tablet (1,000 mg total) by mouth 2 (two) times daily with a meal.   PARoxetine 40 MG tablet Commonly known as: PAXIL Take 1 tablet (40 mg total) by mouth every morning. Notes to patient: Due 7/17   Potassium Chloride ER 20 MEQ Tbcr Take 20 mEq by mouth daily.   spironolactone 25 MG tablet Commonly known as: ALDACTONE Take 0.5 tablets (12.5 mg total) by mouth daily. Start taking on: April 04, 2020 Notes to patient: Due 7/17   zolpidem 5 MG tablet Commonly known as: AMBIEN Take 1 tablet (5 mg total) by mouth at bedtime as needed for sleep. Notes to patient: See directions.            Durable Medical Equipment  (From admission, onward)         Start     Ordered   04/03/20 1028  For home use only DME Cane  Once        04/03/20 1028          Follow-up Information  Tax adviser Follow up.   Why: Meals on Wheels , please call to get this set up Contact information: [facility] Follow up.   Why: cane Contact information: [place] [phone] [phone]        Isaac Bliss, Rayford Halsted, MD. Schedule an appointment as soon as possible for a visit in 1 week(s).   Specialty: Internal Medicine Contact information: [place] [phone]              Allergies  Allergen Reactions  . Sulfa Antibiotics     Blood in Urine    Consultations:  Cardiology, cardiothoracic surgery   Procedures/Studies: CT Head Wo  Contrast  Result Date: 03/29/2020 CLINICAL DATA:  79 year old male with syncope. EXAM: CT HEAD WITHOUT CONTRAST TECHNIQUE: Contiguous axial images were obtained from the base of the skull through the vertex without intravenous contrast. COMPARISON:  11/27/2008 CT FINDINGS: Brain: No evidence of acute infarction, hemorrhage, hydrocephalus, extra-axial collection or mass lesion/mass effect. Atrophy and chronic small-vessel white matter ischemic changes are again noted. Vascular: Carotid and vertebral atherosclerotic calcifications are noted. Skull: Normal. Negative for fracture or focal lesion. Sinuses/Orbits: No acute finding. Other: None. IMPRESSION: 1. No evidence of acute intracranial abnormality. 2. Atrophy and chronic small-vessel white matter ischemic changes. Electronically Signed   By: Margarette Canada M.D.   On: 03/29/2020 17:04   CT Angio Chest PE W and/or Wo Contrast  Result Date: 03/29/2020 CLINICAL DATA:  Diaphoresis, dizziness, loss of consciousness EXAM: CT ANGIOGRAPHY CHEST WITH CONTRAST TECHNIQUE: Multidetector CT imaging of the chest was performed using the standard protocol during bolus administration of intravenous contrast. Multiplanar CT image reconstructions and MIPs were obtained to evaluate the vascular anatomy. CONTRAST:  147m OMNIPAQUE IOHEXOL 350 MG/ML SOLN COMPARISON:  03/29/2020 FINDINGS: Cardiovascular: This is a technically adequate evaluation of the pulmonary vasculature. No filling defects or pulmonary emboli. The heart is enlarged with mild left ventricular dilatation. There is moderate atherosclerosis throughout the coronary vasculature greatest in the LAD distribution. Mediastinum/Nodes: No enlarged mediastinal, hilar, or axillary lymph nodes. Thyroid gland, trachea, and esophagus demonstrate no significant findings. Lungs/Pleura: No acute airspace disease, effusion, or pneumothorax. Central airways are patent. Upper Abdomen: Calcified gallstones are identified without  cholecystitis. Right lobe liver cyst is noted. No acute upper abdominal findings otherwise. Musculoskeletal: No acute or destructive bony lesions. Reconstructed images demonstrate no additional findings. Review of the MIP images confirms the above findings. IMPRESSION: 1. No evidence of pulmonary embolus. 2. Cardiomegaly with left ventricular dilatation. 3. Moderate coronary artery atherosclerosis. 4. Cholelithiasis without cholecystitis. Electronically Signed   By: MRanda NgoM.D.   On: 03/29/2020 17:01   NM CARDIAC AMYLOID TUMOR LOC INFLAM SPECT 1 DAY  Result Date: 04/03/2020 CLINICAL DATA:  HEART FAILURE. CONCERN FOR CARDIAC AMYLOIDOSIS. EXAM: NUCLEAR MEDICINE TUMOR LOCALIZATION. PYP CARDIAC AMYLOIDOSIS SCAN WITH SPECT TECHNIQUE: Following intravenous administration of radiopharmaceutical, anterior planar images of the chest were obtained. Regions of interest were placed on the heart and contralateral chest wall for quantitative assessment. Additional SPECT imaging of the chest was obtained. RADIOPHARMACEUTICALS:  19.5 mCi TECHNETIUM 99 PYROPHOSPHATE FINDINGS: Planar Visual assessment: Anterior planar imaging demonstrates radiotracer uptake within the heart equal to uptake within the adjacent ribs (Grade 2). Quantitative assessment : Quantitative assessment of the cardiac uptake compared to the contralateral chest wall is equal to 1.2 (H/CL = 1.2). SPECT assessment: SPECT imaging of the  chest demonstrates mild radiotracer accumulation within the LEFT ventricle. IMPRESSION: Visual and quantitative assessment (grade 2, H/CLL equal 1.2) are equivocal for transthyretin amyloidosis. Consider repeat exam in the future if continued concern for transthyretin amyloidosis. Electronically Signed   By: Suzy Bouchard M.D.   On: 04/03/2020 08:36   CARDIAC CATHETERIZATION  Result Date: 03/31/2020  Ischemic cardiomyopathy with dilated LV and estimated ejection fraction 25%.  End-diastolic pressure between 18 and  21 mmHg consistent with chronic systolic heart failure.  Severe diffuse three-vessel coronary artery disease.  Left main is widely patent  LAD gives origin to a large diagonal which contains 90% proximal to mid stenosis in the entire mid to distal LAD is diffusely diseased in segments up to 90%.  Severe disease in the circumflex coronary artery with mid to distal high-grade multifocal stenosis in the large first obtuse marginal.  The second obtuse marginal is totally occluded.  The third obtuse marginal as mild to moderate diffuse disease without focal severe obstruction.  Right coronary is dominant.  Distal 50% RCA stenosis.  Diffuse high-grade proximal and distal PDA stenoses greater than 90% and subtotal occlusion of the continuation of the right coronary with small left ventricular branch is noted.  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  If so, would be reasonable to ask the cardiac surgical team whether the patient has adequate targets for grafting (seems unlikely to me).  There are no reasonable PCI options.  Medical therapy for heart failure, strong anti-ischemic regimen, and consideration of resynchronization/ICD.    DG Chest Portable 1 View  Result Date: 03/29/2020 CLINICAL DATA:  Syncopal episode.  Chest pain.  Diabetes. EXAM: PORTABLE CHEST 1 VIEW COMPARISON:  11/27/2008 FINDINGS: The heart size and mediastinal contours are within normal limits. Both lungs are clear. The visualized skeletal structures are unremarkable. IMPRESSION: No active disease. Electronically Signed   By: Marlaine Hind M.D.   On: 03/29/2020 15:03   MR CARDIAC MORPHOLOGY W WO CONTRAST  Result Date: 04/02/2020 CLINICAL DATA:  33M with severe systolic heart failure and severe multivessel CAD. Evaluate myocardial viability EXAM: CARDIAC MRI TECHNIQUE: The  patient was scanned on a 1.5 Tesla Siemens magnet. A dedicated cardiac coil was used. Functional imaging was done using Fiesta sequences. 2,3, and 4 chamber views were done to assess for RWMA's. Modified Simpson's rule using a short axis stack was used to calculate an ejection fraction on a dedicated work Conservation officer, nature. The patient received 10 cc of Gadavist. After 10 minutes inversion recovery sequences were used to assess for infiltration and scar tissue. CONTRAST:  10 cc  of Gadavist FINDINGS: Left ventricle: -Focal asymmetric hypertrophy measuring up to 64m in mid inferoseptum (950min posterior wall) -Moderate dilatation -Moderate systolic dysfunction. Septal dyskinesis consistent with LBBB -Elevated ECV (35%).  Markedly elevated ECV in basal septum (65%) -Basal to mid dense midwall LGE -Subendocardial LGE (>50% transmural) in basal inferior/lateral walls LV EF: 33% (Normal 56-78%) Absolute volumes: LV EDV: 26660mNormal 77-195 mL) LV ESV: 178m3mormal 19-72 mL) LV SV: 88mL31mrmal 51-133 mL) CO: 6.0L/min (Normal 2.8-8.8 L/min) Indexed volumes: LV EDV: 122mL/81m (Normal 47-92 mL/sq-m) LV ESV: 81mL/s46m(Normal 13-30 mL/sq-m) LV SV: 40mL/sq43mNormal 32-62 mL/sq-m) CI: 2.8L/min/sq-m (Normal 1.7-4.2 L/min/sq-m) Right ventricle: Normal size and systolic function RV EF:  57% (Normal 47-74%) Absolute volumes: RV EDV: 145mL (No23m 88-227 mL)  RV ESV: 2m (Normal 23-103 mL) RV SV: 847m(Normal 52-138 mL) CO: 5.6L/min (Normal 2.8-8.8 L/min) Indexed volumes: RV EDV: 6662mq-m (Normal 55-105 mL/sq-m) RV ESV: 40m72m-m (Normal 15-43 mL/sq-m) RV SV: 38mL33mm (Normal 32-64 mL/sq-m) CI: 2.6L/min/sq-m (Normal 1.7-4.2 L/min/sq-m) Left atrium: Mild enlargement Right atrium: Normal size Mitral valve: No regurgitation Aortic valve: Tricuspid. No regurgitation Tricuspid valve: No regurgitation Pulmonic valve: No regurgitation Aorta: Mild dilation of ascending aorta measuring 37mm 57mcardium: Normal IMPRESSION:  1. 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 Focal asymmetric hypertrophy measuring up to 17mm i28md inferoseptum (9mm in 30mterior wall), which meets criteria for hypertrophic cardiomyopathy. In addition, there is dense midwall late gadolinium enhancement in the basal to mid septum. This is consistent with HCM. Amyloidosis would also be on differential, as can present with asymmetric hypertrophy and similar LGE pattern. ECV is markedly elevated in the basal septum (65%) but in areas without LGE while the ECV is elevated (35%), it is not in amyloid range (<40%). Suspect HCM as the diagnosis, but would consider checking serum light chains and PYP scan to rule out amyloid 3. Subendocardial LGE consistent with prior infarct in portion of the basal inferior/lateral walls. >50% transmural LGE suggests nonviability in this area. Remainder of myocardium appears viable 4. Moderate LV dilatation with moderate systolic dysfunction (EF 33%) 5. 66%rmal RV size and systolic function (EF 57%) Ele44%onically Signed   By: ChristopOswaldo Milian: 04/02/2020 00:13   ECHOCARDIOGRAM COMPLETE  Result Date: 03/30/2020    ECHOCARDIOGRAM REPORT   Patient Name:   Carlisle GTOBENNA NEEDS Exam: 03/30/2020 Medical Rec #:  [id number]   Height:       74.0 in Accession #:    [id number]  Weight:       184.0 lb Date of Birth:  [birth year]   BSA:          2.098 m Patient Age:    78 years23          BP:           144/90 mmHg Patient Gender: M                   HR:           62 bpm. Exam Location:  Inpatient Procedure: 2D Echo, Cardiac Doppler and Color Doppler Indications:    Syncope  History:        Patient has no prior history of Echocardiogram examinations.                 Arrythmias:LBBB, Signs/Symptoms:Syncope; Risk                 Factors:Hypertension, Diabetes and Dyslipidemia.  Sonographer:    Brooke SDustin Flockng Phys: [id number] ANAArcolat  ventricular ejection fraction, by estimation, is 20 to 25%. The left ventricle has severely decreased function. The left ventricle demonstrates global hypokinesis. The left ventricular internal cavity size was moderately dilated. There is moderate left ventricular hypertrophy. Left ventricular diastolic parameters are consistent with Grade II diastolic dysfunction (pseudonormalization). Elevated left atrial pressure.  2. Right ventricular systolic function is normal. The right ventricular size is normal.  3. The mitral valve is normal in structure. Mild mitral valve regurgitation. No evidence of mitral stenosis.  4. The aortic valve is  tricuspid. Aortic valve regurgitation is not visualized. Mild aortic valve sclerosis is present, with no evidence of aortic valve stenosis.  5. Aortic dilatation noted. 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 FINDINGS  Left Ventricle: Left ventricular ejection fraction, by estimation, is 20 to 25%. The left ventricle has severely decreased function. The left ventricle demonstrates global hypokinesis. The left ventricular internal cavity size was moderately dilated. There is moderate left ventricular hypertrophy. Left ventricular diastolic parameters are consistent with Grade II diastolic dysfunction (pseudonormalization). Elevated left atrial pressure. Right Ventricle: The right ventricular size is normal. Right ventricular systolic function is normal. Left Atrium: Left atrial size was normal in size. Right Atrium: Right atrial size was normal in size. Pericardium: There is no evidence of pericardial effusion. Mitral Valve: The mitral valve is normal in structure. Normal mobility of the mitral valve leaflets. Mild mitral valve regurgitation. No evidence of mitral valve stenosis. Tricuspid Valve: The tricuspid valve is normal in structure. Tricuspid valve regurgitation is trivial. No evidence of tricuspid stenosis. Aortic Valve: The aortic valve is tricuspid.  Aortic valve regurgitation is not visualized. Mild aortic valve sclerosis is present, with no evidence of aortic valve stenosis. Pulmonic Valve: The pulmonic valve was normal in structure. Pulmonic valve regurgitation is mild. No evidence of pulmonic stenosis. Aorta: Aortic dilatation noted. 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 IAS/Shunts: No atrial level shunt detected by color flow Doppler.  LEFT VENTRICLE PLAX 2D LVIDd:         6.10 cm  Diastology LVIDs:         5.00 cm  LV e' lateral:   7.35 cm/s LV PW:         1.50 cm  LV E/e' lateral: 9.4 LV IVS:        1.50 cm  LV e' medial:    3.68 cm/s LVOT diam:     2.80 cm  LV E/e' medial:  18.8 LV SV:         72 LV SV Index:   34 LVOT Area:     6.16 cm  RIGHT VENTRICLE RV Basal diam:  3.50 cm RV S prime:     10.80 cm/s TAPSE (M-mode): 2.7 cm LEFT ATRIUM             Index       RIGHT ATRIUM           Index LA diam:        4.50 cm 2.15 cm/m  RA Area:     23.80 cm LA Vol (A2C):   52.9 ml 25.22 ml/m RA Volume:   73.70 ml  35.13 ml/m LA Vol (A4C):   44.7 ml 21.31 ml/m LA Biplane Vol: 50.9 ml 24.26 ml/m  AORTIC VALVE LVOT Vmax:   62.00 cm/s LVOT Vmean:  41.200 cm/s LVOT VTI:    0.117 m  AORTA Ao Root diam: 3.80 cm MITRAL VALVE               TRICUSPID VALVE MV Area (PHT): 3.17 cm    TR Peak grad:   9.4 mmHg MV Decel Time: 239 msec    TR Vmax:        153.00 cm/s MV E velocity: 69.00 cm/s MV A velocity: 39.40 cm/s  SHUNTS MV E/A ratio:  1.75        Systemic VTI:  0.12 m  Systemic Diam: 2.80 cm Kirk Ruths MD Electronically signed by Kirk Ruths MD Signature Date/Time: 03/30/2020/4:01:32 PM    Final    VAS US CAROTID  Result Date: 03/31/2020 Carotid Arterial Duplex Study Indications:       Syncope. Risk Factors:      Hypertension, hyperlipidemia, Diabetes. Comparison Study:  No prior studies. Performing Technologist: Darlin Coco  Examination Guidelines: A complete evaluation includes B-mode imaging, spectral  Doppler, color Doppler, and power Doppler as needed of all accessible portions of each vessel. Bilateral testing is considered an integral part of a complete examination. Limited examinations for reoccurring indications may be performed as noted.  Right Carotid Findings: +----------+--------+--------+--------+------------------+------------------+           PSV cm/sEDV cm/sStenosisPlaque DescriptionComments           +----------+--------+--------+--------+------------------+------------------+ CCA Prox  62      9                                 intimal thickening +----------+--------+--------+--------+------------------+------------------+ CCA Distal58      9                                 intimal thickening +----------+--------+--------+--------+------------------+------------------+ ICA Prox  40      10      1-39%                                        +----------+--------+--------+--------+------------------+------------------+ ICA Distal26      9                                                    +----------+--------+--------+--------+------------------+------------------+ ECA       71      9                                                    +----------+--------+--------+--------+------------------+------------------+ +----------+--------+-------+----------------+-------------------+           PSV cm/sEDV cmsDescribe        Arm Pressure (mmHG) +----------+--------+-------+----------------+-------------------+ [id number]             Multiphasic, WNL                    +----------+--------+-------+----------------+-------------------+ +---------+--------+--+--------+-+---------+ VertebralPSV cm/s25EDV cm/s7Antegrade +---------+--------+--+--------+-+---------+  Left Carotid Findings: +----------+--------+--------+--------+------------------+------------------+           PSV cm/sEDV cm/sStenosisPlaque DescriptionComments            +----------+--------+--------+--------+------------------+------------------+ CCA Prox  59      11                                intimal thickening +----------+--------+--------+--------+------------------+------------------+ CCA Distal63      17                                intimal thickening +----------+--------+--------+--------+------------------+------------------+ ICA Prox  42  11      1-39%                                        +----------+--------+--------+--------+------------------+------------------+ ICA Distal61      17                                                   +----------+--------+--------+--------+------------------+------------------+ ECA       77      9                                                    +----------+--------+--------+--------+------------------+------------------+ +----------+--------+--------+----------------+-------------------+           PSV cm/sEDV cm/sDescribe        Arm Pressure (mmHG) +----------+--------+--------+----------------+-------------------+ [id number]              Multiphasic, WNL                    +----------+--------+--------+----------------+-------------------+ +---------+--------+--+--------+--+---------+ VertebralPSV cm/s44EDV cm/s11Antegrade +---------+--------+--+--------+--+---------+   Summary: Right Carotid: Velocities in the right ICA are consistent with a 1-39% stenosis. Left Carotid: Velocities in the left ICA are consistent with a 1-39% stenosis. Vertebrals:  Bilateral vertebral arteries demonstrate antegrade flow. Subclavians: Normal flow hemodynamics were seen in bilateral subclavian              arteries. *See table(s) above for measurements and observations.  Electronically signed by Antony Contras MD on 03/31/2020 at 8:48:53 AM.    Final       Subjective: Patient seen and examined at the bedside this morning.  Hemodynamically stable for discharge.  Discharge planning  discussed in detail with the patient and son  Discharge Exam: Vitals:   04/03/20 0254 04/03/20 1204  BP: 125/73 137/83  Pulse: 69 65  Resp: 17 18  Temp: 99.4 F (37.4 C) 98.6 F (37 C)  SpO2: 97% 95%   Vitals:   04/02/20 1954 04/03/20 0100 04/03/20 0254 04/03/20 1204  BP: 138/82  125/73 137/83  Pulse: 62  69 65  Resp: '17  17 18  ' Temp: 97.9 F (36.6 C)  99.4 F (37.4 C) 98.6 F (37 C)  TempSrc: Oral  Oral Oral  SpO2: 100%  97% 95%  Weight:  90.5 kg    Height:        General: Pt is alert, awake, not in acute distress Cardiovascular: RRR, S1/S2 +, no rubs, no gallops Respiratory: CTA bilaterally, no wheezing, no rhonchi Abdominal: Soft, NT, ND, bowel sounds + Extremities: no edema, no cyanosis    The results of significant diagnostics from this hospitalization (including imaging, microbiology, ancillary and laboratory) are listed below for reference.     Microbiology: Recent Results (from the past 240 hour(s))  SARS Coronavirus 2 by RT PCR (hospital order, performed in [facility] hospital lab) Nasopharyngeal Nasopharyngeal Swab     Status: None   Collection Time: 03/29/20  6:34 PM   Specimen: Nasopharyngeal Swab  Result Value Ref Range Status   SARS Coronavirus 2 NEGATIVE NEGATIVE Final    Comment: (NOTE) SARS-CoV-2 target nucleic acids are NOT DETECTED.  The SARS-CoV-2  RNA is generally detectable in upper and lower respiratory specimens during the acute phase of infection. The lowest concentration of SARS-CoV-2 viral copies this assay can detect is 250 copies / mL. A negative result does not preclude SARS-CoV-2 infection and should not be used as the sole basis for treatment or other patient management decisions.  A negative result may occur with improper specimen collection / handling, submission of specimen other than nasopharyngeal swab, presence of viral mutation(s) within the areas targeted by this assay, and inadequate number of viral copies (<250 copies  / mL). A negative result must be combined with clinical observations, patient history, and epidemiological information.  Fact Sheet for Patients:   StrictlyIdeas.no  Fact Sheet for Healthcare Providers: BankingDealers.co.za  This test is not yet approved or  cleared by the [place] FDA and has been authorized for detection and/or diagnosis of SARS-CoV-2 by FDA under an Emergency Use Authorization (EUA).  This EUA will remain in effect (meaning this test can be used) for the duration of the COVID-19 declaration under Section 564(b)(1) of the Act, 21 U.S.C. section 360bbb-3(b)(1), unless the authorization is terminated or revoked sooner.  Performed at [facility], [place] [address], [place], [place] [address]   MRSA PCR Screening     Status: None   Collection Time: 03/31/20  5:40 AM   Specimen: Nasal Mucosa; Nasopharyngeal  Result Value Ref Range Status   MRSA by PCR NEGATIVE NEGATIVE Final    Comment:        The GeneXpert MRSA Assay (FDA approved for NASAL specimens only), is one component of a comprehensive MRSA colonization surveillance program. It is not intended to diagnose MRSA infection nor to guide or monitor treatment for MRSA infections. Performed at [facility], [place] [address], [place], [place] [address]      Labs: BNP (last 3 results) No results for input(s): BNP in the last 8760 hours. Basic Metabolic Panel: Recent Labs  Lab 03/29/20 1425 03/29/20 1425 03/29/20 1639 03/30/20 0510 03/31/20 0436 03/31/20 1426 03/31/20 1428 03/31/20 1808 04/03/20 0522  NA 137   < >  --  138 139 143 143  --  138  K 3.8   < >  --  3.4* 3.6 3.5 3.5  --  3.8  CL 103  --   --  104 104  --   --   --  105  CO2 20*  --   --  24 28  --   --   --  24  GLUCOSE 340*  --   --  154* 175*  --   --   --  194*  BUN 20  --   --  17 16  --   --   --  15  CREATININE 1.25*  --   --  1.00 1.10  --    --  1.13 1.12  CALCIUM 9.2  --   --  8.6* 8.9  --   --   --  8.7*  MG  --   --  1.7 1.6* 2.0  --   --   --   --   PHOS  --   --  2.2* 2.8  --   --   --   --   --    < > = values in this interval not displayed.   Liver Function Tests: Recent Labs  Lab 03/30/20 0510  AST 14*  ALT 16  ALKPHOS 49  BILITOT 0.8  PROT 5.7*  ALBUMIN 3.6   No results for input(s): LIPASE, AMYLASE in the last 168 hours. No results for input(s): AMMONIA in the last 168 hours. CBC: Recent Labs  Lab 03/29/20 1440 03/29/20 1440 03/30/20 0510 03/30/20 0510 03/31/20 0436 03/31/20 1426 03/31/20 1428 03/31/20 1808 04/03/20 0522  WBC 5.7  --  8.4  --  7.6  --   --  7.0 8.5  NEUTROABS 4.0  --  5.1  --   --   --   --   --  5.7  HGB 14.5   < > 12.8*   < > 12.5* 12.2* 12.6* 12.7* 12.7*  HCT 41.1   < > 36.9*   < > 36.4* 36.0* 37.0* 37.1* 37.1*  MCV 84.6  --  85.4  --  84.8  --   --  85.9 87.1  PLT 232  --  211  --  202  --   --  196 209   < > = values in this interval not displayed.   Cardiac Enzymes: Recent Labs  Lab 03/29/20 1440  CKTOTAL 66   BNP: Invalid input(s): POCBNP CBG: Recent Labs  Lab 04/02/20 1957 04/03/20 0057 04/03/20 0528 04/03/20 0816 04/03/20 1117  GLUCAP 258* 248* 187* 189* 233*   D-Dimer No results for input(s): DDIMER in the last 72 hours. Hgb A1c No results for input(s): HGBA1C in the last 72 hours. Lipid Profile No results for input(s): CHOL, HDL, LDLCALC, TRIG, CHOLHDL, LDLDIRECT in the last 72 hours. Thyroid function studies No results for input(s): TSH, T4TOTAL, T3FREE, THYROIDAB in the last 72 hours.  Invalid input(s): FREET3 Anemia work up No results for input(s): VITAMINB12, FOLATE, FERRITIN, TIBC, IRON, RETICCTPCT in the last 72 hours. Urinalysis    Component Value Date/Time   COLORURINE YELLOW 03/29/2020 1425   APPEARANCEUR CLEAR 03/29/2020 1425   LABSPEC 1.036 (H) 03/29/2020 1425   PHURINE 5.0 03/29/2020 1425   GLUCOSEU >=500 (A) 03/29/2020 1425    HGBUR NEGATIVE 03/29/2020 1425   BILIRUBINUR NEGATIVE 03/29/2020 1425   KETONESUR 5 (A) 03/29/2020 1425   PROTEINUR NEGATIVE 03/29/2020 1425   UROBILINOGEN 0.2 11/27/2008 1045   NITRITE NEGATIVE 03/29/2020 1425   LEUKOCYTESUR NEGATIVE 03/29/2020 1425   Sepsis Labs Invalid input(s): PROCALCITONIN,  WBC,  LACTICIDVEN Microbiology Recent Results (from the past 240 hour(s))  SARS Coronavirus 2 by RT PCR (hospital order, performed in [place] hospital lab) Nasopharyngeal Nasopharyngeal Swab     Status: None   Collection Time: 03/29/20  6:34 PM   Specimen: Nasopharyngeal Swab  Result Value Ref Range Status   SARS Coronavirus 2 NEGATIVE NEGATIVE Final    Comment: (NOTE) SARS-CoV-2 target nucleic acids are NOT DETECTED.  The SARS-CoV-2 RNA is generally detectable in upper and lower respiratory specimens during the acute phase of infection. The lowest concentration of SARS-CoV-2 viral copies this assay can detect is 250 copies / mL. A negative result does not preclude SARS-CoV-2 infection and should not be used as the sole basis for treatment or other patient management decisions.  A negative result may occur with improper specimen collection / handling, submission of specimen other than nasopharyngeal swab, presence of viral mutation(s) within the areas targeted by this assay, and inadequate number of viral copies (<250 copies / mL). A negative result must be combined with clinical observations, patient history, and epidemiological information.  Fact Sheet for Patients:   StrictlyIdeas.no  Fact Sheet for Healthcare Providers: BankingDealers.co.za  This test is not yet approved or  cleared  by the [place] and has been authorized for detection and/or diagnosis of SARS-CoV-2 by FDA under an Emergency Use Authorization (EUA).  This EUA will remain in effect (meaning this test can be used) for the duration of the COVID-19  declaration under Section 564(b)(1) of the Act, 21 U.S.C. section 360bbb-3(b)(1), unless the authorization is terminated or revoked sooner.  Performed at [facility], [place] [address], [place], [place] [address]   MRSA PCR Screening     Status: None   Collection Time: 03/31/20  5:40 AM   Specimen: Nasal Mucosa; Nasopharyngeal  Result Value Ref Range Status   MRSA by PCR NEGATIVE NEGATIVE Final    Comment:        The GeneXpert MRSA Assay (FDA approved for NASAL specimens only), is one component of a comprehensive MRSA colonization surveillance program. It is not intended to diagnose MRSA infection nor to guide or monitor treatment for MRSA infections. Performed at [facility], [place] [address], [place], [place] [address]     Please note: You were cared for by a hospitalist during your hospital stay. Once you are discharged, your primary care physician will handle any further medical issues. Please note that NO REFILLS for any discharge medications will be authorized once you are discharged, as it is imperative that you return to your primary care physician (or establish a relationship with a primary care physician if you do not have one) for your post hospital discharge needs so that they can reassess your need for medications and monitor your lab values.    Time coordinating discharge: 40 minutes  SIGNED:   Shelly Coss, MD  [facility] 04/03/2020, 3:17 PM Pager [phone]  If 7PM-7AM, please contact night-coverage [email or web address] Password [email or web address]

## 2020-04-03 NOTE — Discharge Instructions (Signed)

## 2020-04-03 NOTE — Care Management Important Message (Signed)
Important Message  Patient Details  Name: Ryan [name] MRN: [id number] Date of Birth: [birth year]   Medicare Important Message Given:  Yes     Renie Ora 04/03/2020, 10:41 AM

## 2020-04-03 NOTE — Progress Notes (Signed)
Pt discharge instructions reviewed with pt and family. Pt and family verbalize understanding and state they have no questions.

## 2020-04-03 NOTE — Progress Notes (Signed)
Nutrition Education Note  RD consulted for nutrition education regarding a Heart Healthy Consistent Carbohydrate diet.   Lipid Panel     Component Value Date/Time   CHOL 131 03/31/2020 0436   TRIG 220 (H) 03/31/2020 0436   HDL 27 (L) 03/31/2020 0436   CHOLHDL 4.9 03/31/2020 0436   VLDL 44 (H) 03/31/2020 0436   LDLCALC 60 03/31/2020 0436   Lab Results  Component Value Date   HGBA1C 10.4 (H) 03/29/2020   RD provided "Heart Healthy Consistent Carbohydrate Nutrition Therapy" handout from the Academy of Nutrition and Dietetics. Reviewed patient's dietary recall. Provided examples on ways to decrease sodium and fat intake in diet. Discouraged intake of processed foods and use of salt shaker. Encouraged fresh fruits and vegetables as well as whole grain sources of carbohydrates to maximize fiber intake. Discussed different food groups and their effects on blood sugar, emphasizing carbohydrate-containing foods. Provided list of carbohydrates and recommended serving sizes of common foods. Discussed importance of controlled and consistent carbohydrate intake throughout the day. Provided examples of ways to balance meals/snacks and encouraged intake of high-fiber, whole grain complex carbohydrates. Teach back method used.  Expect good compliance.  Body mass index is 25.61 kg/m. Pt meets criteria for overweight based on current BMI.  Current diet order is Heart Healthy, patient is consuming approximately 100% of meals at this time. Labs and medications reviewed. No further nutrition interventions warranted at this time. RD contact information provided. If additional nutrition issues arise, please re-consult RD.   Eugene Gavia, MS, RD, LDN RD pager number and weekend/on-call pager number located in [place].

## 2020-04-03 NOTE — Telephone Encounter (Addendum)
Pt called in stating that he will be getting out of the hospital today and will be in Tuesday for his appointment so that Dr. Ardyth Harps can go over his medication.  Pt is not able to have the surgery and will be taking medication instead due to his heart being too weak.

## 2020-04-03 NOTE — Progress Notes (Signed)
Progress Note  Patient Name: Ryan [name] Date of Encounter: 04/03/2020  Primary Cardiologist: Armanda Magic, MD   Subjective   No chest pain or SOB.  Disappointed that revascularization is not an option.    Inpatient Medications    Scheduled Meds: . aspirin  81 mg Oral Daily  . atorvastatin  80 mg Oral QHS  . docusate sodium  100 mg Oral BID  . enoxaparin (LOVENOX) injection  40 mg Subcutaneous Q24H  . gabapentin  300 mg Oral TID  . insulin aspart  0-5 Units Subcutaneous QHS  . insulin aspart  0-9 Units Subcutaneous TID WC  . insulin glargine  15 Units Subcutaneous Daily  . losartan  50 mg Oral Daily  . PARoxetine  40 mg Oral Daily  . sodium chloride flush  3 mL Intravenous Q12H  . sodium chloride flush  3 mL Intravenous Q12H  . spironolactone  12.5 mg Oral Daily   Continuous Infusions: . sodium chloride     PRN Meds: sodium chloride, acetaminophen, HYDROcodone-acetaminophen, ondansetron **OR** ondansetron (ZOFRAN) IV, ondansetron (ZOFRAN) IV, oxyCODONE, sodium chloride flush, zolpidem   Vital Signs    Vitals:   04/02/20 1954 04/03/20 0100 04/03/20 0254 04/03/20 1204  BP: 138/82  125/73 137/83  Pulse: 62  69 65  Resp: 17  17 18   Temp: 97.9 F (36.6 C)  99.4 F (37.4 C) 98.6 F (37 C)  TempSrc: Oral  Oral Oral  SpO2: 100%  97% 95%  Weight:  90.5 kg    Height:        Intake/Output Summary (Last 24 hours) at 04/03/2020 1505 Last data filed at 04/03/2020 0256 Gross per 24 hour  Intake 480 ml  Output 825 ml  Net -345 ml   Filed Weights   04/01/20 0029 04/02/20 0307 04/03/20 0100  Weight: 91.5 kg 90.3 kg 90.5 kg    Telemetry    NSR- Personally Reviewed  ECG    No new EKG to review - Personally Reviewed  Physical Exam   GEN: Well nourished, well developed in no acute distress HEENT: Normal NECK: No JVD; No carotid bruits LYMPHATICS: No lymphadenopathy CARDIAC:RRR, no murmurs, rubs, gallops RESPIRATORY:  Clear to auscultation without  rales, wheezing or rhonchi  ABDOMEN: Soft, non-tender, non-distended MUSCULOSKELETAL:  No edema; No deformity  SKIN: Warm and dry NEUROLOGIC:  Alert and oriented x 3 PSYCHIATRIC:  Normal affect    Labs    Chemistry Recent Labs  Lab 03/30/20 0510 03/30/20 0510 03/31/20 0436 03/31/20 0436 03/31/20 1426 03/31/20 1428 03/31/20 1808 04/03/20 0522  NA 138   < > 139   < > 143 143  --  138  K 3.4*   < > 3.6   < > 3.5 3.5  --  3.8  CL 104  --  104  --   --   --   --  105  CO2 24  --  28  --   --   --   --  24  GLUCOSE 154*  --  175*  --   --   --   --  194*  BUN 17  --  16  --   --   --   --  15  CREATININE 1.00   < > 1.10  --   --   --  1.13 1.12  CALCIUM 8.6*  --  8.9  --   --   --   --  8.7*  PROT 5.7*  --   --   --   --   --   --   --  ALBUMIN 3.6  --   --   --   --   --   --   --   AST 14*  --   --   --   --   --   --   --   ALT 16  --   --   --   --   --   --   --   ALKPHOS 49  --   --   --   --   --   --   --   BILITOT 0.8  --   --   --   --   --   --   --   GFRNONAA >60   < > >60  --   --   --  >60 >60  GFRAA >60   < > >60  --   --   --  >60 >60  ANIONGAP 10  --  7  --   --   --   --  9   < > = values in this interval not displayed.     Hematology Recent Labs  Lab 03/31/20 0436 03/31/20 1426 03/31/20 1428 03/31/20 1808 04/03/20 0522  WBC 7.6  --   --  7.0 8.5  RBC 4.29  --   --  4.32 4.26  HGB 12.5*   < > 12.6* 12.7* 12.7*  HCT 36.4*   < > 37.0* 37.1* 37.1*  MCV 84.8  --   --  85.9 87.1  MCH 29.1  --   --  29.4 29.8  MCHC 34.3  --   --  34.2 34.2  RDW 13.2  --   --  13.2 13.6  PLT 202  --   --  196 209   < > = values in this interval not displayed.    Cardiac EnzymesNo results for input(s): TROPONINI in the last 168 hours. No results for input(s): TROPIPOC in the last 168 hours.   BNPNo results for input(s): BNP, PROBNP in the last 168 hours.   DDimer  Recent Labs  Lab 03/29/20 1440  DDIMER 0.75*     Radiology    NM CARDIAC AMYLOID TUMOR LOC  INFLAM SPECT 1 DAY  Result Date: 04/03/2020 CLINICAL DATA:  HEART FAILURE. CONCERN FOR CARDIAC AMYLOIDOSIS. EXAM: NUCLEAR MEDICINE TUMOR LOCALIZATION. PYP CARDIAC AMYLOIDOSIS SCAN WITH SPECT TECHNIQUE: Following intravenous administration of radiopharmaceutical, anterior planar images of the chest were obtained. Regions of interest were placed on the heart and contralateral chest wall for quantitative assessment. Additional SPECT imaging of the chest was obtained. RADIOPHARMACEUTICALS:  19.5 mCi TECHNETIUM 99 PYROPHOSPHATE FINDINGS: Planar Visual assessment: Anterior planar imaging demonstrates radiotracer uptake within the heart equal to uptake within the adjacent ribs (Grade 2). Quantitative assessment : Quantitative assessment of the cardiac uptake compared to the contralateral chest wall is equal to 1.2 (H/CL = 1.2). SPECT assessment: SPECT imaging of the chest demonstrates mild radiotracer accumulation within the LEFT ventricle. IMPRESSION: Visual and quantitative assessment (grade 2, H/CLL equal 1.2) are equivocal for transthyretin amyloidosis. Consider repeat exam in the future if continued concern for transthyretin amyloidosis. Electronically Signed   By: Genevive BiStewart  Edmunds M.D.   On: 04/03/2020 08:36    Cardiac Studies   2D echo 03/30/2020 IMPRESSIONS   1. Left ventricular ejection fraction, by estimation, is 20 to 25%. The  left ventricle has severely decreased function. The left ventricle  demonstrates global hypokinesis. The left ventricular internal cavity size  was moderately dilated.  There is  moderate left ventricular hypertrophy. Left ventricular diastolic  parameters are consistent with Grade II diastolic dysfunction  (pseudonormalization). Elevated left atrial pressure.  2. Right ventricular systolic function is normal. The right ventricular  size is normal.  3. The mitral valve is normal in structure. Mild mitral valve  regurgitation. No evidence of mitral stenosis.  4. The  aortic valve is tricuspid. Aortic valve regurgitation is not  visualized. Mild aortic valve sclerosis is present, with no evidence of  aortic valve stenosis.  5. Aortic dilatation noted. There is mild dilatation of the aortic root  measuring 38 mm.   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  End-diastolic pressure between 18 and 21 mmHg consistent with chronic systolic heart failure.  Severe diffuse three-vessel coronary artery disease.  Left main is widely patent  LAD gives origin to a large diagonal which contains 90% proximal to mid stenosis in the entire mid to distal LAD is diffusely diseased in segments up to 90%.  Severe disease in the circumflex coronary artery with mid to distal high-grade multifocal stenosis in the large first obtuse marginal.  The second obtuse marginal is totally occluded.  The third obtuse marginal as mild to moderate diffuse disease without focal severe obstruction.  Right coronary is dominant.  Distal 50% RCA stenosis.  Diffuse high-grade proximal and distal PDA stenoses greater than 90% and subtotal occlusion of the continuation of the right coronary with small left ventricular branch is noted.  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  If so, would be reasonable to ask the cardiac surgical team whether the patient has adequate targets for grafting (seems unlikely to me).  There are no reasonable PCI options.  Medical therapy for heart failure, strong anti-ischemic regimen, and consideration of resynchronization/ICD.  Cardiac MRI 04/01/2020 IMPRESSION: 1. 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 Focal asymmetric hypertrophy measuring up to 30mm in mid inferoseptum (28mm in  posterior wall), which meets criteria for hypertrophic cardiomyopathy. In addition, there is dense midwall late gadolinium enhancement in the basal to mid septum. This is consistent with HCM. Amyloidosis would also be on differential, as can present with asymmetric hypertrophy and similar LGE pattern. ECV is markedly elevated in the basal septum (65%) but in areas without LGE while the ECV is elevated (35%), it is not in amyloid range (<40%). Suspect HCM as the diagnosis, but would consider checking serum light chains and PYP scan to rule out amyloid  3. Subendocardial LGE consistent with prior infarct in portion of the basal inferior/lateral walls. >50% transmural LGE suggests nonviability in this area. Remainder of myocardium appears viable  4. Moderate LV dilatation with moderate systolic dysfunction (EF 33%)  5.  Normal RV size and systolic function (EF 57%)   Patient Profile     79 y.o. 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  Assessment & Plan    1.  Syncope -? etiology>>likely orthostatic in origin as he has had other syncopal episodes in the past in the heat and he was in a hot car and then walking in grocery store -he does have significant CAD with severe LV dysfunction so arrhythmia is  possibility as well -cardiac MRI with findings c/w HOCM but also concern for amyloid.  There is late dense midwall LGE in the basal to mid septum with mid inferoseptum 37mm in thickness.   -PYP scan equivocal for Amyloid -SPEP and UPEP are pending >>will followup with these outpt -given HOCM dx and syncope as well as LV dysfunction>>will ask EP to see again for possible ICD.  He would be a candidate for CRT-D given LBBB and ischemic DCM  2.  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 atorva 80mg  daily, Imdur 15mg  daily -no BB due to bradycardia  3.  Mixed NICM/Ischemic DCM/New systolic CHF -EF by echo -LVEDP elevated at cath at 18-59mmHg -? I&O's not complete -weight down 2 lbs from admit -Creatinine stable -will add lasix 20mg  daily PO and Kdur daily and check BMET in 1 week -no BB due to bradycardia -continue Losartan 50mg  daily and change to Entresto 24-26mg  BID as outpt -continue 59-56% 12.5mg  daily -EP recommends LifeVest and then will followup in clinic outpt to determine if candidate for ICD  4.  HLD -LDL 60 but with significant CAD would like to see < 50 -increased atorva to 80mg  daily -needs FLP and ALT in 6 weeks  I have spent a total of 35 minutes with patient reviewing cardiac cath, 2D echo , telemetry, EKGs, labs and examining patient as well as establishing an assessment and plan that was discussed with the patient.  > 50% of time was spent in direct patient care.    For questions or updates, please contact [facility] Please consult [email or web address] for contact info under Cardiology/STEMI.      Signed, 30m, MD  04/03/2020, 3:05 PM

## 2020-04-03 NOTE — TOC Transition Note (Signed)
Transition of Care [facility]) - CM/SW Discharge Note   Patient Details  Name: Ryan [name] MRN: [id number] Date of Birth: 03-Feb-1941  Transition of Care [facility] CM/SW Contact:  Leone Haven, RN Phone Number: 04/03/2020, 11:29 AM   Clinical Narrative:    Patient is for dc today, needs a cane, NCM made referral to [facility] with [facility], the cane will be brought up to room prior to dc.   Final next level of care: Home/Self Care Barriers to Discharge: No Barriers Identified   Patient Goals and CMS Choice Patient states their goals for this hospitalization and ongoing recovery are:: get better   Choice offered to / list presented to : NA  Discharge Placement                       Discharge Plan and Services                DME Arranged: Gilmer Mor DME Agency: [employer] Date DME Agency Contacted: 04/03/20 Time DME Agency Contacted: [id number] Representative spoke with at DME Agency: Ian Malkin            Social Determinants of Health (SDOH) Interventions     Readmission Risk Interventions No flowsheet data found.

## 2020-04-03 NOTE — Telephone Encounter (Signed)
noted 

## 2020-04-07 ENCOUNTER — Telehealth: Payer: Self-pay | Admitting: *Deleted

## 2020-04-07 ENCOUNTER — Other Ambulatory Visit: Payer: Self-pay

## 2020-04-07 ENCOUNTER — Encounter: Payer: Self-pay | Admitting: Internal Medicine

## 2020-04-07 ENCOUNTER — Ambulatory Visit (INDEPENDENT_AMBULATORY_CARE_PROVIDER_SITE_OTHER): Payer: Medicare Other | Admitting: Internal Medicine

## 2020-04-07 VITALS — BP 110/70 | HR 73 | Temp 97.5°F | Wt 198.4 lb

## 2020-04-07 DIAGNOSIS — E538 Deficiency of other specified B group vitamins: Secondary | ICD-10-CM

## 2020-04-07 DIAGNOSIS — G600 Hereditary motor and sensory neuropathy: Secondary | ICD-10-CM

## 2020-04-07 DIAGNOSIS — IMO0002 Reserved for concepts with insufficient information to code with codable children: Secondary | ICD-10-CM

## 2020-04-07 DIAGNOSIS — I5021 Acute systolic (congestive) heart failure: Secondary | ICD-10-CM | POA: Diagnosis not present

## 2020-04-07 DIAGNOSIS — E1149 Type 2 diabetes mellitus with other diabetic neurological complication: Secondary | ICD-10-CM

## 2020-04-07 DIAGNOSIS — E559 Vitamin D deficiency, unspecified: Secondary | ICD-10-CM | POA: Diagnosis not present

## 2020-04-07 DIAGNOSIS — R55 Syncope and collapse: Secondary | ICD-10-CM

## 2020-04-07 DIAGNOSIS — I10 Essential (primary) hypertension: Secondary | ICD-10-CM

## 2020-04-07 DIAGNOSIS — E1165 Type 2 diabetes mellitus with hyperglycemia: Secondary | ICD-10-CM | POA: Diagnosis not present

## 2020-04-07 DIAGNOSIS — Z09 Encounter for follow-up examination after completed treatment for conditions other than malignant neoplasm: Secondary | ICD-10-CM

## 2020-04-07 LAB — UPEP/UIFE/LIGHT CHAINS/TP, 24-HR UR
% BETA, Urine: 19.6 %
ALPHA 1 URINE: 7.4 %
Albumin, U: 46.3 %
Alpha 2, Urine: 16.3 %
Free Kappa Lt Chains,Ur: 44.27 mg/L (ref 0.63–113.79)
Free Kappa/Lambda Ratio: 7.15 (ref 1.03–31.76)
Free Lambda Lt Chains,Ur: 6.19 mg/L (ref 0.47–11.77)
GAMMA GLOBULIN URINE: 10.4 %
Total Protein, Urine-Ur/day: 513 mg/24 hr — ABNORMAL HIGH (ref 30–150)
Total Protein, Urine: 34.2 mg/dL
Total Volume: 1500

## 2020-04-07 MED ORDER — CYANOCOBALAMIN 1000 MCG/ML IJ SOLN
1000.0000 ug | Freq: Once | INTRAMUSCULAR | Status: AC
Start: 2020-04-07 — End: 2020-04-07
  Administered 2020-04-07: 1000 ug via INTRAMUSCULAR

## 2020-04-07 NOTE — Progress Notes (Signed)
Hospital follow-up visit     CC/Reason for Visit: Hospitalization follow-up  HPI: Ryan [name] is a 79 y.o. male who is coming in today for the above mentioned reasons, specifically transitional care services face-to-face visit.    Dates hospitalized: 03/29/2020-04/03/2020 Days since discharge from hospital: 4 days Patient was discharged from the hospital to: Home Reason for admission to hospital: Syncopal event Date of interactive phone contact with patient and/or caregiver: 04/07/2020 due to weekend  I have reviewed in detail patient's discharge summary plus pertinent specific notes, labs, and images from the hospitalization.  Yes  On July 11 he was shopping with his son at a local grocery store and had a syncopal episode with a prodrome of lightheadedness and dizziness.  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  He was also found to have elevated lactic acid and acute renal failure.  Cardiology was consulted.  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  He underwent cardiac MRI and cardiac catheterization.  Cardiothoracic surgery was consulted for consideration of CABG but per their recommendation he was not a good candidate and so medical management was recommended.  He was given a LifeVest on discharge.  He has follow-up scheduled with cardiology on 7/27.  The cause of his syncope was suspected to be orthostatic hypotension given orthostatic changes, lactic acidosis and acute renal failure all of which resolved with IV fluids.  Because of this his Flomax was discontinued.  He was started on ARB, high intensity statin, Lasix, spironolactone, aspirin.  No beta-blocker due to bradycardia.  He was found to have markedly uncontrolled diabetes with an A1c of 10.4.  Amaryl was added to Metformin.  He has started healthy lifestyle  changes in regards to diet and exercise, he has been more compliant with medications since his hospital discharge.  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  Medication reconciliation was done today and patient is taking meds as recommended by discharging hospitalist/specialist.  Yes   Past Medical/Surgical History: Past Medical History:  Diagnosis Date  . B12 deficiency   . BPH (benign prostatic hyperplasia)   . DM (diabetes mellitus), type 2, uncontrolled w/neurologic complication ([place])   . GAD (generalized anxiety disorder)   . HTN (hypertension)   . Hyperlipidemia   . Neuropathy, peripheral     Past Surgical History:  Procedure Laterality Date  . RIGHT/LEFT HEART CATH AND CORONARY ANGIOGRAPHY N/A 03/31/2020   Procedure: RIGHT/LEFT HEART CATH AND CORONARY ANGIOGRAPHY;  Surgeon: Belva Crome, MD;  Location: [place] [facility];  Service: Cardiovascular;  Laterality: N/A;    Social History:  reports that he has never smoked. He has never used smokeless tobacco. He reports that he does not drink alcohol and does not use drugs.  Allergies: Allergies  Allergen Reactions  . Sulfa Antibiotics     Blood in Urine    Family History:  Family History  Problem Relation Age of Onset  . Diabetes Mother      Current Outpatient Medications:  .  aspirin EC 81 MG tablet, Take 81 mg by mouth daily. Swallow whole., Disp: , Rfl:  .  atorvastatin (LIPITOR) 80 MG tablet, Take 1 tablet (80 mg total) by mouth at bedtime., Disp: 30  tablet, Rfl: 1 .  blood glucose meter kit and supplies KIT, Dispense based on patient and insurance preference. Use up to four times daily as directed. (FOR ICD-9 250.00, 250.01)., Disp: 1 each, Rfl: 0 .  cyanocobalamin (,VITAMIN B-12,) 1000 MCG/ML injection, inject 1 ml once a month, Disp: 6 mL, Rfl: 3 .  furosemide (LASIX) 20 MG tablet, Take 1 tablet (20  mg total) by mouth daily., Disp: 30 tablet, Rfl: 1 .  gabapentin (NEURONTIN) 300 MG capsule, TAKE ONE CAPSULE BY MOUTH THREE TIMES A DAY, Disp: 270 capsule, Rfl: 2 .  glimepiride (AMARYL) 2 MG tablet, Take 1 tablet (2 mg total) by mouth 2 (two) times daily., Disp: 60 tablet, Rfl: 1 .  losartan (COZAAR) 50 MG tablet, Take 1 tablet (50 mg total) by mouth daily., Disp: 30 tablet, Rfl: 1 .  metFORMIN (GLUCOPHAGE) 1000 MG tablet, Take 1 tablet (1,000 mg total) by mouth 2 (two) times daily with a meal., Disp: 60 tablet, Rfl: 1 .  PARoxetine (PAXIL) 40 MG tablet, Take 1 tablet (40 mg total) by mouth every morning., Disp: 90 tablet, Rfl: 1 .  potassium chloride 20 MEQ TBCR, Take 20 mEq by mouth daily., Disp: 30 tablet, Rfl: 1 .  spironolactone (ALDACTONE) 25 MG tablet, Take 0.5 tablets (12.5 mg total) by mouth daily., Disp: 30 tablet, Rfl: 1 .  SYRINGE-NEEDLE, DISP, 3 ML (BD SAFETYGLIDE SYRINGE/NEEDLE) 25G X 1" 3 ML MISC, Use for B12 injections, Disp: 100 each, Rfl: 11 .  zolpidem (AMBIEN) 5 MG tablet, Take 1 tablet (5 mg total) by mouth at bedtime as needed for sleep. (Patient not taking: Reported on 03/29/2020), Disp: 30 tablet, Rfl: 1  Review of Systems:  Constitutional: Denies fever, chills, diaphoresis, appetite change and fatigue.  HEENT: Denies photophobia, eye pain, redness, hearing loss, ear pain, congestion, sore throat, rhinorrhea, sneezing, mouth sores, trouble swallowing, neck pain, neck stiffness and tinnitus.   Respiratory: Denies SOB, DOE, cough, chest tightness,  and wheezing.   Cardiovascular: Denies chest pain, palpitations and leg swelling.  Gastrointestinal: Denies nausea, vomiting, abdominal pain, diarrhea, constipation, blood in stool and abdominal distention.  Genitourinary: Denies dysuria, urgency, frequency, hematuria, flank pain and difficulty urinating.  Endocrine: Denies: hot or cold intolerance, sweats, changes in hair or nails, polyuria, polydipsia. Musculoskeletal: Denies  myalgias, back pain, joint swelling, arthralgias and gait problem.  Skin: Denies pallor, rash and wound.  Neurological: Denies dizziness, seizures, syncope, weakness, light-headedness, numbness and headaches.  Hematological: Denies adenopathy. Easy bruising, personal or family bleeding history  Psychiatric/Behavioral: Denies suicidal ideation, mood changes, confusion, nervousness, sleep disturbance and agitation    Physical Exam: Vitals:   04/07/20 0934  BP: 110/70  Pulse: 73  Temp: (!) 97.5 F (36.4 C)  TempSrc: Temporal  SpO2: 97%  Weight: 198 lb 6.4 oz (90 kg)    Body mass index is 25.47 kg/m.   Constitutional: NAD, calm, comfortable Eyes: PERRL, lids and conjunctivae normal ENMT: Mucous membranes are moist.  Respiratory: clear to auscultation bilaterally, no wheezing, no crackles. Normal respiratory effort. No accessory muscle use.  Cardiovascular: Regular rate and rhythm, no murmurs / rubs / gallops. No extremity edema. Neurologic: Grossly intact and nonfocal Psychiatric: Normal judgment and insight. Alert and oriented x 3. Normal mood.    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 -I agree with all medications he was  discharged with, he is taking them as prescribed, he has started using a pillbox with his son's assistance. -He has follow-up with cardiology scheduled on 7/27.  I would imagine that there has been consideration given to an AICD, he is currently wearing his LifeVest.  Vitamin D deficiency -Recheck levels next visit  DM (diabetes mellitus), type 2, uncontrolled w/neurologic complication ([place]) -Noted to have an A1c of 10.4 in the hospital. -He was just started on Amaryl 2 mg twice daily in addition to Metformin during his hospitalization. -He is making positive lifestyle  changes. -I will allow 3 months.  If A1c still not at goal may need to consider additional therapy.  B12 deficiency -For IM B12 today.  Essential hypertension -Well-controlled.  Charcot-Marie-Tooth disease-like deformity of foot -Podiatry referral. -No need for antibiotics at this time.   Medical decision making of high complexity was utilized today.  Patient Instructions  -Nice seeing you today!!  -See you back in 3 months.  -Podiatry referral.      Lelon Frohlich, MD [place] [facility]

## 2020-04-07 NOTE — Telephone Encounter (Signed)
Transition Care Management Follow-up Telephone Call   Date discharged?04/03/20   How have you been since you were released from the hospital? good   Do you understand why you were in the hospital? yes   Do you understand the discharge instructions? yes   Where were you discharged to? home   Items Reviewed:  Medications reviewed: yes  Allergies reviewed: yes  Dietary changes reviewed: yes  Referrals reviewed: yes   Functional Questionnaire:   Activities of Daily Living (ADLs):   He states they are independent in the following: ambulation, bathing and hygiene, feeding, continence, grooming, toileting and dressing States they require assistance with the following: none   Any transportation issues/concerns?: no   Any patient concerns? no   Confirmed importance and date/time of follow-up visits scheduled yes  Provider Appointment booked with DR Ardyth Harps 04/07/20  Confirmed with patient if condition begins to worsen call PCP or go to the ER.  Patient was given the office number and encouraged to call back with question or concerns.  : yes

## 2020-04-07 NOTE — Patient Instructions (Signed)
-  Nice seeing you today!!  -See you back in 3 months.  -Podiatry referral.

## 2020-04-13 NOTE — Progress Notes (Signed)
Cardiology Office Note    Date:  04/14/2020   ID:  Ryan [name], Ryan [name] [birth year], MRN [id number]  PCP:  Ryan [name], Ryan Halsted, MD  Cardiologist: Ryan Him, MD EPS: None  Chief Complaint  Patient presents with  . Hospitalization Follow-up    History of Present Illness:  Ryan [name] is a 79 y.o. male with history of hypertension, HLD, DM, anxiety.  Patient was admitted with syncope that was likely orthostatic in origin as it was in a hot car and walking into the grocery store.  He was found to have severe LV dysfunction so arrhythmia is a possibility as well.  EF 20 to 25% by echo, cardiac cath with three-vessel CAD no targets for PCI and turned down for CABG, cardiac MRI shows HOCM but also concerns for amyloid with a late dense mid wall LGE in the basal to mid septum and mid inferior septum 17 mm in thickness.  PYP scan was equivocal for amyloid.  SPEP and UPEP were negative.  Given HOCM diagnosis and syncope as well as LV dysfunction needs to see EP for possible ICD.  Would be a candidate for CRT-D given LBBB and ischemic DCM.  With not placed on beta-blocker due to bradycardia  Patient comes in for f/u. Overall feels good. Gets a little dizzy when he stands up. Not walking yet with the heat. Denies chest pain, shortness of breath, edema, palpitations. Wearing a life vest and hasn't gone off.  Past Medical History:  Diagnosis Date  . B12 deficiency   . BPH (benign prostatic hyperplasia)   . DM (diabetes mellitus), type 2, uncontrolled w/neurologic complication ([place])   . GAD (generalized anxiety disorder)   . HTN (hypertension)   . Hyperlipidemia   . Neuropathy, peripheral     Past Surgical History:  Procedure Laterality Date  . RIGHT/LEFT HEART CATH AND CORONARY ANGIOGRAPHY N/A 03/31/2020   Procedure: RIGHT/LEFT HEART CATH AND CORONARY ANGIOGRAPHY;  Surgeon: Ryan Crome, MD;  Location: [facility];  Service: Cardiovascular;  Laterality: N/A;     Current Medications: Current Meds  Medication Sig  . aspirin EC 81 MG tablet Take 81 mg by mouth daily. Swallow whole.  Marland Kitchen atorvastatin (LIPITOR) 80 MG tablet Take 1 tablet (80 mg total) by mouth at bedtime.  . blood glucose meter kit and supplies KIT Dispense based on patient and insurance preference. Use up to four times daily as directed. (FOR ICD-9 250.00, 250.01).  . cyanocobalamin (,VITAMIN B-12,) 1000 MCG/ML injection inject 1 ml once a month  . furosemide (LASIX) 20 MG tablet Take 1 tablet (20 mg total) by mouth daily.  Marland Kitchen gabapentin (NEURONTIN) 300 MG capsule TAKE ONE CAPSULE BY MOUTH THREE TIMES A DAY  . glimepiride (AMARYL) 2 MG tablet Take 1 tablet (2 mg total) by mouth 2 (two) times daily.  Marland Kitchen losartan (COZAAR) 50 MG tablet Take 1 tablet (50 mg total) by mouth daily.  . metFORMIN (GLUCOPHAGE) 1000 MG tablet Take 1 tablet (1,000 mg total) by mouth 2 (two) times daily with a meal.  . PARoxetine (PAXIL) 40 MG tablet Take 1 tablet (40 mg total) by mouth every morning.  . potassium chloride 20 MEQ TBCR Take 20 mEq by mouth daily.  Marland Kitchen spironolactone (ALDACTONE) 25 MG tablet Take 0.5 tablets (12.5 mg total) by mouth daily.  . SYRINGE-NEEDLE, DISP, 3 ML (BD SAFETYGLIDE SYRINGE/NEEDLE) 25G X 1" 3 ML MISC Use for B12 injections  . zolpidem (AMBIEN) 5 MG tablet Take 1  tablet (5 mg total) by mouth at bedtime as needed for sleep.     Allergies:   Sulfa antibiotics   Social History   Socioeconomic History  . Marital status: Single    Spouse name: Not on file  . Number of children: Not on file  . Years of education: Not on file  . Highest education level: Not on file  Occupational History  . Not on file  Tobacco Use  . Smoking status: Never Smoker  . Smokeless tobacco: Never Used  Substance and Sexual Activity  . Alcohol use: Never  . Drug use: Never  . Sexual activity: Not on file  Other Topics Concern  . Not on file  Social History Narrative  . Not on file   Social  Determinants of Health   Financial Resource Strain:   . Difficulty of Paying Living Expenses:   Food Insecurity:   . Worried About Charity fundraiser in the Last Year:   . Arboriculturist in the Last Year:   Transportation Needs:   . Film/video editor (Medical):   Marland Kitchen Lack of Transportation (Non-Medical):   Physical Activity:   . Days of Exercise per Week:   . Minutes of Exercise per Session:   Stress:   . Feeling of Stress :   Social Connections:   . Frequency of Communication with Friends and Family:   . Frequency of Social Gatherings with Friends and Family:   . Attends Religious Services:   . Active Member of Clubs or Organizations:   . Attends Archivist Meetings:   Marland Kitchen Marital Status:      Family History:  The patient's family history includes Diabetes in his mother.   ROS:   Please see the history of present illness.    ROS All other systems reviewed and are negative.   PHYSICAL EXAM:   VS:  BP (!) 130/80   Pulse 82   Ht '6\' 2"'$  (1.88 m)   Wt 196 lb 9.6 oz (89.2 kg)   SpO2 94%   BMI 25.24 kg/m   Physical Exam  GEN: Well nourished, well developed, in no acute distress  Neck: no JVD, carotid bruits, or masses Cardiac:RRR; no murmurs, rubs, or gallops  Respiratory:  clear to auscultation bilaterally, normal work of breathing GI: soft, nontender, nondistended, + BS Ext: without cyanosis, clubbing, or edema, Good distal pulses bilaterally Neuro:  Alert and Oriented x 3  Psych: euthymic mood, full affect  Wt Readings from Last 3 Encounters:  04/14/20 196 lb 9.6 oz (89.2 kg)  04/07/20 198 lb 6.4 oz (90 kg)  04/03/20 199 lb 8 oz (90.5 kg)      Studies/Labs Reviewed:   EKG:  EKG is not ordered today.   Recent Labs: 03/30/2020: ALT 16; TSH 0.713 03/31/2020: Magnesium 2.0 04/03/2020: BUN 15; Creatinine, Ser 1.12; Hemoglobin 12.7; Platelets 209; Potassium 3.8; Sodium 138   Lipid Panel    Component Value Date/Time   CHOL 131 03/31/2020 0436   TRIG  220 (H) 03/31/2020 0436   HDL 27 (L) 03/31/2020 0436   CHOLHDL 4.9 03/31/2020 0436   VLDL 44 (H) 03/31/2020 0436   LDLCALC 60 03/31/2020 0436   LDLDIRECT 56.0 01/07/2020 1025    Additional studies/ records that were reviewed today include:  2D echo 03/30/2020 IMPRESSIONS    1. Left ventricular ejection fraction, by estimation, is 20 to 25%. The  left ventricle has severely decreased function. The left ventricle  demonstrates global hypokinesis.  The left ventricular internal cavity size  was moderately dilated. There is  moderate left ventricular hypertrophy. Left ventricular diastolic  parameters are consistent with Grade II diastolic dysfunction  (pseudonormalization). Elevated left atrial pressure.   2. Right ventricular systolic function is normal. The right ventricular  size is normal.   3. The mitral valve is normal in structure. Mild mitral valve  regurgitation. No evidence of mitral stenosis.   4. The aortic valve is tricuspid. Aortic valve regurgitation is not  visualized. Mild aortic valve sclerosis is present, with no evidence of  aortic valve stenosis.   5. Aortic dilatation noted. There is mild dilatation of the aortic root  measuring 38 mm.    Cardiac Cath 03/31/2020 Conclusion    Ischemic cardiomyopathy with dilated LV and estimated ejection fraction 25%.  End-diastolic pressure between 18 and 21 mmHg consistent with chronic systolic heart failure.  Severe diffuse three-vessel coronary artery disease.  Left main is widely patent  LAD gives origin to a large diagonal which contains 90% proximal to mid stenosis in the entire mid to distal LAD is diffusely diseased in segments up to 90%.  Severe disease in the circumflex coronary artery with mid to distal high-grade multifocal stenosis in the large first obtuse marginal.  The second obtuse marginal is totally occluded.  The third obtuse marginal as mild to moderate diffuse disease without focal severe  obstruction.  Right coronary is dominant.  Distal 50% RCA stenosis.  Diffuse high-grade proximal and distal PDA stenoses greater than 90% and subtotal occlusion of the continuation of the right coronary with small left ventricular branch is noted.  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  If so, would be reasonable to ask the cardiac surgical team whether the patient has adequate targets for grafting (seems unlikely to me).  There are no reasonable PCI options.  Medical therapy for heart failure, strong anti-ischemic regimen, and consideration of resynchronization/ICD.   Cardiac MRI 04/01/2020 IMPRESSION: 1. 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 Focal asymmetric hypertrophy measuring up to 22m in mid inferoseptum (960min posterior wall), which meets criteria for hypertrophic cardiomyopathy. In addition, there is dense midwall late gadolinium enhancement in the basal to mid septum. This is consistent with HCM. Amyloidosis would also be on differential, as can present with asymmetric hypertrophy and similar LGE pattern. ECV is markedly elevated in the basal septum (65%) but in areas without LGE while the ECV is elevated (35%), it is not in amyloid range (<40%). Suspect HCM as the diagnosis, but would consider checking serum light chains and PYP scan to rule out amyloid   3. Subendocardial LGE consistent with prior infarct in portion of the basal inferior/lateral walls. >50% transmural LGE suggests nonviability in this area. Remainder of myocardium appears viable   4. Moderate LV dilatation with moderate systolic dysfunction (EF 3396%  5.  Normal RV size and systolic function (EF 5729% Cardiac cath 03/31/20    Ischemic cardiomyopathy with dilated LV and estimated ejection  fraction 25%.  End-diastolic pressure between 18 and 21 mmHg consistent with chronic systolic heart failure.  Severe diffuse three-vessel coronary artery disease.  Left main is widely patent  LAD gives origin to a large diagonal which contains 90% proximal to mid stenosis in the entire mid to distal LAD is diffusely  diseased in segments up to 90%.  Severe disease in the circumflex coronary artery with mid to distal high-grade multifocal stenosis in the large first obtuse marginal.  The second obtuse marginal is totally occluded.  The third obtuse marginal as mild to moderate diffuse disease without focal severe obstruction.  Right coronary is dominant.  Distal 50% RCA stenosis.  Diffuse high-grade proximal and distal PDA stenoses greater than 90% and subtotal occlusion of the continuation of the right coronary with small left ventricular branch is noted.  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  If so, would be reasonable to ask the cardiac surgical team whether the patient has adequate targets for grafting (seems unlikely to me).  There are no reasonable PCI options.  Medical therapy for heart failure, strong anti-ischemic regimen, and consideration of resynchronization/ICD.     Carotid dopplers 03/2020 Right Carotid: Velocities in the right ICA are consistent with a 1-39%  stenosis.   Left Carotid: Velocities in the left ICA are consistent with a 1-39%  stenosis.   Vertebrals: Bilateral vertebral arteries demonstrate antegrade flow.  Subclavians: Normal flow hemodynamics were seen in bilateral subclavian               arteries.   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  Consider repeat exam in the future if continued concern for transthyretin amyloidosis.     Electronically Signed   By: Suzy Bouchard M.D.   On: 04/03/2020 08:36  MR Cardiac 03/2020 IMPRESSION: 1. 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 Focal asymmetric hypertrophy measuring up to 2m in mid inferoseptum (965min posterior wall), which meets criteria for hypertrophic cardiomyopathy. In addition, there is dense midwall late gadolinium enhancement in the basal to mid septum. This is consistent with HCM. Amyloidosis would also be on differential, as can present with asymmetric hypertrophy and similar LGE pattern. ECV is markedly elevated in the basal septum (65%) but in areas without LGE while the ECV is elevated (35%), it is not in amyloid range (<40%). Suspect HCM as the diagnosis, but would consider checking serum light chains and PYP scan to rule out amyloid   3. Subendocardial LGE consistent with prior infarct in portion of the basal inferior/lateral walls. >50% transmural LGE suggests nonviability in this area. Remainder of myocardium appears viable   4. Moderate LV dilatation with moderate systolic dysfunction (EF 3348%  5.  Normal RV size and systolic function (EF 5727%    Electronically Signed   By: ChOswaldo MilianD   On: 04/02/2020 00:13      ASSESSMENT:    1. Syncope, unspecified syncope type   2. [place] ([place]rophic [place]) [place]  3. Coronary artery disease involving native coronary artery of native heart without angina pectoris   4. Ischemic cardiomyopathy   5. Hyperlipidemia, unspecified hyperlipidemia type      PLAN:  In order of problems listed above:  Syncope question etiology likely orthostatic was in a hot car and walking to the grocery store.  He does have significant three-vessel CAD and severe LV dysfunction so arrhythmia could be possibility.  MRI consistent with HOCM and concerns  for amyloid.  PYP scan equivocal for amyloid,  SPEP and UPEP were negative.  EP to see for possible ICD and could be a candidate for CRT-D given LBBB and ischemic dilated cardiomyopathy. No further problems since discharge. Wearing life vest.Will arrange f/u with Dr. Radford Pax and Dr. Lovena Le.  CAD three-vessel no targets for PCI turned down for CABG due to diabetic vessels and no adequate distal targets.  On aspirin, statin, Imdur.  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  EP recommended LifeVest and follow-up in clinic to determine if candidate for ICD.  HLD LDL was 60 but with significant CAD target less than 50.  Atorvastatin increased to 80 mg daily.  Will need repeat in 6 to 8 weeks.  HTN BP controlled   Medication Adjustments/Labs and Tests Ordered: Current medicines are reviewed at length with the patient today.  Concerns regarding medicines are outlined above.  Medication changes, Labs and Tests ordered today are listed in the Patient Instructions below. There are no Patient Instructions on file for this visit.   Sumner Boast, PA-C  04/14/2020 11:47 AM    [facility] [address], [place], [place]  [address] Phone: [phone]; Fax: [phone]

## 2020-04-14 ENCOUNTER — Ambulatory Visit: Payer: Medicare Other | Admitting: Physician Assistant

## 2020-04-14 ENCOUNTER — Encounter: Payer: Self-pay | Admitting: Physician Assistant

## 2020-04-14 ENCOUNTER — Other Ambulatory Visit: Payer: Self-pay

## 2020-04-14 VITALS — BP 130/80 | HR 82 | Ht 74.0 in | Wt 196.6 lb

## 2020-04-14 DIAGNOSIS — I255 Ischemic cardiomyopathy: Secondary | ICD-10-CM

## 2020-04-14 DIAGNOSIS — I421 Obstructive hypertrophic cardiomyopathy: Secondary | ICD-10-CM | POA: Diagnosis not present

## 2020-04-14 DIAGNOSIS — R55 Syncope and collapse: Secondary | ICD-10-CM

## 2020-04-14 DIAGNOSIS — E785 Hyperlipidemia, unspecified: Secondary | ICD-10-CM

## 2020-04-14 DIAGNOSIS — I2510 Atherosclerotic heart disease of native coronary artery without angina pectoris: Secondary | ICD-10-CM

## 2020-04-14 NOTE — Patient Instructions (Signed)
Medication Instructions:  Your physician recommends that you continue on your current medications as directed. Please refer to the Current Medication list given to you today.  *If you need a refill on your cardiac medications before your next appointment, please call your pharmacy*   Lab Work: None If you have labs (blood work) drawn today and your tests are completely normal, you will receive your results only by: Marland Kitchen MyChart Message (if you have MyChart) OR . A paper copy in the mail If you have any lab test that is abnormal or we need to change your treatment, we will call you to review the results.   Testing/Procedures: None   Follow-Up: At [facility], you and your health needs are our priority.  As part of our continuing mission to provide you with exceptional heart care, we have created designated Provider Care Teams.  These Care Teams include your primary Cardiologist (physician) and Advanced Practice Providers (APPs -  Physician Assistants and Nurse Practitioners) who all work together to provide you with the care you need, when you need it.  We recommend signing up for the patient portal called "MyChart".  Sign up information is provided on this After Visit Summary.  MyChart is used to connect with patients for Virtual Visits (Telemedicine).  Patients are able to view lab/test results, encounter notes, upcoming appointments, etc.  Non-urgent messages can be sent to your provider as well.   To learn more about what you can do with MyChart, go to [email or web address].    Your next appointment:   As scheduled with Dr Mayford Knife on 05/22/2020 and Dr Ladona Ridgel on 05/28/2020  The format for your next appointment:   In Person  Other Instructions None

## 2020-04-29 ENCOUNTER — Telehealth: Payer: Self-pay | Admitting: Internal Medicine

## 2020-04-29 DIAGNOSIS — F411 Generalized anxiety disorder: Secondary | ICD-10-CM

## 2020-04-29 MED ORDER — PAROXETINE HCL 40 MG PO TABS: 40.0000 mg | ORAL_TABLET | ORAL | 1 refills | Status: DC

## 2020-04-29 NOTE — Telephone Encounter (Signed)
Pt wants a call back so he can come into the office to get a COVID test. He said he had it before in the office.  I asked if he was talking about the antibody test, he said no he had the actual test done in office.  He would like it again and want someone to call him back to schedule it.  Please advise.

## 2020-04-29 NOTE — Telephone Encounter (Signed)
Pt called back being very rude stated the we better call him back with a referral to get a COVID test from the pharmacy b/c they said the he needs one or he will find him another doctor.

## 2020-04-29 NOTE — Telephone Encounter (Signed)
Spoke pharmacist and they have an OTC available for patient or he can call and schedule an appointment at [facility] or another pharmacy.  Refill of Paxil was sent.  Patient is aware.

## 2020-05-21 NOTE — Progress Notes (Signed)
Cardiology Office Note    Date:  05/22/2020   ID:  Less, Woolsey [birth year], MRN [id number]  PCP:  Isaac Bliss, Rayford Halsted, MD  Cardiologist: Fransico Him, MD EPS: None  Chief Complaint  Patient presents with  . Follow-up    syncope, CAD, DCM, HLD, HTN    History of Present Illness:  Ryan [name] is a 79 y.o. male with history of hypertension, HLD, DM, anxiety and  syncope that was likely orthostatic in origin as it was in a hot car and walking into the grocery store.  He was found to have severe LV dysfunction so arrhythmia was a possibility as well.  EF 20 to 25% by echo, cardiac cath with three-vessel CAD no targets for PCI and turned down for CABG, cardiac MRI showed HOCM but also concern for amyloid with a late dense mid wall LGE in the basal to mid septum and mid inferior septum 17 mm in thickness.  PYP scan was equivocal for amyloid.  SPEP and UPEP were negative.  Given HOCM diagnosis and syncope as well as LV dysfunction a Lifevest was placed with referral EP as he would be a candidate for CRT-D given LBBB and ischemic DCM.  He has an appt with EP next week.   He is here today for followup and is doing well.  he denies any chest pain or pressure, SOB, DOE, PND, orthopnea, LE edema, dizziness, palpitations or syncope. He is compliant with his meds and is tolerating meds with no SE.    Past Medical History:  Diagnosis Date  . B12 deficiency   . BPH (benign prostatic hyperplasia)   . CAD (coronary artery disease), native coronary artery    cardiac cath with three-vessel CAD no targets for PCI and turned down for CABG,  . DM (diabetes mellitus), type 2, uncontrolled w/neurologic complication ([place])   . GAD (generalized anxiety disorder)   . HOCM (hypertrophic obstructive cardiomyopathy) ([place])    dx by cardiac MRI  . HTN (hypertension)   . Hyperlipidemia   . Ischemic cardiomyopathy    EF 20-25% felt mixed etiology nonischemic/ischemic  . Neuropathy,  peripheral     Past Surgical History:  Procedure Laterality Date  . RIGHT/LEFT HEART CATH AND CORONARY ANGIOGRAPHY N/A 03/31/2020   Procedure: RIGHT/LEFT HEART CATH AND CORONARY ANGIOGRAPHY;  Surgeon: Belva Crome, MD;  Location: [facility];  Service: Cardiovascular;  Laterality: N/A;    Current Medications: Current Meds  Medication Sig  . aspirin EC 81 MG tablet Take 81 mg by mouth daily. Swallow whole.  Marland Kitchen atorvastatin (LIPITOR) 80 MG tablet Take 1 tablet (80 mg total) by mouth at bedtime.  . blood glucose meter kit and supplies KIT Dispense based on patient and insurance preference. Use up to four times daily as directed. (FOR ICD-9 250.00, 250.01).  . cyanocobalamin (,VITAMIN B-12,) 1000 MCG/ML injection inject 1 ml once a month  . furosemide (LASIX) 20 MG tablet Take 1 tablet (20 mg total) by mouth daily.  Marland Kitchen gabapentin (NEURONTIN) 300 MG capsule TAKE ONE CAPSULE BY MOUTH THREE TIMES A DAY  . glimepiride (AMARYL) 2 MG tablet Take 1 tablet (2 mg total) by mouth 2 (two) times daily.  . Lancets (ONETOUCH DELICA PLUS [id number]) MISC Apply 1 each topically 4 (four) times daily.  Marland Kitchen losartan (COZAAR) 50 MG tablet Take 1 tablet (50 mg total) by mouth daily.  . metFORMIN (GLUCOPHAGE) 1000 MG tablet Take 1 tablet (1,000 mg total) by mouth 2 (  two) times daily with a meal.  . ONETOUCH VERIO test strip 1 each 4 (four) times daily.  Marland Kitchen PARoxetine (PAXIL) 40 MG tablet Take 1 tablet (40 mg total) by mouth every morning.  . potassium chloride 20 MEQ TBCR Take 20 mEq by mouth daily.  Marland Kitchen spironolactone (ALDACTONE) 25 MG tablet Take 0.5 tablets (12.5 mg total) by mouth daily.  . SYRINGE-NEEDLE, DISP, 3 ML (BD SAFETYGLIDE SYRINGE/NEEDLE) 25G X 1" 3 ML MISC Use for B12 injections  . zolpidem (AMBIEN) 5 MG tablet Take 1 tablet (5 mg total) by mouth at bedtime as needed for sleep.  . [DISCONTINUED] atorvastatin (LIPITOR) 80 MG tablet Take 1 tablet (80 mg total) by mouth at bedtime.  . [DISCONTINUED]  losartan (COZAAR) 50 MG tablet Take 1 tablet (50 mg total) by mouth daily.  . [DISCONTINUED] spironolactone (ALDACTONE) 25 MG tablet Take 0.5 tablets (12.5 mg total) by mouth daily.     Allergies:   Sulfa antibiotics   Social History   Socioeconomic History  . Marital status: Single    Spouse name: Not on file  . Number of children: Not on file  . Years of education: Not on file  . Highest education level: Not on file  Occupational History  . Not on file  Tobacco Use  . Smoking status: Never Smoker  . Smokeless tobacco: Never Used  Substance and Sexual Activity  . Alcohol use: Never  . Drug use: Never  . Sexual activity: Not on file  Other Topics Concern  . Not on file  Social History Narrative  . Not on file   Social Determinants of Health   Financial Resource Strain:   . Difficulty of Paying Living Expenses: Not on file  Food Insecurity:   . Worried About Charity fundraiser in the Last Year: Not on file  . Ran Out of Food in the Last Year: Not on file  Transportation Needs:   . Lack of Transportation (Medical): Not on file  . Lack of Transportation (Non-Medical): Not on file  Physical Activity:   . Days of Exercise per Week: Not on file  . Minutes of Exercise per Session: Not on file  Stress:   . Feeling of Stress : Not on file  Social Connections:   . Frequency of Communication with Friends and Family: Not on file  . Frequency of Social Gatherings with Friends and Family: Not on file  . Attends Religious Services: Not on file  . Active Member of Clubs or Organizations: Not on file  . Attends Archivist Meetings: Not on file  . Marital Status: Not on file     Family History:  The patient's family history includes Diabetes in his mother.   ROS:   Please see the history of present illness.    ROS All other systems reviewed and are negative.   PHYSICAL EXAM:   VS:  BP 130/72   Pulse 80   Ht _0  (1.88 m)   Wt 200 lb (90.7 kg)   SpO2 90%    BMI 25.68 kg/m   Physical Exam   GEN: Well nourished, well developed in no acute distress HEENT: Normal NECK: No JVD; No carotid bruits LYMPHATICS: No lymphadenopathy CARDIAC:RRR, no murmurs, rubs, gallops RESPIRATORY:  Clear to auscultation without rales, wheezing or rhonchi  ABDOMEN: Soft, non-tender, non-distended MUSCULOSKELETAL:  No edema; No deformity  SKIN: Warm and dry NEUROLOGIC:  Alert and oriented x 3 PSYCHIATRIC:  Normal affect    Wt  Readings from Last 3 Encounters:  05/22/20 200 lb (90.7 kg)  04/14/20 196 lb 9.6 oz (89.2 kg)  04/07/20 198 lb 6.4 oz (90 kg)      Studies/Labs Reviewed:   EKG:  EKG is not ordered today.   Recent Labs: 03/30/2020: ALT 16; TSH 0.713 03/31/2020: Magnesium 2.0 04/03/2020: BUN 15; Creatinine, Ser 1.12; Hemoglobin 12.7; Platelets 209; Potassium 3.8; Sodium 138   Lipid Panel    Component Value Date/Time   CHOL 131 03/31/2020 0436   TRIG 220 (H) 03/31/2020 0436   HDL 27 (L) 03/31/2020 0436   CHOLHDL 4.9 03/31/2020 0436   VLDL 44 (H) 03/31/2020 0436   LDLCALC 60 03/31/2020 0436   LDLDIRECT 56.0 01/07/2020 1025    Additional studies/ records that were reviewed today include:  2D echo 03/30/2020 IMPRESSIONS    1. Left ventricular ejection fraction, by estimation, is 20 to 25%. The  left ventricle has severely decreased function. The left ventricle  demonstrates global hypokinesis. The left ventricular internal cavity size  was moderately dilated. There is  moderate left ventricular hypertrophy. Left ventricular diastolic  parameters are consistent with Grade II diastolic dysfunction  (pseudonormalization). Elevated left atrial pressure.   2. Right ventricular systolic function is normal. The right ventricular  size is normal.   3. The mitral valve is normal in structure. Mild mitral valve  regurgitation. No evidence of mitral stenosis.   4. The aortic valve is tricuspid. Aortic valve regurgitation is not  visualized. Mild aortic  valve sclerosis is present, with no evidence of  aortic valve stenosis.   5. Aortic dilatation noted. There is mild dilatation of the aortic root  measuring 38 mm.    Cardiac Cath 03/31/2020 Conclusion    Ischemic cardiomyopathy with dilated LV and estimated ejection fraction 25%.  End-diastolic pressure between 18 and 21 mmHg consistent with chronic systolic heart failure.  Severe diffuse three-vessel coronary artery disease.  Left main is widely patent  LAD gives origin to a large diagonal which contains 90% proximal to mid stenosis in the entire mid to distal LAD is diffusely diseased in segments up to 90%.  Severe disease in the circumflex coronary artery with mid to distal high-grade multifocal stenosis in the large first obtuse marginal.  The second obtuse marginal is totally occluded.  The third obtuse marginal as mild to moderate diffuse disease without focal severe obstruction.  Right coronary is dominant.  Distal 50% RCA stenosis.  Diffuse high-grade proximal and distal PDA stenoses greater than 90% and subtotal occlusion of the continuation of the right coronary with small left ventricular branch is noted.  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  If so, would be reasonable to ask the cardiac surgical team whether the patient has adequate targets for grafting (seems unlikely to me).  There are no reasonable PCI options.  Medical therapy for heart failure, strong anti-ischemic regimen, and consideration of resynchronization/ICD.   Cardiac MRI 04/01/2020 IMPRESSION: 1. 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 Focal asymmetric hypertrophy measuring up to 79m in mid inferoseptum (961min posterior wall), which meets criteria for hypertrophic cardiomyopathy. In addition,  there is dense midwall late gadolinium enhancement in the basal to mid septum. This is consistent with HCM. Amyloidosis would also be on differential, as can present with asymmetric hypertrophy and similar  LGE pattern. ECV is markedly elevated in the basal septum (65%) but in areas without LGE while the ECV is elevated (35%), it is not in amyloid range (<40%). Suspect HCM as the diagnosis, but would consider checking serum light chains and PYP scan to rule out amyloid   3. Subendocardial LGE consistent with prior infarct in portion of the basal inferior/lateral walls. >50% transmural LGE suggests nonviability in this area. Remainder of myocardium appears viable   4. Moderate LV dilatation with moderate systolic dysfunction (EF 16%)   5.  Normal RV size and systolic function (EF 01%)  Cardiac cath 03/31/20    Ischemic cardiomyopathy with dilated LV and estimated ejection fraction 25%.  End-diastolic pressure between 18 and 21 mmHg consistent with chronic systolic heart failure.  Severe diffuse three-vessel coronary artery disease.  Left main is widely patent  LAD gives origin to a large diagonal which contains 90% proximal to mid stenosis in the entire mid to distal LAD is diffusely diseased in segments up to 90%.  Severe disease in the circumflex coronary artery with mid to distal high-grade multifocal stenosis in the large first obtuse marginal.  The second obtuse marginal is totally occluded.  The third obtuse marginal as mild to moderate diffuse disease without focal severe obstruction.  Right coronary is dominant.  Distal 50% RCA stenosis.  Diffuse high-grade proximal and distal PDA stenoses greater than 90% and subtotal occlusion of the continuation of the right coronary with small left ventricular branch is noted.  Mild pulmonary hypertension with mean PA pressure 26 mmHg   RECOMMENDATIONS:    Guideline directed therapy for severe systolic heart failure  Consider MRI  imaging to determine if viable myocardium is present.  If so, would be reasonable to ask the cardiac surgical team whether the patient has adequate targets for grafting (seems unlikely to me).  There are no reasonable PCI options.  Medical therapy for heart failure, strong anti-ischemic regimen, and consideration of resynchronization/ICD.     Carotid dopplers 03/2020 Right Carotid: Velocities in the right ICA are consistent with a 1-39%  stenosis.   Left Carotid: Velocities in the left ICA are consistent with a 1-39%  stenosis.   Vertebrals: Bilateral vertebral arteries demonstrate antegrade flow.  Subclavians: Normal flow hemodynamics were seen in bilateral subclavian               arteries.   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 Consider repeat exam in the future if continued concern for transthyretin amyloidosis.     Electronically Signed   By: Suzy Bouchard M.D.   On: 04/03/2020 08:36  MR Cardiac 03/2020 IMPRESSION: 1. 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 Focal asymmetric hypertrophy measuring up to 75m in mid inferoseptum (927min posterior wall), which meets criteria for hypertrophic cardiomyopathy. In addition, there is dense midwall late gadolinium enhancement in the basal to mid septum. This is consistent with HCM. Amyloidosis would also be on differential, as can present with asymmetric hypertrophy and similar LGE pattern. ECV is markedly elevated in the basal septum (65%) but in areas without LGE while the ECV is elevated (35%), it is not in amyloid range (<40%). Suspect HCM as the diagnosis, but would consider checking serum light chains and PYP scan to rule out amyloid   3. Subendocardial LGE consistent with prior infarct in portion of the basal  inferior/lateral walls. >50%  transmural LGE suggests nonviability in this area. Remainder of myocardium appears viable   4. Moderate LV dilatation with moderate systolic dysfunction (EF 44%)   5.  Normal RV size and systolic function (EF 92%)     Electronically Signed   By: Oswaldo Milian MD   On: 04/02/2020 00:13      ASSESSMENT:    1. Syncope and collapse   2. Coronary artery disease involving native coronary artery of native heart without angina pectoris   3. Ischemic cardiomyopathy   4. Hyperlipidemia, unspecified hyperlipidemia type   5. Essential hypertension   6. HOCM (hypertrophic obstructive cardiomyopathy) ([place])   7. 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  Syncope  - etiology likely orthostatic was in a hot car and walking to the grocery store.   -He does have significant three-vessel CAD and severe LV dysfunction so arrhythmia could be possibility.  -MRI consistent with HOCM and concerns for amyloid but PYP scan equivocal for amyloid and SPEP and UPEP were negative.   -he currently is wearing a LifeVest and has an appt with Dr. Lovena Le with EP  for possible ICD and could be a candidate for CRT-D given LBBB and ischemic dilated cardiomyopathy.  -No further problems since discharge -will repeat 2D echo after on max med therapy but based on HOCM dx and syncope not sure if he is a candidate for ICD even if LVF improves on medical therapy for HF  2.  ASCAD -cath showed three-vessel CAD with no targets for PCI  - turned down for CABG due to diabetic vessels and no adequate distal targets.   -he has not had any anginal sx -continue ASA, statin and Imdur -no BB due to bradycardia in hospital but HR good today>>may be able to add on at some point  3.  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  HLD  -LDL was 60 but with significant CAD target less than 50.   -Atorvastatin increased to 80 mg daily.   -repeat FLP and ALT  5.  HTN  -BP controlled on exam today -continue spiro -changing Losartan to Entresto  6.  Type 2 DM with neuropathy -followed by PCP -continue metformin 100035mID  Medication Adjustments/Labs and Tests Ordered: Current medicines are reviewed at length with the patient today.  Concerns regarding medicines are outlined above.  Medication changes, Labs and Tests ordered today are listed in the Patient Instructions below. There are no Patient Instructions on file for this visit.   Signed, TraFransico HimD  05/22/2020 10:26 AM    [facility] [id number]  [id number]: [phone]: [phone]

## 2020-05-22 ENCOUNTER — Other Ambulatory Visit: Payer: Self-pay

## 2020-05-22 ENCOUNTER — Ambulatory Visit (INDEPENDENT_AMBULATORY_CARE_PROVIDER_SITE_OTHER): Payer: Medicare Other | Admitting: Cardiology

## 2020-05-22 ENCOUNTER — Encounter: Payer: Self-pay | Admitting: Cardiology

## 2020-05-22 VITALS — BP 130/72 | HR 80 | Ht 74.0 in | Wt 200.0 lb

## 2020-05-22 DIAGNOSIS — I421 Obstructive hypertrophic cardiomyopathy: Secondary | ICD-10-CM

## 2020-05-22 DIAGNOSIS — R55 Syncope and collapse: Secondary | ICD-10-CM

## 2020-05-22 DIAGNOSIS — I2510 Atherosclerotic heart disease of native coronary artery without angina pectoris: Secondary | ICD-10-CM | POA: Diagnosis not present

## 2020-05-22 DIAGNOSIS — E1142 Type 2 diabetes mellitus with diabetic polyneuropathy: Secondary | ICD-10-CM

## 2020-05-22 DIAGNOSIS — I255 Ischemic cardiomyopathy: Secondary | ICD-10-CM | POA: Diagnosis not present

## 2020-05-22 DIAGNOSIS — E785 Hyperlipidemia, unspecified: Secondary | ICD-10-CM

## 2020-05-22 DIAGNOSIS — I10 Essential (primary) hypertension: Secondary | ICD-10-CM

## 2020-05-22 MED ORDER — LOSARTAN POTASSIUM 50 MG PO TABS: 50.0000 mg | ORAL_TABLET | Freq: Every day | ORAL | 3 refills | Status: DC

## 2020-05-22 MED ORDER — ENTRESTO 24-26 MG PO TABS: 1.0000 | ORAL_TABLET | Freq: Two times a day (BID) | ORAL | 11 refills | Status: DC

## 2020-05-22 MED ORDER — SPIRONOLACTONE 25 MG PO TABS: 12.5000 mg | ORAL_TABLET | Freq: Every day | ORAL | 3 refills | Status: DC

## 2020-05-22 MED ORDER — ATORVASTATIN CALCIUM 80 MG PO TABS: 80.0000 mg | ORAL_TABLET | Freq: Every day | ORAL | 3 refills | Status: DC

## 2020-05-22 NOTE — Addendum Note (Signed)
Addended by: Theresia Majors on: 05/22/2020 10:37 AM   Modules accepted: Orders

## 2020-05-22 NOTE — Patient Instructions (Signed)
Medication Instructions:  Your physician has recommended you make the following change in your medication:  1) STOP taking losartan  2) START taking Entresto 24-26 twice per day   *If you need a refill on your cardiac medications before your next appointment, please call your pharmacy*  Lab Work: Fasting lipids and CMET in two weeks.  If you have labs (blood work) drawn today and your tests are completely normal, you will receive your results only by: Marland Kitchen MyChart Message (if you have MyChart) OR . A paper copy in the mail If you have any lab test that is abnormal or we need to change your treatment, we will call you to review the results.   Follow-Up: At [facility], you and your health needs are our priority.  As part of our continuing mission to provide you with exceptional heart care, we have created designated Provider Care Teams.  These Care Teams include your primary Cardiologist (physician) and Advanced Practice Providers (APPs -  Physician Assistants and Nurse Practitioners) who all work together to provide you with the care you need, when you need it.  We recommend signing up for the patient portal called "MyChart".  Sign up information is provided on this After Visit Summary.  MyChart is used to connect with patients for Virtual Visits (Telemedicine).  Patients are able to view lab/test results, encounter notes, upcoming appointments, etc.  Non-urgent messages can be sent to your provider as well.   To learn more about what you can do with MyChart, go to [email or web address].    Your next appointment:   2 month(s)  The format for your next appointment:   In Person  Provider:   You may see Armanda Magic, MD or one of the following Advanced Practice Providers on your designated Care Team:    Ronie Spies, PA-C  Jacolyn Reedy, PA-C   Other Instructions You have been referred to see PharmD in two weeks. Please come fasting to this appointment so that we can check you  lipids.

## 2020-05-28 ENCOUNTER — Other Ambulatory Visit: Payer: Self-pay

## 2020-05-28 ENCOUNTER — Ambulatory Visit: Payer: Medicare Other | Admitting: Internal Medicine

## 2020-05-28 ENCOUNTER — Encounter: Payer: Self-pay | Admitting: Internal Medicine

## 2020-05-28 VITALS — BP 130/82 | HR 69 | Ht 74.0 in | Wt 198.0 lb

## 2020-05-28 DIAGNOSIS — I447 Left bundle-branch block, unspecified: Secondary | ICD-10-CM

## 2020-05-28 DIAGNOSIS — I421 Obstructive hypertrophic cardiomyopathy: Secondary | ICD-10-CM

## 2020-05-28 NOTE — Progress Notes (Signed)
HPI Ryan [name] returns today for ongoing evaluation and management of chronic systolic heart failure, left bundle branch block/IVCD, severe ischemic cardiomyopathy, and syncope.  The patient has been wearing a LifeVest.  He has been tolerant and compliant with his medical therapy.  His heart failure symptoms have improved from class IIIb to class IIIa.  He has not had recurrent syncope. Allergies  Allergen Reactions  . Sulfa Antibiotics     Blood in Urine     Current Outpatient Medications  Medication Sig Dispense Refill  . aspirin EC 81 MG tablet Take 81 mg by mouth daily. Swallow whole.    Marland Kitchen atorvastatin (LIPITOR) 80 MG tablet Take 1 tablet (80 mg total) by mouth at bedtime. 90 tablet 3  . blood glucose meter kit and supplies KIT Dispense based on patient and insurance preference. Use up to four times daily as directed. (FOR ICD-9 250.00, 250.01). 1 each 0  . cyanocobalamin (,VITAMIN B-12,) 1000 MCG/ML injection inject 1 ml once a month 6 mL 3  . furosemide (LASIX) 20 MG tablet Take 1 tablet (20 mg total) by mouth daily. 30 tablet 1  . gabapentin (NEURONTIN) 300 MG capsule TAKE ONE CAPSULE BY MOUTH THREE TIMES A DAY 270 capsule 2  . glimepiride (AMARYL) 2 MG tablet Take 1 tablet (2 mg total) by mouth 2 (two) times daily. 60 tablet 1  . Lancets (ONETOUCH DELICA PLUS [id number]) MISC Apply 1 each topically 4 (four) times daily.    . metFORMIN (GLUCOPHAGE) 1000 MG tablet Take 1 tablet (1,000 mg total) by mouth 2 (two) times daily with a meal. 60 tablet 1  . ONETOUCH VERIO test strip 1 each 4 (four) times daily.    Marland Kitchen PARoxetine (PAXIL) 40 MG tablet Take 1 tablet (40 mg total) by mouth every morning. 90 tablet 1  . potassium chloride 20 MEQ TBCR Take 20 mEq by mouth daily. 30 tablet 1  . sacubitril-valsartan (ENTRESTO) 24-26 MG Take 1 tablet by mouth 2 (two) times daily. 60 tablet 11  . spironolactone (ALDACTONE) 25 MG tablet Take 0.5 tablets (12.5 mg total) by mouth daily. 45 tablet 3   . SYRINGE-NEEDLE, DISP, 3 ML (BD SAFETYGLIDE SYRINGE/NEEDLE) 25G X 1" 3 ML MISC Use for B12 injections 100 each 11  . zolpidem (AMBIEN) 5 MG tablet Take 1 tablet (5 mg total) by mouth at bedtime as needed for sleep. 30 tablet 1   No current facility-administered medications for this visit.     Past Medical History:  Diagnosis Date  . B12 deficiency   . BPH (benign prostatic hyperplasia)   . CAD (coronary artery disease), native coronary artery    cardiac cath with three-vessel CAD no targets for PCI and turned down for CABG,  . DM (diabetes mellitus), type 2, uncontrolled w/neurologic complication ([place])   . GAD (generalized anxiety disorder)   . HOCM (hypertrophic obstructive cardiomyopathy) ([place])    dx by cardiac MRI  . HTN (hypertension)   . Hyperlipidemia   . Ischemic cardiomyopathy    EF 20-25% felt mixed etiology nonischemic/ischemic  . Neuropathy, peripheral     ROS:   All systems reviewed and negative except as noted in the HPI.   Past Surgical History:  Procedure Laterality Date  . RIGHT/LEFT HEART CATH AND CORONARY ANGIOGRAPHY N/A 03/31/2020   Procedure: RIGHT/LEFT HEART CATH AND CORONARY ANGIOGRAPHY;  Surgeon: Belva Crome, MD;  Location: [facility];  Service: Cardiovascular;  Laterality: N/A;  Family History  Problem Relation Age of Onset  . Diabetes Mother      Social History   Socioeconomic History  . Marital status: Single    Spouse name: Not on file  . Number of children: Not on file  . Years of education: Not on file  . Highest education level: Not on file  Occupational History  . Not on file  Tobacco Use  . Smoking status: Never Smoker  . Smokeless tobacco: Never Used  Substance and Sexual Activity  . Alcohol use: Never  . Drug use: Never  . Sexual activity: Not on file  Other Topics Concern  . Not on file  Social History Narrative  . Not on file   Social Determinants of Health   Financial Resource Strain:   .  Difficulty of Paying Living Expenses: Not on file  Food Insecurity:   . Worried About Charity fundraiser in the Last Year: Not on file  . Ran Out of Food in the Last Year: Not on file  Transportation Needs:   . Lack of Transportation (Medical): Not on file  . Lack of Transportation (Non-Medical): Not on file  Physical Activity:   . Days of Exercise per Week: Not on file  . Minutes of Exercise per Session: Not on file  Stress:   . Feeling of Stress : Not on file  Social Connections:   . Frequency of Communication with Friends and Family: Not on file  . Frequency of Social Gatherings with Friends and Family: Not on file  . Attends Religious Services: Not on file  . Active Member of Clubs or Organizations: Not on file  . Attends Archivist Meetings: Not on file  . Marital Status: Not on file  Intimate Partner Violence:   . Fear of Current or Ex-Partner: Not on file  . Emotionally Abused: Not on file  . Physically Abused: Not on file  . Sexually Abused: Not on file     BP 130/82   Pulse 69   Ht '6\' 2"'  (1.88 m)   Wt 198 lb (89.8 kg)   SpO2 96%   BMI 25.42 kg/m   Physical Exam:  Well appearing 79 year old man, NAD HEENT: Unremarkable Neck: 7 cm JVD, no thyromegally Lymphatics:  No adenopathy Back:  No CVA tenderness Lungs:  Clear, with no wheezes, rales, or rhonchi HEART:  Regular rate rhythm, no murmurs, no rubs, no clicks Abd:  soft, positive bowel sounds, no organomegally, no rebound, no guarding Ext:  2 plus pulses, no edema, no cyanosis, no clubbing Skin:  No rashes no nodules Neuro:  CN II through XII intact, motor grossly intact  EKG -normal sinus rhythm with left bundle branch block  Assess/Plan: 1.  Chronic systolic heart failure -he is currently on guideline directed medical therapy.  He will repeat his 2D echo in several weeks.  If his ejection fraction remains below 35%, I would anticipate ICD implantation and with his left bundle branch block/IVCD,  I would recommend biventricular device insertion. 2.  Hypertrophic cardiomyopathy -the patient has a thickened ventricular septum as previously noted.  This was recently diagnosed.  He is not on a beta-blocker secondary to bradycardia. 3.  Ischemic cardiomyopathy -he has severe three-vessel disease but was felt to be a poor candidate for surgery in part secondary to diffuse disease. 4.  Syncope -the etiology is unclear.  He has not had recurrent syncope since discharge from the hospital and his LifeVest has not gone off.  Crissie Sickles, MD

## 2020-05-28 NOTE — Patient Instructions (Addendum)
Medication Instructions:  Your physician recommends that you continue on your current medications as directed. Please refer to the Current Medication list given to you today.  Labwork: None ordered.  Testing/Procedures: Your physician has requested that you have an echocardiogram. Echocardiography is a painless test that uses sound waves to create images of your heart. It provides your doctor with information about the size and shape of your heart and how well your heart's chambers and valves are working. This procedure takes approximately one hour. There are no restrictions for this procedure.  Please schedule for ECHO in mid October   Follow-Up:  The following dates are available in October for your procedure: October 18, 21, 25  Any Other Special Instructions Will Be Listed Below (If Applicable).  If you need a refill on your cardiac medications before your next appointment, please call your pharmacy.    Cardioverter Defibrillator Implantation  An implantable cardioverter defibrillator (ICD) is a small device that is placed under the skin in the chest or abdomen. An ICD consists of a battery, a small computer (pulse generator), and wires (leads) that go into the heart. An ICD is used to detect and correct two types of dangerous irregular heartbeats (arrhythmias):  A rapid heart rhythm (tachycardia).  An arrhythmia in which the lower chambers of the heart (ventricles) contract in an uncoordinated way (fibrillation). When an ICD detects tachycardia, it sends a low-energy shock to the heart to restore the heartbeat to normal (cardioversion). This signal is usually painless. If cardioversion does not work or if the ICD detects fibrillation, it delivers a high-energy shock to the heart (defibrillation) to restart the heart. This shock may feel like a strong jolt in the chest. Your health care provider may prescribe an ICD if:  You have had an arrhythmia that originated in the  ventricles.  Your heart has been damaged by a disease or heart condition. Sometimes, ICDs are programmed to act as a device called a pacemaker. Pacemakers can be used to treat a slow heartbeat (bradycardia) or tachycardia by taking over the heart rate with electrical impulses. Tell a health care provider about:  Any allergies you have.  All medicines you are taking, including vitamins, herbs, eye drops, creams, and over-the-counter medicines.  Any problems you or family members have had with anesthetic medicines.  Any blood disorders you have.  Any surgeries you have had.  Any medical conditions you have.  Whether you are pregnant or may be pregnant. What are the risks? Generally, this is a safe procedure. However, problems may occur, including:  Swelling, bleeding, or bruising.  Infection.  Blood clots.  Damage to other structures or organs, such as nerves, blood vessels, or the heart.  Allergic reactions to medicines used during the procedure. What happens before the procedure? Staying hydrated Follow instructions from your health care provider about hydration, which may include:  Up to 2 hours before the procedure - you may continue to drink clear liquids, such as water, clear fruit juice, black coffee, and plain tea. Eating and drinking restrictions Follow instructions from your health care provider about eating and drinking, which may include:  8 hours before the procedure - stop eating heavy meals or foods such as meat, fried foods, or fatty foods.  6 hours before the procedure - stop eating light meals or foods, such as toast or cereal.  6 hours before the procedure - stop drinking milk or drinks that contain milk.  2 hours before the procedure - stop drinking  clear liquids. Medicine Ask your health care provider about:  Changing or stopping your normal medicines. This is important if you take diabetes medicines or blood thinners.  Taking medicines such as  aspirin and ibuprofen. These medicines can thin your blood. Do not take these medicines before your procedure if your doctor tells you not to. Tests  You may have blood tests.  You may have a test to check the electrical signals in your heart (electrocardiogram, ECG).  You may have imaging tests, such as a chest X-ray. General instructions  For 24 hours before the procedure, stop using products that contain nicotine or tobacco, such as cigarettes and e-cigarettes. If you need help quitting, ask your health care provider.  Plan to have someone take you home from the hospital or clinic.  You may be asked to shower with a germ-killing soap. What happens during the procedure?  To reduce your risk of infection: ? Your health care team will wash or sanitize their hands. ? Your skin will be washed with soap. ? Hair may be removed from the surgical area.  Small monitors will be put on your body. They will be used to check your heart, blood pressure, and oxygen level.  An IV tube will be inserted into one of your veins.  You will be given one or more of the following: ? A medicine to help you relax (sedative). ? A medicine to numb the area (local anesthetic). ? A medicine to make you fall asleep (general anesthetic).  Leads will be guided through a blood vessel into your heart and attached to your heart muscles. Depending on the ICD, the leads may go into one ventricle or they may go into both ventricles and into an upper chamber of the heart. An X-ray machine (fluoroscope) will be usedto help guide the leads.  A small incision will be made to create a deep pocket under your skin.  The pulse generator will be placed into the pocket.  The ICD will be tested.  The incision will be closed with stitches (sutures), skin glue, or staples.  A bandage (dressing) will be placed over the incision. This procedure may vary among health care providers and hospitals. What happens after the  procedure?  Your blood pressure, heart rate, breathing rate, and blood oxygen level will be monitored often until the medicines you were given have worn off.  A chest X-ray will be taken to check that the ICD is in the right place.  You will need to stay in the hospital for 1-2 days so your health care provider can make sure your ICD is working.  Do not drive for 24 hours if you received a sedative. Ask your health care provider when it is safe for you to drive.  You may be given an identification card explaining that you have an ICD. Summary  An implantable cardioverter defibrillator (ICD) is a small device that is placed under the skin in the chest or abdomen. It is used to detect and correct dangerous irregular heartbeats (arrhythmias).  An ICD consists of a battery, a small computer (pulse generator), and wires (leads) that go into the heart.  When an ICD detects rapid heart rhythm (tachycardia), it sends a low-energy shock to the heart to restore the heartbeat to normal (cardioversion). If cardioversion does not work or if the ICD detects uncoordinated heart contractions (fibrillation), it delivers a high-energy shock to the heart (defibrillation) to restart the heart.  You will need to stay in  the hospital for 1-2 days to make sure your ICD is working. This information is not intended to replace advice given to you by your health care provider. Make sure you discuss any questions you have with your health care provider. Document Revised: 08/18/2017 Document Reviewed: 09/14/2016 Elsevier Patient Education  2020 [employer].

## 2020-06-03 NOTE — Addendum Note (Signed)
Addended by: Krystyna Cleckley H on: 06/03/2020 04:59 PM   Modules accepted: Orders  

## 2020-06-07 NOTE — Progress Notes (Signed)
Patient ID: Ryan [name]                 DOB: 05/26/1941                      MRN: [id number]     HPI: Ryan [name] is a 79 y.o. male referred by Dr. Radford Pax to pharmacy clinic for HF medication management. PMH is significant for HTN, HLD, DM, anxiety, and syncopy (hospitalized 03/29/20). Most recent LVEF 20-25% by echo on 03/30/2020. Patient is currently wearing LifeVest.  Patient last saw Dr. Radford Pax on 05/22/2020. At that time, patient was switched from losartan to Entresto 24-26 mg BID. Patient has not been put on beta blocker due to bradycardia.  Patient presents to pharmacy clinic today for follow-up labs and HF medication titration. He is tolerating Entresto well and reports feeling great with no dizziness or weakness. Denies any swelling or SOB. Patient does not check his BP at home but will frequently check it at [facility] when he picks up his medications or does grocery shopping. He reports readings are usually around 120-130/72-80. 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 Today, patient's office BP is 118/78 and HR 82.  Patient reports he has changed his diet and eats plenty of protein (eggs, chicken, pork) and vegetables. Patient's son is a Lawyer who helps him meal prep. Patient does not add salt to food. Patient knows he does not walk as much as he should because of the hot weather. However, he does walk his dog for about 35-60 minutes at a time.  Current CHF meds: Entresto 24-26 mg twice daily. Spironolactone 12.5 mg daily. Previously tried: Losartan 50 mg daily (switched to Entresto) BP goal:  <130/80  Family History: Diabetes (mother)  Social History: Never smoker. No alcohol.  Diet:  Breakfast/lunch: 2 eggs, small piece of chicken or pork, gritz or cereal Dinner: chicken (boiled or baked), broccoli, carrots, son is Lawyer helps meal prep Drinks water, soft drink once in while Does not add  salt   Exercise: Not walking as much because of hot weather, walks dog 35-60 min at a time  Home BP readings: 120-130/72-80  Wt Readings from Last 3 Encounters:  05/28/20 198 lb (89.8 kg)  05/22/20 200 lb (90.7 kg)  04/14/20 196 lb 9.6 oz (89.2 kg)   BP Readings from Last 3 Encounters:  05/28/20 130/82  05/22/20 130/72  04/14/20 (!) 130/80   Pulse Readings from Last 3 Encounters:  05/28/20 69  05/22/20 80  04/14/20 82    Renal function: CrCl cannot be calculated (Patient's most recent lab result is older than the maximum 21 days allowed.).  Past Medical History:  Diagnosis Date   B12 deficiency    BPH (benign prostatic hyperplasia)    CAD (coronary artery disease), native coronary artery    cardiac cath with three-vessel CAD no targets for PCI and turned down for CABG,   DM (diabetes mellitus), type 2, uncontrolled w/neurologic complication ([facility])    GAD (generalized anxiety disorder)    HOCM (hypertrophic obstructive cardiomyopathy) ([place])    dx by cardiac MRI   HTN (hypertension)    Hyperlipidemia    Ischemic cardiomyopathy    EF 20-25% felt mixed etiology nonischemic/ischemic   Neuropathy, peripheral     Current Outpatient Medications on File Prior to Visit  Medication Sig Dispense Refill   aspirin EC 81 MG tablet Take  81 mg by mouth daily. Swallow whole.     atorvastatin (LIPITOR) 80 MG tablet Take 1 tablet (80 mg total) by mouth at bedtime. 90 tablet 3   blood glucose meter kit and supplies KIT Dispense based on patient and insurance preference. Use up to four times daily as directed. (FOR ICD-9 250.00, 250.01). 1 each 0   cyanocobalamin (,VITAMIN B-12,) 1000 MCG/ML injection inject 1 ml once a month 6 mL 3   furosemide (LASIX) 20 MG tablet Take 1 tablet (20 mg total) by mouth daily. 30 tablet 1   gabapentin (NEURONTIN) 300 MG capsule TAKE ONE CAPSULE BY MOUTH THREE TIMES A DAY 270 capsule 2   glimepiride (AMARYL) 2 MG tablet Take 1 tablet (2 mg  total) by mouth 2 (two) times daily. 60 tablet 1   Lancets (ONETOUCH DELICA PLUS [id number]) MISC Apply 1 each topically 4 (four) times daily.     metFORMIN (GLUCOPHAGE) 1000 MG tablet Take 1 tablet (1,000 mg total) by mouth 2 (two) times daily with a meal. 60 tablet 1   ONETOUCH VERIO test strip 1 each 4 (four) times daily.     PARoxetine (PAXIL) 40 MG tablet Take 1 tablet (40 mg total) by mouth every morning. 90 tablet 1   potassium chloride 20 MEQ TBCR Take 20 mEq by mouth daily. 30 tablet 1   sacubitril-valsartan (ENTRESTO) 24-26 MG Take 1 tablet by mouth 2 (two) times daily. 60 tablet 11   spironolactone (ALDACTONE) 25 MG tablet Take 0.5 tablets (12.5 mg total) by mouth daily. 45 tablet 3   SYRINGE-NEEDLE, DISP, 3 ML (BD SAFETYGLIDE SYRINGE/NEEDLE) 25G X 1" 3 ML MISC Use for B12 injections 100 each 11   zolpidem (AMBIEN) 5 MG tablet Take 1 tablet (5 mg total) by mouth at bedtime as needed for sleep. 30 tablet 1   No current facility-administered medications on file prior to visit.    Allergies  Allergen Reactions   Sulfa Antibiotics     Blood in Urine     Assessment/Plan:  1. CHF - Patient's office BP today is within goal of <130/80. Patient got labs drawn today. Plan to increase Entresto to 49-51 mg BID if renal function and potassium level are stable. Asked patient to begin checking his BP and HR readings daily at home, as these readings may be impacted by his HF medications. Encouraged patient to continue his diet and increase his daily exercise by taking more walks. Plan to follow up with patient via phone call tomorrow when labs return. Will schedule next follow-up visit then. Can consider increasing spironolactone to 25 mg at next visit. Still holding off on beta blockers for now until after patient gets ICD implanted.    Esmeralda Links (PharmD Candidate 2022)  Ramond Dial, Pharm.D, BCPS, CPP [place]  [address] [place], [place]  [address]  Phone: [phone]; Fax: [phone]

## 2020-06-08 ENCOUNTER — Ambulatory Visit (INDEPENDENT_AMBULATORY_CARE_PROVIDER_SITE_OTHER): Payer: Medicare Other | Admitting: Pharmacist

## 2020-06-08 ENCOUNTER — Other Ambulatory Visit: Payer: Self-pay

## 2020-06-08 ENCOUNTER — Other Ambulatory Visit: Payer: Medicare Other | Admitting: *Deleted

## 2020-06-08 VITALS — BP 118/78 | HR 82

## 2020-06-08 DIAGNOSIS — I10 Essential (primary) hypertension: Secondary | ICD-10-CM

## 2020-06-08 DIAGNOSIS — I421 Obstructive hypertrophic cardiomyopathy: Secondary | ICD-10-CM

## 2020-06-08 DIAGNOSIS — I5021 Acute systolic (congestive) heart failure: Secondary | ICD-10-CM | POA: Diagnosis not present

## 2020-06-08 DIAGNOSIS — E785 Hyperlipidemia, unspecified: Secondary | ICD-10-CM

## 2020-06-08 DIAGNOSIS — I447 Left bundle-branch block, unspecified: Secondary | ICD-10-CM

## 2020-06-09 ENCOUNTER — Telehealth: Payer: Self-pay

## 2020-06-09 DIAGNOSIS — I5021 Acute systolic (congestive) heart failure: Secondary | ICD-10-CM

## 2020-06-09 LAB — LIPID PANEL
Chol/HDL Ratio: 4.1 ratio (ref 0.0–5.0)
Cholesterol, Total: 110 mg/dL (ref 100–199)
HDL: 27 mg/dL — ABNORMAL LOW (ref >39)
LDL Chol Calc (NIH): 45 mg/dL (ref 0–99)
Triglycerides: 241 mg/dL — ABNORMAL HIGH (ref 0–149)
VLDL Cholesterol Cal: 38 mg/dL (ref 5–40)

## 2020-06-09 LAB — CBC WITH DIFFERENTIAL/PLATELET
Basophils Absolute: 0.1 10*3/uL (ref 0.0–0.2)
Basos: 1 %
EOS (ABSOLUTE): 0.2 10*3/uL (ref 0.0–0.4)
Eos: 2 %
Hematocrit: 46.6 % (ref 37.5–51.0)
Hemoglobin: 15.8 g/dL (ref 13.0–17.7)
Immature Grans (Abs): 0 10*3/uL (ref 0.0–0.1)
Immature Granulocytes: 0 %
Lymphocytes Absolute: 3 10*3/uL (ref 0.7–3.1)
Lymphs: 30 %
MCH: 30.2 pg (ref 26.6–33.0)
MCHC: 33.9 g/dL (ref 31.5–35.7)
MCV: 89 fL (ref 79–97)
Monocytes Absolute: 0.8 10*3/uL (ref 0.1–0.9)
Monocytes: 8 %
Neutrophils Absolute: 6 10*3/uL (ref 1.4–7.0)
Neutrophils: 59 %
Platelets: 283 10*3/uL (ref 150–450)
RBC: 5.23 x10E6/uL (ref 4.14–5.80)
RDW: 13.6 % (ref 11.6–15.4)
WBC: 10 10*3/uL (ref 3.4–10.8)

## 2020-06-09 LAB — COMPREHENSIVE METABOLIC PANEL
ALT: 14 IU/L (ref 0–44)
AST: 16 IU/L (ref 0–40)
Albumin/Globulin Ratio: 2 (ref 1.2–2.2)
Albumin: 4.9 g/dL — ABNORMAL HIGH (ref 3.7–4.7)
Alkaline Phosphatase: 77 IU/L (ref 44–121)
BUN/Creatinine Ratio: 21 (ref 10–24)
BUN: 25 mg/dL (ref 8–27)
Bilirubin Total: 0.5 mg/dL (ref 0.0–1.2)
CO2: 18 mmol/L — ABNORMAL LOW (ref 20–29)
Calcium: 10 mg/dL (ref 8.6–10.2)
Chloride: 101 mmol/L (ref 96–106)
Creatinine, Ser: 1.19 mg/dL (ref 0.76–1.27)
GFR calc Af Amer: 68 mL/min/{1.73_m2} (ref >59)
GFR calc non Af Amer: 59 mL/min/{1.73_m2} — ABNORMAL LOW (ref >59)
Globulin, Total: 2.5 g/dL (ref 1.5–4.5)
Glucose: 143 mg/dL — ABNORMAL HIGH (ref 65–99)
Potassium: 4.9 mmol/L (ref 3.5–5.2)
Sodium: 142 mmol/L (ref 134–144)
Total Protein: 7.4 g/dL (ref 6.0–8.5)

## 2020-06-09 MED ORDER — ENTRESTO 49-51 MG PO TABS: 1.0000 | ORAL_TABLET | Freq: Two times a day (BID) | ORAL | 11 refills | Status: DC

## 2020-06-09 NOTE — Telephone Encounter (Addendum)
Successfully contaced patient and informed him that labs were normal. We will increase Entresto to 49/51 mg BID. Patient will double up on current tablets (24/26 mg) until he runs out before he starts the new dose tablets. Patient's potassium was within normal range but on higher end. We will check labs in 1 week on 06/15/2020 to keep a closer eye on potassium level. Advised to decrease his banana intake. We will follow up with patient in clinic in 2 weeks on 06/22/2020. Patient verbalized understanding of plan.   Lucina Mellow (PharmD Candidate 2022)

## 2020-06-15 ENCOUNTER — Other Ambulatory Visit: Payer: Self-pay

## 2020-06-15 ENCOUNTER — Other Ambulatory Visit: Payer: Medicare Other | Admitting: *Deleted

## 2020-06-15 ENCOUNTER — Telehealth: Payer: Self-pay | Admitting: Pharmacist

## 2020-06-15 DIAGNOSIS — I5021 Acute systolic (congestive) heart failure: Secondary | ICD-10-CM

## 2020-06-15 LAB — BASIC METABOLIC PANEL
BUN/Creatinine Ratio: 18 (ref 10–24)
BUN: 21 mg/dL (ref 8–27)
CO2: 23 mmol/L (ref 20–29)
Calcium: 9.6 mg/dL (ref 8.6–10.2)
Chloride: 105 mmol/L (ref 96–106)
Creatinine, Ser: 1.14 mg/dL (ref 0.76–1.27)
GFR calc Af Amer: 72 mL/min/{1.73_m2} (ref >59)
GFR calc non Af Amer: 62 mL/min/{1.73_m2} (ref >59)
Glucose: 137 mg/dL — ABNORMAL HIGH (ref 65–99)
Potassium: 4.5 mmol/L (ref 3.5–5.2)
Sodium: 142 mmol/L (ref 134–144)

## 2020-06-15 NOTE — Telephone Encounter (Signed)
Patient stopped by office asking about coupon for entresto. Unfortunately he has medicare and cannot use monthly copay card. However, he can apply for patient assistance. I brought him the application which he filled out while he was here. Application faxed to [employer].

## 2020-06-16 ENCOUNTER — Telehealth: Payer: Self-pay

## 2020-06-16 NOTE — Telephone Encounter (Signed)
Successfully contacted patient to inform him that labs drawn yesterday on 9/27 were normal (K 4.5, Scr 1.14). We will continue Entresto 49/51 mg BID for now. Patient has follow-up appointment with pharmacy team on 10/4. Will determine if we can further increase Entresto dose at that time. Also informed patient that his patient assistance paperwork has been faxed and we are waiting to hear back. In the meantime, if patient runs out of his current tablets, he can come to clinic to get samples. Patient verbalized understanding of plan.   Lucina Mellow (PharmD Candidate 2022)

## 2020-06-19 ENCOUNTER — Telehealth: Payer: Self-pay | Admitting: Pharmacist

## 2020-06-19 NOTE — Telephone Encounter (Signed)
Received approval letter from [employer] for patient assistance for [employer]. Pt has follow up on 10/4 with PharmD and may be pending Entresto dose increase at that time. Will discuss with pt at upcoming visit.

## 2020-06-21 NOTE — Progress Notes (Addendum)
Patient ID: Ryan [name]                 DOB: Oct 27, 1940                      MRN: [id number]     HPI: Ryan [name] is a 79 y.o. male referred by Dr. Radford Pax to pharmacy clinic for HF medication management. PMH is significant for chronic systolic HF, left bundle branch block/IVCD, HTN, HLD, DM, anxiety, and syncopy (hospitalized 03/29/20). Most recent LVEF 20-25% by echo on 03/30/2020 (repeat ECHO scheduled for 07/02/2020). He is currently wearing LifeVest, and has not been placed on a beta blocker due to bradycardia. He is scheduled for ICD implant on 07/06/2020.   He last saw Dr. Radford Pax on 05/22/2020. At that time, he was switched from losartan to Entresto 24-26 mg BID. He was seen in the pharmacy clinic on 9/20 where he reported tolerating Entresto well and his office BP/HR were within normal limits. His Entresto was subsequently increased to 49-51 mg BID. He has not been started on a beta blocker due to bradycardia.  Patient presents to clinic today for follow-up HF medication titration. He reports tolerating his recent increase in [place] without any dizziness, weakness, swelling, or SOB. He has had no further syncope events. Since his last visit, he has obtained a blood pressure cuff and checks his pressures at home. His evening readings have been 120-130/70-72. In clinic, his BP was 138/82 and HR was 79. He reports improvements in exercise, now walking up to 60 minutes daily and doing some sit ups and leg kicks. His most recent triglycerides were elevated at 241, A1c of 10.4. His son still helps with maintaining his diet and says he tries to stay away from fried/fatty foods. He discussed snacking habits, salt intake, and heart healthy options. He does not have any concerns for his upcoming procedure other than expressing a desire to continue exercising to strengthen his heart for the surgery.   Unfortunately, he has medicare and cannot use monthly copay card. However, he was recently  approved by [employer] for patient assistance to cover cost of his Delene Loll.  He ran out of Entresto yesterday morning. Will give him a sample, until his shipment comes.  Current CHF meds: Entresto 49-51 mg twice daily. Spironolactone 12.5 mg daily. Previously tried: Losartan 50 mg daily (switched to Entresto) BP goal:  <130/80  Family History: Diabetes (mother)  Social History: Never smoker. No alcohol. Sleeps 8-10 hours a night and usually wakes up for the bathroom once during the night.   Diet:  Patient's son is a Lawyer who helps him meal prep.  Breakfast: 2 eggs, small piece of chicken or pork, gritz or cereal, water or coffee (1-2 cups occasionally) or iced tea  Lunch: baked chicken, salad, boiled eggs Dinner: baked chicken (boiled or baked), broccoli, carrots Snacks: Lance crackers, trail mix  Sometimes goes to [place] (once every couple of weeks) Drinks water, soft drink once in while Does not add salt to his food. Avoids fried foods.   Exercise: Has been increasing his walks to 60 minutes daily when the weather allows. Does some sit ups and leg kicks as able.   Home BP readings: 120-130/70-72  Wt Readings from Last 3 Encounters:  05/28/20 198 lb (89.8 kg)  05/22/20 200 lb (90.7 kg)  04/14/20 196 lb 9.6 oz (89.2 kg)   BP Readings from Last 3 Encounters:  06/08/20 118/78  05/28/20 130/82  05/22/20 130/72   Pulse Readings from Last 3 Encounters:  06/08/20 82  05/28/20 69  05/22/20 80    Renal function: CrCl cannot be calculated (Unknown ideal weight.).  Past Medical History:  Diagnosis Date  . B12 deficiency   . BPH (benign prostatic hyperplasia)   . CAD (coronary artery disease), native coronary artery    cardiac cath with three-vessel CAD no targets for PCI and turned down for CABG,  . DM (diabetes mellitus), type 2, uncontrolled w/neurologic complication ([place])   . GAD (generalized anxiety disorder)   . HOCM (hypertrophic obstructive cardiomyopathy)  ([place])    dx by cardiac MRI  . HTN (hypertension)   . Hyperlipidemia   . Ischemic cardiomyopathy    EF 20-25% felt mixed etiology nonischemic/ischemic  . Neuropathy, peripheral     Current Outpatient Medications on File Prior to Visit  Medication Sig Dispense Refill  . aspirin EC 81 MG tablet Take 81 mg by mouth daily. Swallow whole.    Marland Kitchen atorvastatin (LIPITOR) 80 MG tablet Take 1 tablet (80 mg total) by mouth at bedtime. 90 tablet 3  . blood glucose meter kit and supplies KIT Dispense based on patient and insurance preference. Use up to four times daily as directed. (FOR ICD-9 250.00, 250.01). 1 each 0  . cyanocobalamin (,VITAMIN B-12,) 1000 MCG/ML injection inject 1 ml once a month 6 mL 3  . furosemide (LASIX) 20 MG tablet Take 1 tablet (20 mg total) by mouth daily. 30 tablet 1  . gabapentin (NEURONTIN) 300 MG capsule TAKE ONE CAPSULE BY MOUTH THREE TIMES A DAY 270 capsule 2  . glimepiride (AMARYL) 2 MG tablet Take 1 tablet (2 mg total) by mouth 2 (two) times daily. 60 tablet 1  . Lancets (ONETOUCH DELICA PLUS [id number]) MISC Apply 1 each topically 4 (four) times daily.    . metFORMIN (GLUCOPHAGE) 1000 MG tablet Take 1 tablet (1,000 mg total) by mouth 2 (two) times daily with a meal. 60 tablet 1  . ONETOUCH VERIO test strip 1 each 4 (four) times daily.    Marland Kitchen PARoxetine (PAXIL) 40 MG tablet Take 1 tablet (40 mg total) by mouth every morning. 90 tablet 1  . potassium chloride 20 MEQ TBCR Take 20 mEq by mouth daily. 30 tablet 1  . sacubitril-valsartan (ENTRESTO) 49-51 MG Take 1 tablet by mouth 2 (two) times daily. 60 tablet 11  . spironolactone (ALDACTONE) 25 MG tablet Take 0.5 tablets (12.5 mg total) by mouth daily. 45 tablet 3  . SYRINGE-NEEDLE, DISP, 3 ML (BD SAFETYGLIDE SYRINGE/NEEDLE) 25G X 1" 3 ML MISC Use for B12 injections 100 each 11  . zolpidem (AMBIEN) 5 MG tablet Take 1 tablet (5 mg total) by mouth at bedtime as needed for sleep. 30 tablet 1   No current facility-administered  medications on file prior to visit.    Allergies  Allergen Reactions  . Sulfa Antibiotics     Blood in Urine     Assessment/Plan:  1. CHF - Patient's office BP today is slightly above goal of <130/80. He has tolerated his current dose of Entresto without issue, thus we will increase his dose to 97-103 mg BID for better guideline directed heart failure management. His last fill of Entresto ran out yesterday, so we provided a sample to cover him until his new prescription is sent. We are continuing to hold off on beta blockers for now given history of bradycardia; likely until patient gets ICD implanted (scheduled for 07/06/2020). He is scheduled to  have a repeat ECHO on 07/02/2020 and we will have labs drawn (including an A1c) when he is in the building that day. Given his elevated A1c and CHF, we discussed the possibility of adding an SGLT2 inhibitor at a future date. He will likely need patient assistance for that as well if we go down that path. He was encouraged to continue his exercise routine and to keep working on heart healthy diet modifications.   Claudina Lick, PharmD PGY1 Acute Care Pharmacy Resident

## 2020-06-22 ENCOUNTER — Ambulatory Visit (INDEPENDENT_AMBULATORY_CARE_PROVIDER_SITE_OTHER): Payer: Medicare Other | Admitting: Pharmacist

## 2020-06-22 ENCOUNTER — Other Ambulatory Visit: Payer: Self-pay

## 2020-06-22 DIAGNOSIS — E1149 Type 2 diabetes mellitus with other diabetic neurological complication: Secondary | ICD-10-CM

## 2020-06-22 DIAGNOSIS — I421 Obstructive hypertrophic cardiomyopathy: Secondary | ICD-10-CM

## 2020-06-22 DIAGNOSIS — E1165 Type 2 diabetes mellitus with hyperglycemia: Secondary | ICD-10-CM

## 2020-06-22 DIAGNOSIS — I5021 Acute systolic (congestive) heart failure: Secondary | ICD-10-CM | POA: Diagnosis not present

## 2020-06-22 DIAGNOSIS — IMO0002 Reserved for concepts with insufficient information to code with codable children: Secondary | ICD-10-CM

## 2020-06-22 MED ORDER — ENTRESTO 97-103 MG PO TABS: 1.0000 | ORAL_TABLET | Freq: Two times a day (BID) | ORAL | 3 refills | Status: DC

## 2020-06-22 NOTE — Patient Instructions (Addendum)
Thank you for visiting [place] in clinic today!  Today your blood pressure was slightly above goal (<130/80 mmHg) at 138/82.   Keep up the good work with diet and exercise!   Continue to check your blood pressure at home and bring a list of your values to your next appointment.   We will increase your Entresto dose to 97-103 mg.  We will give you a sample today of the 49-51 mg tablets, please take 2 tablets twice a day until you run out. We will send a prescription for the higher dose 97-103 mg tablets today, and you can start taking 1 tablet twice a day once you have that new prescription.   Medications Entresto 97-103 mg twice daily Spironolactone 12.5 mg daily   We will have labs drawn on Thursday, October 14th when you come for your ECHO. Please stop by the lab any time that day. We will see you here in clinic again on Tuesday, October 26th at 10:30am.

## 2020-07-02 ENCOUNTER — Ambulatory Visit (HOSPITAL_COMMUNITY): Payer: Medicare Other | Attending: Cardiology

## 2020-07-02 ENCOUNTER — Other Ambulatory Visit: Payer: Self-pay

## 2020-07-02 ENCOUNTER — Other Ambulatory Visit: Payer: Medicare Other | Admitting: *Deleted

## 2020-07-02 DIAGNOSIS — I421 Obstructive hypertrophic cardiomyopathy: Secondary | ICD-10-CM | POA: Diagnosis present

## 2020-07-02 DIAGNOSIS — E1165 Type 2 diabetes mellitus with hyperglycemia: Secondary | ICD-10-CM

## 2020-07-02 DIAGNOSIS — E1149 Type 2 diabetes mellitus with other diabetic neurological complication: Secondary | ICD-10-CM

## 2020-07-02 DIAGNOSIS — IMO0002 Reserved for concepts with insufficient information to code with codable children: Secondary | ICD-10-CM

## 2020-07-02 LAB — ECHOCARDIOGRAM COMPLETE
Area-P 1/2: 2.4 cm2
S' Lateral: 4.6 cm

## 2020-07-03 ENCOUNTER — Telehealth: Payer: Self-pay

## 2020-07-03 ENCOUNTER — Telehealth: Payer: Self-pay | Admitting: Pharmacist

## 2020-07-03 LAB — BASIC METABOLIC PANEL
BUN/Creatinine Ratio: 17 (ref 10–24)
BUN: 20 mg/dL (ref 8–27)
CO2: 23 mmol/L (ref 20–29)
Calcium: 9.4 mg/dL (ref 8.6–10.2)
Chloride: 101 mmol/L (ref 96–106)
Creatinine, Ser: 1.18 mg/dL (ref 0.76–1.27)
GFR calc Af Amer: 69 mL/min/{1.73_m2} (ref >59)
GFR calc non Af Amer: 60 mL/min/{1.73_m2} (ref >59)
Glucose: 171 mg/dL — ABNORMAL HIGH (ref 65–99)
Potassium: 4.1 mmol/L (ref 3.5–5.2)
Sodium: 137 mmol/L (ref 134–144)

## 2020-07-03 LAB — HEMOGLOBIN A1C
Est. average glucose Bld gHb Est-mCnc: 163 mg/dL
Hgb A1c MFr Bld: 7.3 % — ABNORMAL HIGH (ref 4.8–5.6)

## 2020-07-03 NOTE — Telephone Encounter (Signed)
Call placed to Pt.  Advised of echo results.  Advised we will proceed with device implant as scheduled.  All questions answered.

## 2020-07-03 NOTE — Telephone Encounter (Signed)
BMP is stable. A1C is much improved. Down to 7.3 from 10.4. Revived results with patient who was very pleased. Congratulated him on is success in lowering his A1C

## 2020-07-03 NOTE — Progress Notes (Signed)
Instructed patient on the following items: °Arrival time 0530 °Nothing to eat or drink after midnight °No meds AM of procedure °Responsible person to drive you home and stay with you for 24 hrs ° ° °   °

## 2020-07-04 ENCOUNTER — Other Ambulatory Visit (HOSPITAL_COMMUNITY)
Admission: RE | Admit: 2020-07-04 | Discharge: 2020-07-04 | Disposition: A | Discharge status: Home / Self Care | Payer: Medicare Other | Source: Ambulatory Visit | Attending: Internal Medicine | Admitting: Internal Medicine

## 2020-07-04 DIAGNOSIS — Z01812 Encounter for preprocedural laboratory examination: Secondary | ICD-10-CM | POA: Insufficient documentation

## 2020-07-04 DIAGNOSIS — Z20822 Contact with and (suspected) exposure to covid-19: Secondary | ICD-10-CM | POA: Insufficient documentation

## 2020-07-04 LAB — SARS CORONAVIRUS 2 (TAT 6-24 HRS): SARS Coronavirus 2: NEGATIVE

## 2020-07-06 ENCOUNTER — Inpatient Hospital Stay (HOSPITAL_COMMUNITY): Payer: Medicare Other

## 2020-07-06 ENCOUNTER — Observation Stay (HOSPITAL_BASED_OUTPATIENT_CLINIC_OR_DEPARTMENT_OTHER): Payer: Medicare Other

## 2020-07-06 ENCOUNTER — Inpatient Hospital Stay (HOSPITAL_COMMUNITY)
Admission: AD | Disposition: A | Discharge status: Home / Self Care | Payer: Medicare Other | Source: Home / Self Care | Attending: Internal Medicine

## 2020-07-06 ENCOUNTER — Encounter (HOSPITAL_COMMUNITY)
Admission: AD | Disposition: A | Discharge status: Home / Self Care | Payer: Self-pay | Source: Home / Self Care | Attending: Internal Medicine

## 2020-07-06 ENCOUNTER — Inpatient Hospital Stay (HOSPITAL_COMMUNITY)
Admission: AD | Admit: 2020-07-06 | Discharge: 2020-07-08 | DRG: 227 | Disposition: A | Discharge status: Home / Self Care | Payer: Medicare Other | Attending: Internal Medicine | Admitting: Internal Medicine

## 2020-07-06 ENCOUNTER — Other Ambulatory Visit: Payer: Self-pay

## 2020-07-06 ENCOUNTER — Ambulatory Visit (HOSPITAL_COMMUNITY): Admit: 2020-07-06 | Payer: Medicare Other | Admitting: Internal Medicine

## 2020-07-06 ENCOUNTER — Ambulatory Visit (HOSPITAL_COMMUNITY): Payer: Medicare Other

## 2020-07-06 DIAGNOSIS — E1140 Type 2 diabetes mellitus with diabetic neuropathy, unspecified: Secondary | ICD-10-CM | POA: Diagnosis present

## 2020-07-06 DIAGNOSIS — I110 Hypertensive heart disease with heart failure: Secondary | ICD-10-CM | POA: Diagnosis present

## 2020-07-06 DIAGNOSIS — Z95810 Presence of automatic (implantable) cardiac defibrillator: Secondary | ICD-10-CM

## 2020-07-06 DIAGNOSIS — I313 Pericardial effusion (noninflammatory): Secondary | ICD-10-CM

## 2020-07-06 DIAGNOSIS — E538 Deficiency of other specified B group vitamins: Secondary | ICD-10-CM | POA: Diagnosis present

## 2020-07-06 DIAGNOSIS — I959 Hypotension, unspecified: Secondary | ICD-10-CM | POA: Diagnosis present

## 2020-07-06 DIAGNOSIS — Z882 Allergy status to sulfonamides status: Secondary | ICD-10-CM

## 2020-07-06 DIAGNOSIS — I421 Obstructive hypertrophic cardiomyopathy: Secondary | ICD-10-CM | POA: Diagnosis present

## 2020-07-06 DIAGNOSIS — Z7982 Long term (current) use of aspirin: Secondary | ICD-10-CM

## 2020-07-06 DIAGNOSIS — T82120A Displacement of cardiac electrode, initial encounter: Secondary | ICD-10-CM | POA: Diagnosis not present

## 2020-07-06 DIAGNOSIS — E785 Hyperlipidemia, unspecified: Secondary | ICD-10-CM | POA: Diagnosis present

## 2020-07-06 DIAGNOSIS — I2510 Atherosclerotic heart disease of native coronary artery without angina pectoris: Secondary | ICD-10-CM | POA: Diagnosis present

## 2020-07-06 DIAGNOSIS — I447 Left bundle-branch block, unspecified: Secondary | ICD-10-CM | POA: Diagnosis present

## 2020-07-06 DIAGNOSIS — I3139 Other pericardial effusion (noninflammatory): Secondary | ICD-10-CM | POA: Diagnosis present

## 2020-07-06 DIAGNOSIS — Z79899 Other long term (current) drug therapy: Secondary | ICD-10-CM

## 2020-07-06 DIAGNOSIS — Z7984 Long term (current) use of oral hypoglycemic drugs: Secondary | ICD-10-CM | POA: Diagnosis not present

## 2020-07-06 DIAGNOSIS — Z23 Encounter for immunization: Secondary | ICD-10-CM | POA: Diagnosis present

## 2020-07-06 DIAGNOSIS — Z833 Family history of diabetes mellitus: Secondary | ICD-10-CM | POA: Diagnosis not present

## 2020-07-06 DIAGNOSIS — Y831 Surgical operation with implant of artificial internal device as the cause of abnormal reaction of the patient, or of later complication, without mention of misadventure at the time of the procedure: Secondary | ICD-10-CM | POA: Diagnosis present

## 2020-07-06 DIAGNOSIS — I9581 Postprocedural hypotension: Secondary | ICD-10-CM | POA: Diagnosis not present

## 2020-07-06 DIAGNOSIS — I5022 Chronic systolic (congestive) heart failure: Secondary | ICD-10-CM | POA: Diagnosis present

## 2020-07-06 DIAGNOSIS — R55 Syncope and collapse: Secondary | ICD-10-CM | POA: Diagnosis present

## 2020-07-06 DIAGNOSIS — Y713 Surgical instruments, materials and cardiovascular devices (including sutures) associated with adverse incidents: Secondary | ICD-10-CM | POA: Diagnosis present

## 2020-07-06 DIAGNOSIS — I255 Ischemic cardiomyopathy: Secondary | ICD-10-CM | POA: Diagnosis present

## 2020-07-06 DIAGNOSIS — Z20822 Contact with and (suspected) exposure to covid-19: Secondary | ICD-10-CM | POA: Diagnosis present

## 2020-07-06 DIAGNOSIS — N400 Enlarged prostate without lower urinary tract symptoms: Secondary | ICD-10-CM | POA: Diagnosis present

## 2020-07-06 HISTORY — DX: Presence of automatic (implantable) cardiac defibrillator: Z95.810

## 2020-07-06 HISTORY — PX: BIV ICD INSERTION CRT-D: EP1195

## 2020-07-06 HISTORY — PX: LEAD REVISION/REPAIR: EP1213

## 2020-07-06 LAB — CBC
HCT: 38.1 % — ABNORMAL LOW (ref 39.0–52.0)
Hemoglobin: 12.7 g/dL — ABNORMAL LOW (ref 13.0–17.0)
MCH: 29.9 pg (ref 26.0–34.0)
MCHC: 33.3 g/dL (ref 30.0–36.0)
MCV: 89.6 fL (ref 80.0–100.0)
Platelets: 198 10*3/uL (ref 150–400)
RBC: 4.25 MIL/uL (ref 4.22–5.81)
RDW: 13.5 % (ref 11.5–15.5)
WBC: 10.5 10*3/uL (ref 4.0–10.5)
nRBC: 0 % (ref 0.0–0.2)

## 2020-07-06 LAB — GLUCOSE, CAPILLARY
Glucose-Capillary: 149 mg/dL — ABNORMAL HIGH (ref 70–99)
Glucose-Capillary: 125 mg/dL — ABNORMAL HIGH (ref 70–99)
Glucose-Capillary: 179 mg/dL — ABNORMAL HIGH (ref 70–99)
Glucose-Capillary: 225 mg/dL — ABNORMAL HIGH (ref 70–99)

## 2020-07-06 LAB — ECHOCARDIOGRAM LIMITED
Height: 74 in
Weight: 3056 oz

## 2020-07-06 SURGERY — LEAD REVISION/REPAIR

## 2020-07-06 SURGERY — BIV ICD INSERTION CRT-D

## 2020-07-06 MED ORDER — SODIUM CHLORIDE 0.9% FLUSH
3.0000 mL | Freq: Two times a day (BID) | INTRAVENOUS | Status: DC
  Administered 2020-07-06 – 2020-07-08 (×3): 3 mL via INTRAVENOUS

## 2020-07-06 MED ORDER — CHLORHEXIDINE GLUCONATE 4 % EX LIQD: 4.0000 "application " | Freq: Once | CUTANEOUS | Status: DC

## 2020-07-06 MED ORDER — CHLORHEXIDINE GLUCONATE CLOTH 2 % EX PADS
6.0000 | MEDICATED_PAD | Freq: Every day | CUTANEOUS | Status: DC
  Administered 2020-07-06: 6 via TOPICAL

## 2020-07-06 MED ORDER — ONDANSETRON HCL 4 MG/2ML IJ SOLN
4.0000 mg | Freq: Four times a day (QID) | INTRAMUSCULAR | Status: DC | PRN
  Administered 2020-07-06: 4 mg via INTRAVENOUS
  Filled 2020-07-06: qty 2

## 2020-07-06 MED ORDER — ACETAMINOPHEN 325 MG PO TABS
325.0000 mg | ORAL_TABLET | ORAL | Status: DC | PRN
  Administered 2020-07-06 – 2020-07-07 (×4): 650 mg via ORAL
  Administered 2020-07-08: 325 mg via ORAL
  Administered 2020-07-08: 650 mg via ORAL
  Filled 2020-07-06 (×8): qty 2

## 2020-07-06 MED ORDER — CEFAZOLIN SODIUM-DEXTROSE 2-4 GM/100ML-% IV SOLN
2.0000 g | INTRAVENOUS | Status: AC
  Administered 2020-07-06: 2 g via INTRAVENOUS

## 2020-07-06 MED ORDER — DOPAMINE-DEXTROSE 3.2-5 MG/ML-% IV SOLN
0.0000 ug/kg/min | INTRAVENOUS | Status: DC
  Administered 2020-07-06: 20 ug/kg/min via INTRAVENOUS
  Filled 2020-07-06: qty 250

## 2020-07-06 MED ORDER — CEFAZOLIN SODIUM-DEXTROSE 2-4 GM/100ML-% IV SOLN
INTRAVENOUS | Status: AC
  Filled 2020-07-06: qty 100

## 2020-07-06 MED ORDER — LIDOCAINE HCL 1 % IJ SOLN
INTRAMUSCULAR | Status: AC
  Filled 2020-07-06: qty 60

## 2020-07-06 MED ORDER — ACETAMINOPHEN 325 MG PO TABS: 325.0000 mg | ORAL_TABLET | ORAL | Status: DC | PRN

## 2020-07-06 MED ORDER — ONDANSETRON HCL 4 MG/2ML IJ SOLN: 4.0000 mg | Freq: Four times a day (QID) | INTRAMUSCULAR | Status: DC | PRN

## 2020-07-06 MED ORDER — FENTANYL CITRATE (PF) 100 MCG/2ML IJ SOLN
INTRAMUSCULAR | Status: AC
  Filled 2020-07-06: qty 2

## 2020-07-06 MED ORDER — GABAPENTIN 300 MG PO CAPS
300.0000 mg | ORAL_CAPSULE | Freq: Three times a day (TID) | ORAL | Status: DC
  Administered 2020-07-06 – 2020-07-08 (×5): 300 mg via ORAL
  Filled 2020-07-06 (×5): qty 1

## 2020-07-06 MED ORDER — SODIUM CHLORIDE 0.9 % IV SOLN
INTRAVENOUS | Status: AC
  Filled 2020-07-06: qty 2

## 2020-07-06 MED ORDER — CHLORHEXIDINE GLUCONATE CLOTH 2 % EX PADS: 6.0000 | MEDICATED_PAD | Freq: Every day | CUTANEOUS | Status: DC

## 2020-07-06 MED ORDER — FUROSEMIDE 20 MG PO TABS
20.0000 mg | ORAL_TABLET | Freq: Every day | ORAL | Status: DC
  Administered 2020-07-07 – 2020-07-08 (×2): 20 mg via ORAL
  Filled 2020-07-06 (×2): qty 1

## 2020-07-06 MED ORDER — SPIRONOLACTONE 12.5 MG HALF TABLET
12.5000 mg | ORAL_TABLET | Freq: Every day | ORAL | Status: DC
  Administered 2020-07-07 – 2020-07-08 (×2): 12.5 mg via ORAL
  Filled 2020-07-06 (×2): qty 1

## 2020-07-06 MED ORDER — SODIUM CHLORIDE 0.9 % IV SOLN: INTRAVENOUS | Status: DC

## 2020-07-06 MED ORDER — CEFAZOLIN SODIUM-DEXTROSE 1-4 GM/50ML-% IV SOLN
1.0000 g | Freq: Once | INTRAVENOUS | Status: AC
  Administered 2020-07-06 (×2): 1 g via INTRAVENOUS
  Filled 2020-07-06 (×3): qty 50

## 2020-07-06 MED ORDER — SODIUM CHLORIDE 0.9% FLUSH: 3.0000 mL | INTRAVENOUS | Status: DC | PRN

## 2020-07-06 MED ORDER — IOHEXOL 350 MG/ML SOLN
INTRAVENOUS | Status: DC | PRN
  Administered 2020-07-06: 10 mL

## 2020-07-06 MED ORDER — HEPARIN (PORCINE) IN NACL 1000-0.9 UT/500ML-% IV SOLN
INTRAVENOUS | Status: AC
  Filled 2020-07-06: qty 500

## 2020-07-06 MED ORDER — FENTANYL CITRATE (PF) 100 MCG/2ML IJ SOLN
INTRAMUSCULAR | Status: DC | PRN
  Administered 2020-07-06: 25 ug via INTRAVENOUS
  Administered 2020-07-06 (×2): 12.5 ug via INTRAVENOUS
  Administered 2020-07-06: 25 ug via INTRAVENOUS
  Administered 2020-07-06 (×2): 12.5 ug via INTRAVENOUS

## 2020-07-06 MED ORDER — SODIUM CHLORIDE 0.9 % IV BOLUS
500.0000 mL | Freq: Once | INTRAVENOUS | Status: AC | PRN
  Administered 2020-07-06: 500 mL via INTRAVENOUS

## 2020-07-06 MED ORDER — MIDAZOLAM HCL 5 MG/5ML IJ SOLN
INTRAMUSCULAR | Status: DC | PRN
  Administered 2020-07-06 (×6): 1 mg via INTRAVENOUS
  Administered 2020-07-06: 2 mg via INTRAVENOUS

## 2020-07-06 MED ORDER — HEPARIN (PORCINE) IN NACL 1000-0.9 UT/500ML-% IV SOLN
INTRAVENOUS | Status: DC | PRN
  Administered 2020-07-06: 500 mL

## 2020-07-06 MED ORDER — MIDAZOLAM HCL 5 MG/5ML IJ SOLN
INTRAMUSCULAR | Status: AC
  Filled 2020-07-06: qty 5

## 2020-07-06 MED ORDER — ATORVASTATIN CALCIUM 80 MG PO TABS
80.0000 mg | ORAL_TABLET | Freq: Every day | ORAL | Status: DC
  Administered 2020-07-06 – 2020-07-07 (×2): 80 mg via ORAL
  Filled 2020-07-06 (×2): qty 1

## 2020-07-06 MED ORDER — LIDOCAINE HCL (PF) 1 % IJ SOLN
INTRAMUSCULAR | Status: DC | PRN
  Administered 2020-07-06: 50 mL

## 2020-07-06 MED ORDER — ZOLPIDEM TARTRATE 5 MG PO TABS
5.0000 mg | ORAL_TABLET | Freq: Every evening | ORAL | Status: DC | PRN
  Administered 2020-07-06 – 2020-07-07 (×2): 5 mg via ORAL
  Filled 2020-07-06 (×2): qty 1

## 2020-07-06 MED ORDER — SODIUM CHLORIDE 0.9 % IV SOLN
80.0000 mg | INTRAVENOUS | Status: AC
  Administered 2020-07-06 (×2): 80 mg
  Filled 2020-07-06: qty 2

## 2020-07-06 MED ORDER — ASPIRIN EC 81 MG PO TBEC
81.0000 mg | DELAYED_RELEASE_TABLET | Freq: Every day | ORAL | Status: DC
  Administered 2020-07-07 – 2020-07-08 (×2): 81 mg via ORAL
  Filled 2020-07-06 (×2): qty 1

## 2020-07-06 MED ORDER — SODIUM CHLORIDE 0.9 % IV SOLN: 250.0000 mL | INTRAVENOUS | Status: DC | PRN

## 2020-07-06 MED ORDER — SACUBITRIL-VALSARTAN 97-103 MG PO TABS
1.0000 | ORAL_TABLET | Freq: Two times a day (BID) | ORAL | Status: DC
  Administered 2020-07-06: 1 via ORAL
  Filled 2020-07-06 (×3): qty 1

## 2020-07-06 MED ORDER — PAROXETINE HCL 20 MG PO TABS
40.0000 mg | ORAL_TABLET | ORAL | Status: DC
  Administered 2020-07-07 – 2020-07-08 (×2): 40 mg via ORAL
  Filled 2020-07-06 (×2): qty 2

## 2020-07-06 MED ORDER — LIDOCAINE HCL (PF) 1 % IJ SOLN
INTRAMUSCULAR | Status: DC | PRN
  Administered 2020-07-06: 45 mL

## 2020-07-06 MED FILL — Medication: Qty: 1 | Status: AC

## 2020-07-06 SURGICAL SUPPLY — 15 items
ASSURA CRTD CD3369-40C (ICD Generator) ×2 IMPLANT
CABLE SURGICAL S-101-97-12 (CABLE) ×2 IMPLANT
CATH CPS DIRECT 135 DS2C020 (CATHETERS) ×1 IMPLANT
CATH JOSEPH QUAD ALLRED 6F REP (CATHETERS) ×1 IMPLANT
CPS IMPLANT KIT 410190 (MISCELLANEOUS) ×1 IMPLANT
DEFIB ASSURA CRT-D (ICD Generator) IMPLANT
LEAD DURATA 7122-65CM (Lead) ×1 IMPLANT
LEAD QUARTET 1458Q-86CM (Lead) ×1 IMPLANT
LEAD TENDRIL MRI 52CM LPA1200M (Lead) ×1 IMPLANT
PAD PRO RADIOLUCENT 2001M-C (PAD) ×2 IMPLANT
SHEATH 7FR PRELUDE SNAP 13 (SHEATH) ×1 IMPLANT
SHEATH 8FR PRELUDE SNAP 13 (SHEATH) ×1 IMPLANT
TRAY PACEMAKER INSERTION (PACKS) ×2 IMPLANT
WIRE ACUITY WHISPER EDS 4648 (WIRE) ×1 IMPLANT
WIRE HI TORQ VERSACORE-J 145CM (WIRE) ×1 IMPLANT

## 2020-07-06 SURGICAL SUPPLY — 4 items
CABLE SURGICAL S-101-97-12 (CABLE) ×2 IMPLANT
COVER DOME SNAP 22 D (MISCELLANEOUS) ×1 IMPLANT
PAD PRO RADIOLUCENT 2001M-C (PAD) ×2 IMPLANT
TRAY PACEMAKER INSERTION (PACKS) ×2 IMPLANT

## 2020-07-06 NOTE — Progress Notes (Signed)
CXR called code on pt/ while in their area, pt was unresponsive. Pt in room now PSS 13, Dr Tenny Craw assessed pt, pt continues to feel dizzy, pt in trendelenburg pt no longer diaphoretic but is pale. Pt is talking, chest surgical site  pain 3/10 which is better than earlier.

## 2020-07-06 NOTE — Significant Event (Signed)
Rapid Response Event Note   Reason for Call :  Code blue called for pt becoming clammy, unresponsive while attempting to stand from table to chair.  Initial Focused Assessment:  Pt lying on stretcher, lethargic. He is clammy, pale. He states that he started feeling lightheaded and dizzy. Staff reports that he was standing to transfer from x-ray table to chair when he went unresponsive. VS upon initial check following unresponsive event were 111/81 (92), HR 69, SpO2 100% on 15L NRB. CBG 179. Pt escorted back to short stay unit. 500 cc bolus started, then stopped per MD. Pt color improving and SBP sustaining >100. Shortly after fluids were stopped pt BP began trending down and HR dipped to the mid 30s. Pt began feeling nauseous and began dry heaving. BP 52/36, HR back up to 65 bpm. Dopamine gtt started and 500cc IVF bolus restarted. Bedside ECHO performed and STAT CBC collected.   Interventions:  -PIV x2 -EKG -500 cc bolus -Dopamine gtt -ECHO -CBC  Plan of Care:  -Pt transported to CCL  Event Summary:  MD Notified: Dr. Ladona Ridgel Call Time: 1600 Arrival Time: 1603 End Time: 1745  Jennye Moccasin, RN

## 2020-07-06 NOTE — H&P (Signed)
HPI Ryan [name] returns today for ongoing evaluation and management of chronic systolic heart failure, left bundle branch block/IVCD, severe ischemic cardiomyopathy, and syncope.  The patient has been wearing a LifeVest.  He has been tolerant and compliant with his medical therapy.  His heart failure symptoms have improved from class IIIb to class IIIa.  He has not had recurrent syncope.      Allergies  Allergen Reactions  . Sulfa Antibiotics     Blood in Urine           Current Outpatient Medications  Medication Sig Dispense Refill  . aspirin EC 81 MG tablet Take 81 mg by mouth daily. Swallow whole.    Marland Kitchen atorvastatin (LIPITOR) 80 MG tablet Take 1 tablet (80 mg total) by mouth at bedtime. 90 tablet 3  . blood glucose meter kit and supplies KIT Dispense based on patient and insurance preference. Use up to four times daily as directed. (FOR ICD-9 250.00, 250.01). 1 each 0  . cyanocobalamin (,VITAMIN B-12,) 1000 MCG/ML injection inject 1 ml once a month 6 mL 3  . furosemide (LASIX) 20 MG tablet Take 1 tablet (20 mg total) by mouth daily. 30 tablet 1  . gabapentin (NEURONTIN) 300 MG capsule TAKE ONE CAPSULE BY MOUTH THREE TIMES A DAY 270 capsule 2  . glimepiride (AMARYL) 2 MG tablet Take 1 tablet (2 mg total) by mouth 2 (two) times daily. 60 tablet 1  . Lancets (ONETOUCH DELICA PLUS [id number]) MISC Apply 1 each topically 4 (four) times daily.    . metFORMIN (GLUCOPHAGE) 1000 MG tablet Take 1 tablet (1,000 mg total) by mouth 2 (two) times daily with a meal. 60 tablet 1  . ONETOUCH VERIO test strip 1 each 4 (four) times daily.    Marland Kitchen PARoxetine (PAXIL) 40 MG tablet Take 1 tablet (40 mg total) by mouth every morning. 90 tablet 1  . potassium chloride 20 MEQ TBCR Take 20 mEq by mouth daily. 30 tablet 1  . sacubitril-valsartan (ENTRESTO) 24-26 MG Take 1 tablet by mouth 2 (two) times daily. 60 tablet 11  . spironolactone (ALDACTONE) 25 MG tablet Take 0.5 tablets (12.5 mg  total) by mouth daily. 45 tablet 3  . SYRINGE-NEEDLE, DISP, 3 ML (BD SAFETYGLIDE SYRINGE/NEEDLE) 25G X 1" 3 ML MISC Use for B12 injections 100 each 11  . zolpidem (AMBIEN) 5 MG tablet Take 1 tablet (5 mg total) by mouth at bedtime as needed for sleep. 30 tablet 1   No current facility-administered medications for this visit.         Past Medical History:  Diagnosis Date  . B12 deficiency   . BPH (benign prostatic hyperplasia)   . CAD (coronary artery disease), native coronary artery    cardiac cath with three-vessel CAD no targets for PCI and turned down for CABG,  . DM (diabetes mellitus), type 2, uncontrolled w/neurologic complication ([place])   . GAD (generalized anxiety disorder)   . HOCM (hypertrophic obstructive cardiomyopathy) ([place])    dx by cardiac MRI  . HTN (hypertension)   . Hyperlipidemia   . Ischemic cardiomyopathy    EF 20-25% felt mixed etiology nonischemic/ischemic  . Neuropathy, peripheral     ROS:   All systems reviewed and negative except as noted in the HPI.        Past Surgical History:  Procedure Laterality Date  . RIGHT/LEFT HEART CATH AND CORONARY ANGIOGRAPHY N/A 03/31/2020   Procedure: RIGHT/LEFT HEART CATH AND CORONARY ANGIOGRAPHY;  Surgeon: Belva Crome, MD;  Location: [facility];  Service: Cardiovascular;  Laterality: N/A;          Family History  Problem Relation Age of Onset  . Diabetes Mother      Social History        Socioeconomic History  . Marital status: Single    Spouse name: Not on file  . Number of children: Not on file  . Years of education: Not on file  . Highest education level: Not on file  Occupational History  . Not on file  Tobacco Use  . Smoking status: Never Smoker  . Smokeless tobacco: Never Used  Substance and Sexual Activity  . Alcohol use: Never  . Drug use: Never  . Sexual activity: Not on file  Other Topics Concern  . Not on file  Social History Narrative  .  Not on file   Social Determinants of Health      Financial Resource Strain:   . Difficulty of Paying Living Expenses: Not on file  Food Insecurity:   . Worried About Charity fundraiser in the Last Year: Not on file  . Ran Out of Food in the Last Year: Not on file  Transportation Needs:   . Lack of Transportation (Medical): Not on file  . Lack of Transportation (Non-Medical): Not on file  Physical Activity:   . Days of Exercise per Week: Not on file  . Minutes of Exercise per Session: Not on file  Stress:   . Feeling of Stress : Not on file  Social Connections:   . Frequency of Communication with Friends and Family: Not on file  . Frequency of Social Gatherings with Friends and Family: Not on file  . Attends Religious Services: Not on file  . Active Member of Clubs or Organizations: Not on file  . Attends Archivist Meetings: Not on file  . Marital Status: Not on file  Intimate Partner Violence:   . Fear of Current or Ex-Partner: Not on file  . Emotionally Abused: Not on file  . Physically Abused: Not on file  . Sexually Abused: Not on file     BP 130/82   Pulse 69   Ht 6' 2" (1.88 m)   Wt 198 lb (89.8 kg)   SpO2 96%   BMI 25.42 kg/m   Physical Exam:  Well appearing 79 year old man, NAD HEENT: Unremarkable Neck: 7 cm JVD, no thyromegally Lymphatics:  No adenopathy Back:  No CVA tenderness Lungs:  Clear, with no wheezes, rales, or rhonchi HEART:  Regular rate rhythm, no murmurs, no rubs, no clicks Abd:  soft, positive bowel sounds, no organomegally, no rebound, no guarding Ext:  2 plus pulses, no edema, no cyanosis, no clubbing Skin:  No rashes no nodules Neuro:  CN II through XII intact, motor grossly intact  EKG -normal sinus rhythm with left bundle branch block  Assess/Plan: 1.  Chronic systolic heart failure -he is currently on guideline directed medical therapy.  He will repeat his 2D echo in several weeks.  If his ejection fraction  remains below 35%, I would anticipate ICD implantation and with his left bundle branch block/IVCD, I would recommend biventricular device insertion. 2.  Hypertrophic cardiomyopathy -the patient has a thickened ventricular septum as previously noted.  This was recently diagnosed.  He is not on a beta-blocker secondary to bradycardia. 3.  Ischemic cardiomyopathy -he has severe three-vessel disease but was felt to be a poor candidate for  surgery in part secondary to diffuse disease. 4.  Syncope -the etiology is unclear.  He has not had recurrent syncope since discharge from the hospital and his LifeVest has not gone off.  Ryan Peru, MD  EP Attending  Patient seen and examined. Agree with above. For Biv ICD insertion.   Ryan Overlie Micharl Helmes,MD

## 2020-07-06 NOTE — H&P (Signed)
ICD Criteria  Current LVEF:25%. Within 12 months prior to implant: Yes   Heart failure history: Yes, Class III  Cardiomyopathy history: Yes, Ischemic Cardiomyopathy - Prior MI.  Atrial Fibrillation/Atrial Flutter: No.  Ventricular tachycardia history: No.  Cardiac arrest history: No.  History of syndromes with risk of sudden death: No.  Previous ICD: No.  Current ICD indication: Primary  PPM indication: No.  Class I or II Bradycardia indication present: No  Beta Blocker therapy for 3 or more months: No, medical reason.  Ace Inhibitor/ARB therapy for 3 or more months: Yes, prescribed.    I have seen Ryan [name] is a 79 y.o. malepre-procedural and has been referred by Dr. Mayford Knife for consideration of ICD implant for primary prevention of sudden death.  The patient's chart has been reviewed and they meet criteria for ICD implant.  I have had a thorough discussion with the patient reviewing options.  The patient and their family (if available) have had opportunities to ask questions and have them answered. The patient and I have decided together through the [facility] Heart Care Share Decision Support Tool to proceed with ICD at this time.  Risks, benefits, alternatives to ICD implantation were discussed in detail with the patient today. The patient  understands that the risks include but are not limited to bleeding, infection, pneumothorax, perforation, tamponade, vascular damage, renal failure, MI, stroke, death, inappropriate shocks, and lead dislodgement and he wishes to proceed.

## 2020-07-06 NOTE — Progress Notes (Signed)
Last 4 BP/ right arm now 72/47,  Called and spoke with Dr Ladona Ridgel, verbal order NS bolus 500 ml, stat ECHO plus cbc.

## 2020-07-06 NOTE — Progress Notes (Signed)
Called to see patient   He is s/p BiV ICD   Was doing well  Getting CXR today  In XRay he got diaphoretic, dizzy, N/, sweaty.   Transiently not responsive  Currently feelilng some better though a little weak  BP 120s/  HR 70s  On exam: Lungs are CTA  Cardiac RRR  No S3 Chst   ICD dressing dry  No hematoma Abd is benign Ext without edema  G Ladona Ridgel  Notified  He is coming in to see pt   Bedside echo Device rep contacted to interrogate     Plan to admit overnight for observation of BP/ HR    Dietrich Pates MD

## 2020-07-06 NOTE — Progress Notes (Signed)
EP Attending  Called about patient's bp drop to SBP of 52. He has been started on dopamine and a formal echo was obtained which demonstrates a 13 mm RV apical effusion. I suspect that this is the cause of his hypotension. We will need to revise his ICD lead, plus/minus pericardiocentesis.  Ryan Gowda Deshon Koslowski,MD

## 2020-07-06 NOTE — Progress Notes (Signed)
Repeat BP 54/     Limited echo ordered   Pericardial effusion present along free wall of RV   Most prominent at apex  13 mm   No RV collapse or RA collapse  Compared to echo images from 07/02/20 effusion is signficant   There was trivial effusion then   Pt currently getting IV fluids and DA for BP EP Ladona Ridgel aware)  Has reviewed images./   Dietrich Pates MD

## 2020-07-06 NOTE — Progress Notes (Signed)
EP Attending  Discussed with Dr. Tenny Craw. Patient became clammy then unresponsive on going to get his CXR. He feels well now. He has had his ICD interrogated and was working normally a couple of hours ago. He denies chest pain and his cxr is pending. His bedside echo demonstrates no significant effusion. He makes a poor echo window. As noted by Dr. Tenny Craw, he will be observed overnight. Plan DC home tomorrow.   Sharlot Gowda Laina Guerrieri,MD

## 2020-07-06 NOTE — Progress Notes (Signed)
Dr. Tenny Craw recommended overnight observation - have placed orders per our discussion. Neera Teng PA-C

## 2020-07-06 NOTE — Progress Notes (Signed)
Left chest pacer pocket dressing has slight oozing, no hematoma, no pain. I called and spoke with Mardelle Matte PA for EP. Pressure was held 10 minutes, I paced a slight pressure dressing on it and will continue to assess.

## 2020-07-06 NOTE — Progress Notes (Signed)
  Echocardiogram 2D Echocardiogram has been performed.  Delcie Roch 07/06/2020, 5:16 PM

## 2020-07-06 NOTE — Discharge Instructions (Signed)
After Your ICD (Implantable Cardiac Defibrillator)   . You have a St. Jude ICD  ACTIVITY . Do not lift your arm above shoulder height for 1 week after your procedure. After 7 days, you may progress as below.     Monday July 13, 2020  Tuesday July 14, 2020 Wednesday July 15, 2020 Thursday July 16, 2020   . Do not lift, push, pull, or carry anything over 10 pounds with the affected arm until 6 weeks (Monday August 17, 2020 ) after your procedure.   . Do NOT DRIVE until you have been seen for your wound check, or as long as instructed by your healthcare provider.   . Ask your healthcare provider when you can go back to work   INCISION/Dressing  . Monitor your defibrillator site for redness, swelling, and drainage. Call the device clinic at [phone] if you experience these symptoms or fever/chills.  . If your incision is sealed with Steri-strips or staples, you may shower 10 days after your procedure or when told by your provider. Do not remove the steri-strips or let the shower hit directly on your site. You may wash around your site with soap and water.    Marland Kitchen Avoid lotions, ointments, or perfumes over your incision until it is well-healed.  . You may use a hot tub or a pool AFTER your wound check appointment if the incision is completely closed.  . Your ICD is designed to protect you from life threatening heart rhythms. Because of this, you may receive a shock.   o 1 shock with no symptoms:  Call the office during business hours. o 1 shock with symptoms (chest pain, chest pressure, dizziness, lightheadedness, shortness of breath, overall feeling unwell):  Call 911. o If you experience 2 or more shocks in 24 hours:  Call 911. o If you receive a shock, you should not drive for 6 months per the [place] DMV IF you receive appropriate therapy from your ICD.   . ICD Alerts:  Some alerts are vibratory and others beep. These are NOT emergencies. Please call our office to let us  know. If this occurs at night or on weekends, it can wait until the next business day. Send a remote transmission.  . If your device is capable of reading fluid status (for heart failure), you will be offered monthly monitoring to review this with you.   DEVICE MANAGEMENT . Remote monitoring is used to monitor your ICD from home. This monitoring is scheduled every 91 days by our office. It allows [place] to keep an eye on the functioning of your device to ensure it is working properly. You will routinely see your Electrophysiologist annually (more often if necessary).   . You should receive your ID card for your new device in 4-8 weeks. Keep this card with you at all times once received. Consider wearing a medical alert bracelet or necklace.  . Your ICD  may be MRI compatible. This will be discussed at your next office visit/wound check.  You should avoid contact with strong electric or magnetic fields.    Do not use amateur (ham) radio equipment or electric (arc) welding torches. MP3 player headphones with magnets should not be used. Some devices are safe to use if held at least 12 inches (30 cm) from your defibrillator. These include power tools, lawn mowers, and speakers. If you are unsure if something is safe to use, ask your health care provider.   When using your cell phone, hold  it to the ear that is on the opposite side from the defibrillator. Do not leave your cell phone in a pocket over the defibrillator.   You may safely use electric blankets, heating pads, computers, and microwave ovens.  Call the office right away if:  You have chest pain.  You feel more than one shock.  You feel more short of breath than you have felt before.  You feel more light-headed than you have felt before.  Your incision starts to open up.  This information is not intended to replace advice given to you by your health care provider. Make sure you discuss any questions you have with your health care  provider.

## 2020-07-06 NOTE — Progress Notes (Signed)
MD Ladona Ridgel advised if patient's SBP starts to decrease and drops below 85-80 for night shift RN to page MD Ladona Ridgel.  Advised RN Terance Ice.

## 2020-07-07 ENCOUNTER — Inpatient Hospital Stay (HOSPITAL_COMMUNITY): Payer: Medicare Other

## 2020-07-07 ENCOUNTER — Encounter (HOSPITAL_COMMUNITY): Payer: Self-pay | Admitting: Internal Medicine

## 2020-07-07 ENCOUNTER — Other Ambulatory Visit: Payer: Self-pay

## 2020-07-07 DIAGNOSIS — I313 Pericardial effusion (noninflammatory): Secondary | ICD-10-CM | POA: Diagnosis not present

## 2020-07-07 DIAGNOSIS — I9581 Postprocedural hypotension: Secondary | ICD-10-CM | POA: Diagnosis not present

## 2020-07-07 LAB — BASIC METABOLIC PANEL
Anion gap: 13 (ref 5–15)
BUN: 24 mg/dL — ABNORMAL HIGH (ref 8–23)
CO2: 21 mmol/L — ABNORMAL LOW (ref 22–32)
Calcium: 8.6 mg/dL — ABNORMAL LOW (ref 8.9–10.3)
Chloride: 103 mmol/L (ref 98–111)
Creatinine, Ser: 1.18 mg/dL (ref 0.61–1.24)
GFR, Estimated: 60 mL/min — ABNORMAL LOW (ref >60)
Glucose, Bld: 284 mg/dL — ABNORMAL HIGH (ref 70–99)
Potassium: 4 mmol/L (ref 3.5–5.1)
Sodium: 137 mmol/L (ref 135–145)

## 2020-07-07 LAB — CBC
HCT: 39.6 % (ref 39.0–52.0)
Hemoglobin: 13.6 g/dL (ref 13.0–17.0)
MCH: 30.5 pg (ref 26.0–34.0)
MCHC: 34.3 g/dL (ref 30.0–36.0)
MCV: 88.8 fL (ref 80.0–100.0)
Platelets: 238 10*3/uL (ref 150–400)
RBC: 4.46 MIL/uL (ref 4.22–5.81)
RDW: 13.4 % (ref 11.5–15.5)
WBC: 12.1 10*3/uL — ABNORMAL HIGH (ref 4.0–10.5)
nRBC: 0 % (ref 0.0–0.2)

## 2020-07-07 LAB — GLUCOSE, CAPILLARY
Glucose-Capillary: 171 mg/dL — ABNORMAL HIGH (ref 70–99)
Glucose-Capillary: 112 mg/dL — ABNORMAL HIGH (ref 70–99)
Glucose-Capillary: 218 mg/dL — ABNORMAL HIGH (ref 70–99)

## 2020-07-07 LAB — ECHOCARDIOGRAM LIMITED
Height: 74 in
S' Lateral: 4.63 cm
Weight: 3056 oz

## 2020-07-07 MED ORDER — INFLUENZA VAC A&B SA ADJ QUAD 0.5 ML IM PRSY
0.5000 mL | PREFILLED_SYRINGE | INTRAMUSCULAR | Status: AC
  Administered 2020-07-08: 0.5 mL via INTRAMUSCULAR
  Filled 2020-07-07: qty 0.5

## 2020-07-07 MED ORDER — INSULIN ASPART 100 UNIT/ML ~~LOC~~ SOLN
0.0000 [IU] | Freq: Three times a day (TID) | SUBCUTANEOUS | Status: DC
  Administered 2020-07-07: 2 [IU] via SUBCUTANEOUS
  Administered 2020-07-08: 3 [IU] via SUBCUTANEOUS

## 2020-07-07 MED ORDER — CYANOCOBALAMIN 1000 MCG/ML IJ SOLN
1000.0000 ug | Freq: Once | INTRAMUSCULAR | Status: AC
  Administered 2020-07-07: 1000 ug via INTRAMUSCULAR
  Filled 2020-07-07: qty 1

## 2020-07-07 MED ORDER — INSULIN ASPART 100 UNIT/ML ~~LOC~~ SOLN
0.0000 [IU] | Freq: Every day | SUBCUTANEOUS | Status: DC
  Administered 2020-07-07: 2 [IU] via SUBCUTANEOUS

## 2020-07-07 MED FILL — Lidocaine HCl Local Inj 1%: INTRAMUSCULAR | Qty: 120 | Status: AC

## 2020-07-07 NOTE — Progress Notes (Signed)
  Echocardiogram 2D Echocardiogram has been performed.  Nana Hoselton G Travelle Mcclimans 07/07/2020, 9:46 AM

## 2020-07-07 NOTE — Progress Notes (Addendum)
Electrophysiology Rounding Note  Patient Name: Ryan [name] Date of Encounter: 07/07/2020  Primary Cardiologist: Armanda Magic, MD Electrophysiologist: Lewayne Bunting, MD   Subjective   S/p St Jude CRT-D implantation 10/18 complicated by RV lead macroperforation with hemodynamically important pericardial effusion.  S/p RV lead revision 10/18  CXR 10/18 2131 showed stable lead positioning.  The patient is doing well today.  BP ~90-110s. At this time, the patient denies chest pain, shortness of breath, or any new concerns.  Inpatient Medications    Scheduled Meds:  aspirin EC  81 mg Oral Daily   atorvastatin  80 mg Oral QHS   Chlorhexidine Gluconate Cloth  6 each Topical Daily   Chlorhexidine Gluconate Cloth  6 each Topical Daily   furosemide  20 mg Oral Daily   gabapentin  300 mg Oral TID   PARoxetine  40 mg Oral [id number]   sacubitril-valsartan  1 tablet Oral BID   sodium chloride flush  3 mL Intravenous Q12H   spironolactone  12.5 mg Oral Daily   Continuous Infusions:  sodium chloride 10 mL/hr at 07/06/20 2200   DOPamine 10 mcg/kg/min (07/06/20 2200)   PRN Meds: sodium chloride, acetaminophen, ondansetron (ZOFRAN) IV, sodium chloride flush, zolpidem   Vital Signs    Vitals:   07/06/20 2315 07/06/20 2330 07/06/20 2345 07/07/20 0000  BP: 110/74 93/69 91/60  98/64  Pulse: 72 74 77 75  Resp: 15 13 14 13   Temp:      TempSrc:      SpO2: 95% 93% 94% 94%  Weight:      Height:        Intake/Output Summary (Last 24 hours) at 07/07/2020 0714 Last data filed at 07/06/2020 2200 Gross per 24 hour  Intake 101.9 ml  Output 325 ml  Net -223.1 ml   Filed Weights   07/06/20 0540  Weight: 86.6 kg    Physical Exam    GEN- The patient is well appearing, alert and oriented x 3 today.   Head- normocephalic, atraumatic Eyes-  Sclera clear, conjunctiva pink Ears- hearing intact Oropharynx- clear Neck- supple Lungs- Clear to ausculation bilaterally, normal work of  breathing Heart- Regular rate and rhythm, no murmurs, rubs or gallops GI- soft, NT, ND, + BS Extremities- no clubbing or cyanosis. No edema Skin- no rash or lesion Psych- euthymic mood, full affect Neuro- strength and sensation are intact  Labs    CBC Recent Labs    07/06/20 1650 07/07/20 0019  WBC 10.5 12.1*  HGB 12.7* 13.6  HCT 38.1* 39.6  MCV 89.6 88.8  PLT 198 238   Basic Metabolic Panel Recent Labs    07/09/20 0019  NA 137  K 4.0  CL 103  CO2 21*  GLUCOSE 284*  BUN 24*  CREATININE 1.18  CALCIUM 8.6*   Liver Function Tests No results for input(s): AST, ALT, ALKPHOS, BILITOT, PROT, ALBUMIN in the last 72 hours. No results for input(s): LIPASE, AMYLASE in the last 72 hours. Cardiac Enzymes No results for input(s): CKTOTAL, CKMB, CKMBINDEX, TROPONINI in the last 72 hours.   Telemetry    NSR 70s (personally reviewed)  Radiology    EP PPM/ICD IMPLANT  Result Date: 07/06/2020 Conclusion: Successful ICD lead revision in a patient with hemodynamically important pericardial effusion post initial ICD implant.  The patient will be observed in the ICU overnight for careful observation. 07/08/2020, MD  EP PPM/ICD IMPLANT  Result Date: 07/06/2020 CONCLUSIONS:  1. 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  2. Successful biventricular ICD implantation.  3. DFT less than or equal to 20 joules.  4. No early apparent complications. Lewayne Bunting, MD 9:32 AM 07/06/2020   DG Chest Port 1 View  Result Date: 07/06/2020 CLINICAL DATA:  Recent episode of hypotension with planned ICD lead revision. EXAM: PORTABLE CHEST 1 VIEW COMPARISON:  March 30, 2019 FINDINGS: A dual lead AICD is noted. This represents a new finding when compared to the prior study. There is no evidence of acute infiltrate, pleural effusion or pneumothorax. The heart size and mediastinal contours are within normal limits. The  visualized skeletal structures are unremarkable. IMPRESSION: No active disease. Electronically Signed   By: Aram Candela M.D.   On: 07/06/2020 21:31   ECHOCARDIOGRAM LIMITED  Result Date: 07/06/2020    ECHOCARDIOGRAM LIMITED REPORT   Patient Name:   Ryan [name] Date of Exam: 07/06/2020 Medical Rec #:  [id number]           Height:       74.0 in Accession #:    [id number]          Weight:       191.0 lb Date of Birth:  1941/06/17           BSA:          2.131 m Patient Age:    78 years            BP:           161/77 mmHg Patient Gender: M                   HR:           64 bpm. Exam Location:  Inpatient Procedure: Limited Echo, Limited Color Doppler and Cardiac Doppler STAT ECHO Indications:    pericardial effusion  History:        Patient has prior history of Echocardiogram examinations, most                 recent 07/02/2020. Cardiomyopathy, CAD, Signs/Symptoms:Syncope;                 Risk Factors:Dyslipidemia and Hypertension.  Sonographer:    Delcie Roch Referring Phys: [id number] PAULA V ROSS IMPRESSIONS  1. Limited study to evaluate pericardial effusion; severe global reduction in LV systolic function; small pericardial effusion. Effusion new since 07/02/20. FINDINGS  Additional Comments: Limited study to evaluate pericardial effusion; severe global reduction in LV systolic function; small pericardial effusion. Effusion new since 07/02/20. Olga Millers MD Electronically signed by Olga Millers MD Signature Date/Time: 07/06/2020/5:35:46 PM    Final     Patient Profile     Ryan [name] is a 79 y.o. male with a past medical history significant for chronic systolic CHF, LBBB/IVCD, severe ischemic CMP, and syncope.  he was admitted for ICD implantation that was complicated by RV lead perforation..   Assessment & Plan    1. RV lead macroperforation -> pericardial effusion S/p lead revision 10/18 Stable this am. Repeat limited echo to re-assess effusion   2. Chronic systolic  heart failure S/p St Jude ICD CRT-D but with RV lead  Stable on appropriate medical regimen.  3. Hypotension Wean dopamine as tolerated.  Will hold entresto today to help facilitate.   For questions or updates, please contact [facility] Please consult [email or web address] for contact info under Cardiology/STEMI.  Signed, Graciella Freer, PA-C  07/07/2020, 7:14 AM  EP Attending  Patient seen and examined. Agree with above. He is doing well this morining. We will hold his entresto. We will wean off of the dopamine. If vitals are stable, he can be transferred to tele this afternoon. We will repeat the 2D echo to be sure that the effusion remains stable.   Sharlot Gowda Loye Reininger,MD

## 2020-07-08 ENCOUNTER — Ambulatory Visit: Payer: Medicare Other | Admitting: Internal Medicine

## 2020-07-08 LAB — CBC WITH DIFFERENTIAL/PLATELET
Abs Immature Granulocytes: 0.02 10*3/uL (ref 0.00–0.07)
Basophils Absolute: 0 10*3/uL (ref 0.0–0.1)
Basophils Relative: 1 %
Eosinophils Absolute: 0.2 10*3/uL (ref 0.0–0.5)
Eosinophils Relative: 3 %
HCT: 34.8 % — ABNORMAL LOW (ref 39.0–52.0)
Hemoglobin: 12 g/dL — ABNORMAL LOW (ref 13.0–17.0)
Immature Granulocytes: 0 %
Lymphocytes Relative: 21 %
Lymphs Abs: 1.5 10*3/uL (ref 0.7–4.0)
MCH: 30.7 pg (ref 26.0–34.0)
MCHC: 34.5 g/dL (ref 30.0–36.0)
MCV: 89 fL (ref 80.0–100.0)
Monocytes Absolute: 0.8 10*3/uL (ref 0.1–1.0)
Monocytes Relative: 11 %
Neutro Abs: 4.3 10*3/uL (ref 1.7–7.7)
Neutrophils Relative %: 64 %
Platelets: 185 10*3/uL (ref 150–400)
RBC: 3.91 MIL/uL — ABNORMAL LOW (ref 4.22–5.81)
RDW: 13.6 % (ref 11.5–15.5)
WBC: 6.8 10*3/uL (ref 4.0–10.5)
nRBC: 0 % (ref 0.0–0.2)

## 2020-07-08 LAB — BASIC METABOLIC PANEL
Anion gap: 8 (ref 5–15)
BUN: 22 mg/dL (ref 8–23)
CO2: 24 mmol/L (ref 22–32)
Calcium: 8.7 mg/dL — ABNORMAL LOW (ref 8.9–10.3)
Chloride: 107 mmol/L (ref 98–111)
Creatinine, Ser: 1.06 mg/dL (ref 0.61–1.24)
GFR, Estimated: >60 mL/min (ref >60)
Glucose, Bld: 133 mg/dL — ABNORMAL HIGH (ref 70–99)
Potassium: 3.8 mmol/L (ref 3.5–5.1)
Sodium: 139 mmol/L (ref 135–145)

## 2020-07-08 LAB — GLUCOSE, CAPILLARY: Glucose-Capillary: 216 mg/dL — ABNORMAL HIGH (ref 70–99)

## 2020-07-08 MED ORDER — METOPROLOL SUCCINATE ER 25 MG PO TB24: 25.0000 mg | ORAL_TABLET | Freq: Every day | ORAL | 3 refills | Status: DC

## 2020-07-08 MED ORDER — ACETAMINOPHEN 325 MG PO TABS
325.0000 mg | ORAL_TABLET | ORAL | Status: DC | PRN
Start: 2020-07-08 — End: 2023-09-15

## 2020-07-08 MED ORDER — METOPROLOL SUCCINATE ER 25 MG PO TB24
25.0000 mg | ORAL_TABLET | Freq: Every day | ORAL | Status: DC
  Administered 2020-07-08: 25 mg via ORAL
  Filled 2020-07-08: qty 1

## 2020-07-08 NOTE — Discharge Summary (Addendum)
ELECTROPHYSIOLOGY PROCEDURE DISCHARGE SUMMARY    Patient ID: Ryan [name],  MRN: [id number], DOB/AGE: 1941/08/15 79 y.o.  Admit date: 07/06/2020 Discharge date: 07/08/2020  Primary Care Physician: Isaac Bliss, Rayford Halsted, MD  Primary Cardiologist: Fransico Him, MD  Electrophysiologist: Dr. Lovena Le  Primary Diagnosis:  Chronic systolic CHF   Secondary Diagnosis: RV lead macroperforation Pericardial effusion Hypotension  Allergies  Allergen Reactions  . Sulfa Antibiotics     Blood in Urine     Procedures This Admission:  1.  Implantation of a St. Jude BiV ICD on 07/06/2020 by Dr. Lovena Le.  The patient received a St Jude model number [id number] ICD with model number [id number] right atrial lead, [id number] right ventricular lead, and [id number] left ventricular lead.  DFT's were successful at < 20 J.  No early apparent complications. 2. Limited echo 10/18 with 13 mm RV apical effusion noted 3. Lead revision of RV lead using same [id number] RV lead 07/06/2020 4.  CXR on 07/06/20 (post revision) demonstrated no pneumothorax status post device implantation and lead revision 5. Repeat limited echo 10/19 with stabilization/improvement of effusion post lead revision.   Brief HPI: Ryan [name] is a 79 y.o. male was referred to electrophysiology in the outpatient setting  for consideration of ICD implantation.  Past medical history includes above.  The patient has persistent LV dysfunction despite guideline directed therapy.  Risks, benefits, and alternatives to ICD implantation were reviewed with the patient who wished to proceed.   Hospital Course:  The patient was admitted and underwent implantation of a St. Jude BiV ICD with details as outlined above.    Pt became clammy and unresponsive upon presentation to CXR. ICD interrogation had been normal. He denies chest pain. BEDSIDE echo showed no significant effusion.  Pt continue to have hypotension. Formal echo showed 13 mm  RV apical effusion. Pt taken urgently for lead revision. Pt placed on dopamine and weaned off into the next day. They were monitored on telemetry overnight which demonstrated stable rhythm.  Left chest was without hematoma or ecchymosis.    The device was interrogated and found to be functioning normally post revision. Repeat limited echo 10/19 showed stabilization/improvement of effusion post lead revision. CXR post revision was obtained and demonstrated no pneumothorax status post device implantation.  Wound care, arm mobility, and restrictions were reviewed with the patient personally on day of discharge. All questions answered.  The patient was examined and considered stable for discharge to home.   The patient's discharge medications include an ACE-I/ARB/ARNI Delene Loll) and beta blocker (Toprol added this admission s/p CRT).   Physical Exam: Vitals:   07/07/20 2025 07/08/20 0034 07/08/20 0445 07/08/20 0800  BP: 113/70 124/83 125/85   Pulse: 72 62 69 68  Resp: '16 18 15   ' Temp: 98 F (36.7 C) 97.6 F (36.4 C) 97.6 F (36.4 C)   TempSrc: Oral Oral Oral   SpO2: 100% 98% 98% 97%  Weight:   89.5 kg   Height:        GEN- The patient is well appearing, alert and oriented x 3 today.   HEENT: normocephalic, atraumatic; sclera clear, conjunctiva pink; hearing intact; oropharynx clear; neck supple, no JVP Lymph- no cervical lymphadenopathy Lungs- Clear to ausculation bilaterally, normal work of breathing.  No wheezes, rales, rhonchi Heart- Regular rate and rhythm, no murmurs, rubs or gallops, PMI not laterally displaced GI- soft, non-tender, non-distended, bowel sounds present, no hepatosplenomegaly Extremities- no clubbing, cyanosis, or edema; DP/PT/radial pulses 2+  bilaterally MS- no significant deformity or atrophy Skin- warm and dry, no rash or lesion. ICD site stable on day of discharge. Psych- euthymic mood, full affect Neuro- strength and sensation are intact  Labs:   Lab Results    Component Value Date   WBC 6.8 07/08/2020   HGB 12.0 (L) 07/08/2020   HCT 34.8 (L) 07/08/2020   MCV 89.0 07/08/2020   PLT 185 07/08/2020    Recent Labs  Lab 07/08/20 0243  NA 139  K 3.8  CL 107  CO2 24  BUN 22  CREATININE 1.06  CALCIUM 8.7*  GLUCOSE 133*    Discharge Medications:  Allergies as of 07/08/2020      Reactions   Sulfa Antibiotics    Blood in Urine      Medication List    TAKE these medications   acetaminophen 325 MG tablet Commonly known as: TYLENOL Take 1-2 tablets (325-650 mg total) by mouth every 4 (four) hours as needed for mild pain.   aspirin EC 81 MG tablet Take 81 mg by mouth daily. Swallow whole.   atorvastatin 80 MG tablet Commonly known as: LIPITOR Take 1 tablet (80 mg total) by mouth at bedtime.   BD SafetyGlide Syringe/Needle 25G X 1" 3 ML Misc Generic drug: SYRINGE-NEEDLE (DISP) 3 ML Use for B12 injections   blood glucose meter kit and supplies Kit Dispense based on patient and insurance preference. Use up to four times daily as directed. (FOR ICD-9 250.00, 250.01).   cyanocobalamin 1000 MCG/ML injection Commonly known as: (VITAMIN B-12) inject 1 ml once a month What changed:   how much to take  how to take this  when to take this  additional instructions   Entresto 97-103 MG Generic drug: sacubitril-valsartan Take 1 tablet by mouth 2 (two) times daily.   furosemide 20 MG tablet Commonly known as: Lasix Take 1 tablet (20 mg total) by mouth daily.   gabapentin 300 MG capsule Commonly known as: NEURONTIN TAKE ONE CAPSULE BY MOUTH THREE TIMES A DAY What changed:   how much to take  when to take this   glimepiride 2 MG tablet Commonly known as: AMARYL Take 1 tablet (2 mg total) by mouth 2 (two) times daily.   metFORMIN 1000 MG tablet Commonly known as: GLUCOPHAGE Take 1 tablet (1,000 mg total) by mouth 2 (two) times daily with a meal.   metoprolol succinate 25 MG 24 hr tablet Commonly known as:  TOPROL-XL Take 1 tablet (25 mg total) by mouth daily. Start taking on: July 09, 1496   OneTouch Delica Plus [id number] Misc Apply 1 each topically 4 (four) times daily.   OneTouch Verio test strip Generic drug: glucose blood 1 each 4 (four) times daily.   PARoxetine 40 MG tablet Commonly known as: PAXIL Take 1 tablet (40 mg total) by mouth every morning.   Potassium Chloride ER 20 MEQ Tbcr Take 20 mEq by mouth daily.   spironolactone 25 MG tablet Commonly known as: ALDACTONE Take 0.5 tablets (12.5 mg total) by mouth daily.   zolpidem 5 MG tablet Commonly known as: AMBIEN Take 1 tablet (5 mg total) by mouth at bedtime as needed for sleep.       Disposition:    Follow-up Information    Evans Lance, MD Follow up on 10/08/2020.   Specialty: Cardiology Why: at 1345 for 3 month ICD check.   Contact information: [address] [place] [address] [phone]  [facility] Follow up on 07/16/2020.   Why: at 1120 for post ICD wound check Contact information: [place] [address] [phone]              Duration of Discharge Encounter: Greater than 30 minutes including physician time.  Jacalyn Lefevre, PA-C  07/08/2020 9:41 AM  EP Attending  Patient seen and examined. Agree with the findings as noted above. The patient is asymptomatic, off of pressors and is hemodynamically stable. He will be discharged home with the usual followup. As he is asymptomatic, I will not prescribe NSAIDS or colchicine.   Carleene Overlie Asees Manfredi,MD

## 2020-07-13 NOTE — Progress Notes (Signed)
Patient ID: Ryan [name]                 DOB: August 07, 1941                      MRN: [id number]     HPI: Ryan [name] is a 79 y.o. male referred by Dr. Radford Pax to to pharmacy clinic for HF medication management. PMH is significant for chronic systolic HF, left bundle branch block/IVCD, HTN, HLD, DM, anxiety, and syncopy (hospitalized 03/29/20). Most recent LVEF 20-25%, 25-30% by echo on 10/14, 10/18 respectively. He is currently wearing LifeVest, and has not been placed on a beta blocker due to bradycardia. ICD (St Jude CRT-D) was implanted 07/06/20 w/ post-surgical hypotensive & unresponsive event d/t RV apical effusion which has since recovered.   The patient was initiated on Entresto 24-78m BID 05/22/20 which was increased to 49-546m9/20/21 which he tolerated with BP readings 120-130/70-72 & improvements in exercise. As such, his EnDelene Lollas further titrated to 97-10343mID 06/22/20. The patient was initiated on metoprolol succinate 37m9mily 07/08/20 post CRT implant.  Of note, his EntrDelene Lollobtained using patient assistance. At the time of last visit, he had run out of his home supply so was provided a sample to bridge him to refill.   Patient presents to clinic in good spirits today, but states that he has been feeling weak since his ICD was placed. He is mainly complaining of arm pain & presents with a large hematoma on his left arm (wound exam scheduled 10/28). The patient states that he was told that he had lost a significant amount of blood during his ICD placement which he was told is causing him to get dizzy when standing. He does endorse significant lightheadedness upon standing (which happened today when being called from waiting room). He reports that his BP has been really good (primarily 120s & 130s w/ a few readings in the 80s). Blood sugar has been 125-152. Of note, the patient stated that he was not sure if he was taking his Entreso once or twice daily. Patient also  confirmed that he is not taking his potassium chloride.   Current CHF meds: Entresto 97-103 mg twice daily. Spironolactone 12.5 mg daily. Toprolol 37mg20mly. Lasix 20mg 23my  Previously tried: Losartan 50 mg daily (switched to Entresto) BP goal:  <130/80  Family History: Diabetes (mother)  Social History: Never smoker. No alcohol. Sleeps 8-10 hours a night and usually wakes up for the bathroom once during the night.   Diet: No changes recently, still trying to cut out salt from diet Patient's son is a gourmeLawyerelps him meal prep.  Breakfast: 2 eggs, small piece of chicken or pork, gritz or cereal, water or coffee (1-2 cups occasionally) or iced tea  Lunch: baked chicken, salad, boiled eggs Dinner: baked chicken (boiled or baked), broccoli, carrots Snacks: Lance crackers, trail mix  [place] Drinks water, soft drink once in while Does not add salt to his food. Avoids fried foods.   Exercise: Decreased exercise since ICD placement: walks 35 minutes daily which has decreased from 60 min daily.   Home BP readings: patient does not have home BP readings today Per memory: 120-130s w/ a few SBP in 80s,  HR 65-80  Wt Readings from Last 3 Encounters:  07/08/20 197 lb 4.8 oz (89.5 kg)  05/28/20 198 lb (89.8 kg)  05/22/20 200 lb (90.7  kg)   BP Readings from Last 3 Encounters:  07/08/20 125/85  06/08/20 118/78  05/28/20 130/82   Pulse Readings from Last 3 Encounters:  07/08/20 68  06/08/20 82  05/28/20 69   Labs: 07/03/20 A1C 7.3 (down from 10.4)  07/08/20: K3.8, Scr. 1.06  Renal function: Estimated Creatinine Clearance: 68.9 mL/min (by C-G formula based on SCr of 1.06 mg/dL).  Past Medical History:  Diagnosis Date  . 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 B12 deficiency   . BPH (benign prostatic hyperplasia)   . CAD (coronary artery disease), native coronary artery    cardiac cath with  three-vessel CAD no targets for PCI and turned down for CABG,  . DM (diabetes mellitus), type 2, uncontrolled w/neurologic complication ([place])   . GAD (generalized anxiety disorder)   . HOCM (hypertrophic obstructive cardiomyopathy) ([place])    dx by cardiac MRI  . HTN (hypertension)   . Hyperlipidemia   . Ischemic cardiomyopathy    EF 20-25% felt mixed etiology nonischemic/ischemic  . Neuropathy, peripheral     Current Outpatient Medications on File Prior to Visit  Medication Sig Dispense Refill  . acetaminophen (TYLENOL) 325 MG tablet Take 1-2 tablets (325-650 mg total) by mouth every 4 (four) hours as needed for mild pain.    Marland Kitchen aspirin EC 81 MG tablet Take 81 mg by mouth daily. Swallow whole.    Marland Kitchen atorvastatin (LIPITOR) 80 MG tablet Take 1 tablet (80 mg total) by mouth at bedtime. 90 tablet 3  . blood glucose meter kit and supplies KIT Dispense based on patient and insurance preference. Use up to four times daily as directed. (FOR ICD-9 250.00, 250.01). 1 each 0  . cyanocobalamin (,VITAMIN B-12,) 1000 MCG/ML injection inject 1 ml once a month (Patient taking differently: Inject 1,000 mcg into the muscle every 30 (thirty) days. ) 6 mL 3  . furosemide (LASIX) 20 MG tablet Take 1 tablet (20 mg total) by mouth daily. 30 tablet 1  . gabapentin (NEURONTIN) 300 MG capsule TAKE ONE CAPSULE BY MOUTH THREE TIMES A DAY (Patient taking differently: Take 900 mg by mouth at bedtime. ) 270 capsule 2  . glimepiride (AMARYL) 2 MG tablet Take 1 tablet (2 mg total) by mouth 2 (two) times daily. 60 tablet 1  . Lancets (ONETOUCH DELICA PLUS [id number]) MISC Apply 1 each topically 4 (four) times daily.    . metFORMIN (GLUCOPHAGE) 1000 MG tablet Take 1 tablet (1,000 mg total) by mouth 2 (two) times daily with a meal. 60 tablet 1  . metoprolol succinate (TOPROL-XL) 25 MG 24 hr tablet Take 1 tablet (25 mg total) by mouth daily. 90 tablet 3  . ONETOUCH VERIO test strip 1 each 4 (four) times daily.    Marland Kitchen PARoxetine  (PAXIL) 40 MG tablet Take 1 tablet (40 mg total) by mouth every morning. 90 tablet 1  . potassium chloride 20 MEQ TBCR Take 20 mEq by mouth daily. (Patient not taking: Reported on 06/26/2020) 30 tablet 1  . sacubitril-valsartan (ENTRESTO) 97-103 MG Take 1 tablet by mouth 2 (two) times daily. 180 tablet 3  . spironolactone (ALDACTONE) 25 MG tablet Take 0.5 tablets (12.5 mg total) by mouth daily. 45 tablet 3  . SYRINGE-NEEDLE, DISP, 3 ML (BD SAFETYGLIDE SYRINGE/NEEDLE) 25G X 1" 3 ML MISC Use for B12 injections 100 each 11  . zolpidem (AMBIEN) 5 MG tablet Take 1 tablet (5 mg total) by mouth at bedtime as needed for sleep. (Patient not taking: Reported on 06/26/2020) 30  tablet 1   No current facility-administered medications on file prior to visit.    Allergies  Allergen Reactions  . Sulfa Antibiotics     Blood in Urine   Vitals today: BP 124/70  HR 62 SPO2 96%   Assessment/Plan:  1. Hypertension -  Pt presents today with BP that is to goal (<130/80). The patient does endorse dizziness upon standing that is likely attributed to the patient's recent blood loss secondary to ICD placement vs orthostatic hypotension induced by current medications. With the patient's lack of home BP readings, unable to fully confirm that the patient is not getting hypotensive at home. Future medication changes to achieve GDMT HFrEF therapy for this patient include metoprolol titration, spironolactone titration, and initiation of an SGLT2i. As the patient was initiated on metoprolol recently, will defer titration to future visits. Given patient's most recent renal and potassium levels indicating tolerance of spironolactone with spironolactone's tendency to not affect BP greatly at 68m daily,  will further increase spironolactone today to 252mdaily. Given patient's comorbid uncontrolled diabetes w/o contraindications to SGLT2i, the patient is an ideal candidate for initiation of empagliflozin or dapagliflozin. Given the  patient's medicare insurance status and need for patient assistance to afford, had patient fill our patient assistance paperwork today for both SGLT2is and requested that the patient bring in social security statement as proof of income at next visit in order to apply.  Will get repeat BMET in 1wk to assess Scr & K after spironolactone titration w/ follow-up appointment scheduled for 08/04/2020 (3wks). Requested patient maintain home BP log and bring this with him during next visit.   Thank you,  AbOtho DarnerPharmacy Student Class of [address], BCPS, CPP [id number]. [address] StreetGrWrightsboro[place] [address]Phone: [phone]: [phone]

## 2020-07-14 ENCOUNTER — Ambulatory Visit (INDEPENDENT_AMBULATORY_CARE_PROVIDER_SITE_OTHER): Payer: Medicare Other | Admitting: Pharmacist

## 2020-07-14 ENCOUNTER — Other Ambulatory Visit: Payer: Self-pay

## 2020-07-14 VITALS — BP 124/70 | HR 62

## 2020-07-14 DIAGNOSIS — I5021 Acute systolic (congestive) heart failure: Secondary | ICD-10-CM | POA: Diagnosis not present

## 2020-07-14 MED ORDER — SPIRONOLACTONE 25 MG PO TABS: 25.0000 mg | ORAL_TABLET | Freq: Every day | ORAL | 3 refills | Status: DC

## 2020-07-14 NOTE — Patient Instructions (Addendum)
Thank you for seeing [place] today!  Your blood pressure is doing great today. It sounds like your dizziness when standing is from the blood loss that you experienced during your ICD placement. If that does not resolve, or gets worse, please let us know.   We would like to start you on empagliflozin or dapagliflozin in the future which can help with your diabetes and you heart. Today, you filled out the prior authorization forms that we will use to have these medications covered. This is something that we will start in the future.  Today, I would like you to:  INCREASE your spironolactone to 25mg  (1 tablet) daily CONTINUE metoprolol 25mg  daily and Entresto 97-103 mg twice daily   We would like to get your labs drawn in 1 week, November 2nd anytime after 07:30 AM.   Thank you! Abby

## 2020-07-16 ENCOUNTER — Ambulatory Visit (INDEPENDENT_AMBULATORY_CARE_PROVIDER_SITE_OTHER): Payer: Medicare Other | Admitting: Emergency Medicine

## 2020-07-16 ENCOUNTER — Other Ambulatory Visit: Payer: Self-pay

## 2020-07-16 DIAGNOSIS — R55 Syncope and collapse: Secondary | ICD-10-CM

## 2020-07-16 DIAGNOSIS — I255 Ischemic cardiomyopathy: Secondary | ICD-10-CM | POA: Diagnosis not present

## 2020-07-16 LAB — CUP PACEART INCLINIC DEVICE CHECK
Battery Remaining Longevity: 58 mo
Brady Statistic RA Percent Paced: 59 %
Brady Statistic RV Percent Paced: 99 %
Date Time Interrogation Session: 20211028115943
HighPow Impedance: 61.875
Implantable Lead Implant Date: 20211018
Implantable Lead Implant Date: 20211018
Implantable Lead Implant Date: 20211018
Implantable Lead Location: 753858
Implantable Lead Location: 753859
Implantable Lead Location: 753860
Implantable Lead Model: 7122
Implantable Pulse Generator Implant Date: 20211018
Lead Channel Impedance Value: 475 Ohm
Lead Channel Impedance Value: 512.5 Ohm
Lead Channel Impedance Value: 562.5 Ohm
Lead Channel Pacing Threshold Amplitude: 0.5 V
Lead Channel Pacing Threshold Amplitude: 0.625 V
Lead Channel Pacing Threshold Amplitude: 1 V
Lead Channel Pacing Threshold Pulse Width: 0.5 ms
Lead Channel Pacing Threshold Pulse Width: 0.5 ms
Lead Channel Pacing Threshold Pulse Width: 0.5 ms
Lead Channel Sensing Intrinsic Amplitude: 11.7 mV
Lead Channel Sensing Intrinsic Amplitude: 4 mV
Lead Channel Setting Pacing Amplitude: 2 V
Lead Channel Setting Pacing Amplitude: 3.5 V
Lead Channel Setting Pacing Amplitude: 3.5 V
Lead Channel Setting Pacing Pulse Width: 0.5 ms
Lead Channel Setting Pacing Pulse Width: 0.5 ms
Lead Channel Setting Sensing Sensitivity: 0.5 mV
Pulse Gen Serial Number: 9949330

## 2020-07-16 NOTE — Progress Notes (Signed)
Wound check appointment. Steri-strips removed. Stitch present at distal end of incision and clipped. Wound without redness or edema. Incision edges approximated, wound well healed. Normal device function. Thresholds, sensing, and impedances consistent with implant measurements. Device programmed at 3.5V for extra safety margin until 3 month visit. Histogram distribution appropriate for patient and level of activity. No mode switches or ventricular arrhythmias noted. Patient educated about wound care, arm mobility, lifting restrictions, shock plan. ROV in 3 months with implanting physician on 10/09/19 with Dr Ladona Ridgel. Enrolled in remote monitoring and next remote on 10/06/19.

## 2020-07-16 NOTE — Patient Instructions (Signed)
Call the office when you get home to send a remote transmission.Call the office if you have any swelling, redness, drainage or bleeding from wound site. Device Clinic: [phone]

## 2020-07-21 NOTE — Progress Notes (Signed)
Cardiology Office Note    Date:  07/22/2020   ID:  Ryan [name], Ryan [name] [birth year], MRN [id number]  PCP:  Philip Aspen, Limmie Patricia, MD  Cardiologist: Armanda Magic, MD EPS: Lewayne Bunting, MD  Chief Complaint  Patient presents with  . Follow-up    syncope, HTN, HLD    History of Present Illness:  Ryan [name] is a 79 y.o. male with history of hypertension, HLD, DM, anxiety and  syncope that was likely orthostatic in origin as it was in a hot car and walking into the grocery store.  He was found to have severe LV dysfunction so arrhythmia was a possibility as well.  EF 20 to 25% by echo, cardiac cath with three-vessel CAD no targets for PCI and turned down for CABG,   Cardiac MRI showed HOCM but also concern for amyloid with a late dense mid wall LGE in the basal to mid septum and mid inferior septum 17 mm in thickness.  PYP scan was equivocal for amyloid.  SPEP and UPEP were negative.  Given HOCM diagnosis and syncope as well as LV dysfunction a Lifevest was placed with referral EP as he would be a candidate for CRT-D given LBBB and ischemic DCM.   He underwent St. Jude BiV ICD on 07/06/2020 complicated by microperforation with small apical pericardial effusion and lead revision of RV lead.    He is here today for followup and is doing well.  He denies any chest pain or pressure, SOB, DOE, PND, orthopnea, LE edema, dizziness (except when getting up too fast), palpitations or syncope. He is compliant with his meds and is tolerating meds with no SE.      Past Medical History:  Diagnosis Date  . 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 B12 deficiency   . BPH (benign prostatic hyperplasia)   . CAD (coronary artery disease), native coronary artery    cardiac cath with three-vessel CAD no targets for PCI and turned down for CABG,  . DM (diabetes mellitus), type 2, uncontrolled w/neurologic complication (HCC)   . GAD (generalized anxiety disorder)   .  HOCM (hypertrophic obstructive cardiomyopathy) (HCC)    dx by cardiac MRI  . HTN (hypertension)   . Hyperlipidemia   . Ischemic cardiomyopathy    EF 20-25% felt mixed etiology nonischemic/ischemic  . Neuropathy, peripheral     Past Surgical History:  Procedure Laterality Date  . BIV ICD INSERTION CRT-D N/A 07/06/2020   Procedure: BIV ICD INSERTION CRT-D;  Surgeon: Marinus Maw, MD;  Location: [facility] INVASIVE CV LAB;  Service: Cardiovascular;  Laterality: N/A;  . LEAD REVISION/REPAIR N/A 07/06/2020   Procedure: LEAD REVISION/REPAIR;  Surgeon: Marinus Maw, MD;  Location: [facility] INVASIVE CV LAB;  Service: Cardiovascular;  Laterality: N/A;  . RIGHT/LEFT HEART CATH AND CORONARY ANGIOGRAPHY N/A 03/31/2020   Procedure: RIGHT/LEFT HEART CATH AND CORONARY ANGIOGRAPHY;  Surgeon: Lyn Records, MD;  Location: [facility] INVASIVE CV LAB;  Service: Cardiovascular;  Laterality: N/A;    Current Medications: Current Meds  Medication Sig  . acetaminophen (TYLENOL) 325 MG tablet Take 1-2 tablets (325-650 mg total) by mouth every 4 (four) hours as needed for mild pain.  Marland Kitchen aspirin EC 81 MG tablet Take 81 mg by mouth daily. Swallow whole.  Marland Kitchen atorvastatin (LIPITOR) 80 MG tablet Take 1 tablet (80 mg total) by mouth at bedtime.  . cyanocobalamin (,VITAMIN B-12,) 1000 MCG/ML injection inject 1 ml once a month (Patient taking differently: Inject 1,000 mcg into the  muscle every 30 (thirty) days. )  . furosemide (LASIX) 20 MG tablet Take 1 tablet (20 mg total) by mouth daily.  Marland Kitchen gabapentin (NEURONTIN) 300 MG capsule TAKE ONE CAPSULE BY MOUTH THREE TIMES A DAY (Patient taking differently: Take 900 mg by mouth at bedtime. )  . glimepiride (AMARYL) 2 MG tablet Take 1 tablet (2 mg total) by mouth 2 (two) times daily.  . metFORMIN (GLUCOPHAGE) 1000 MG tablet Take 1 tablet (1,000 mg total) by mouth 2 (two) times daily with a meal.  . metoprolol succinate (TOPROL-XL) 25 MG 24 hr tablet Take 1 tablet (25 mg total) by mouth daily.    Marland Kitchen PARoxetine (PAXIL) 40 MG tablet Take 1 tablet (40 mg total) by mouth every morning.  . sacubitril-valsartan (ENTRESTO) 97-103 MG Take 1 tablet by mouth 2 (two) times daily.  Marland Kitchen spironolactone (ALDACTONE) 25 MG tablet Take 1 tablet (25 mg total) by mouth daily.     Allergies:   Sulfa antibiotics   Social History   Socioeconomic History  . Marital status: Single    Spouse name: Not on file  . Number of children: Not on file  . Years of education: Not on file  . Highest education level: Not on file  Occupational History  . Not on file  Tobacco Use  . Smoking status: Never Smoker  . Smokeless tobacco: Never Used  Vaping Use  . Vaping Use: Never used  Substance and Sexual Activity  . Alcohol use: Never  . Drug use: Never  . Sexual activity: Not on file  Other Topics Concern  . Not on file  Social History Narrative  . Not on file   Social Determinants of Health   Financial Resource Strain:   . Difficulty of Paying Living Expenses: Not on file  Food Insecurity:   . Worried About Programme researcher, broadcasting/film/video in the Last Year: Not on file  . Ran Out of Food in the Last Year: Not on file  Transportation Needs:   . Lack of Transportation (Medical): Not on file  . Lack of Transportation (Non-Medical): Not on file  Physical Activity:   . Days of Exercise per Week: Not on file  . Minutes of Exercise per Session: Not on file  Stress:   . Feeling of Stress : Not on file  Social Connections:   . Frequency of Communication with Friends and Family: Not on file  . Frequency of Social Gatherings with Friends and Family: Not on file  . Attends Religious Services: Not on file  . Active Member of Clubs or Organizations: Not on file  . Attends Banker Meetings: Not on file  . Marital Status: Not on file     Family History:  The patient's family history includes Diabetes in his mother.   ROS:   Please see the history of present illness.    ROS All other systems reviewed and  are negative.   PHYSICAL EXAM:   VS:  BP 110/70   Pulse 64   Ht 6\' 2"  (1.88 m)   Wt 192 lb (87.1 kg)   BMI 24.65 kg/m   Physical Exam   GEN: Well nourished, well developed in no acute distress HEENT: Normal NECK: No JVD; No carotid bruits LYMPHATICS: No lymphadenopathy CARDIAC:RRR, no murmurs, rubs, gallops RESPIRATORY:  Clear to auscultation without rales, wheezing or rhonchi  ABDOMEN: Soft, non-tender, non-distended MUSCULOSKELETAL:  No edema; No deformity  SKIN: Warm and dry NEUROLOGIC:  Alert and oriented x 3 PSYCHIATRIC:  Normal affect    Wt Readings from Last 3 Encounters:  07/22/20 192 lb (87.1 kg)  07/08/20 197 lb 4.8 oz (89.5 kg)  05/28/20 198 lb (89.8 kg)      Studies/Labs Reviewed:   EKG:  EKG is not ordered today.   Recent Labs: 03/30/2020: TSH 0.713 03/31/2020: Magnesium 2.0 06/08/2020: ALT 14 07/08/2020: BUN 22; Creatinine, Ser 1.06; Hemoglobin 12.0; Platelets 185; Potassium 3.8; Sodium 139   Lipid Panel    Component Value Date/Time   CHOL 110 06/08/2020 0908   TRIG 241 (H) 06/08/2020 0908   HDL 27 (L) 06/08/2020 0908   CHOLHDL 4.1 06/08/2020 0908   CHOLHDL 4.9 03/31/2020 0436   VLDL 44 (H) 03/31/2020 0436   LDLCALC 45 06/08/2020 0908   LDLDIRECT 56.0 01/07/2020 1025    Additional studies/ records that were reviewed today include:  2D echo 03/30/2020 IMPRESSIONS    1. Left ventricular ejection fraction, by estimation, is 20 to 25%. The  left ventricle has severely decreased function. The left ventricle  demonstrates global hypokinesis. The left ventricular internal cavity size  was moderately dilated. There is  moderate left ventricular hypertrophy. Left ventricular diastolic  parameters are consistent with Grade II diastolic dysfunction  (pseudonormalization). Elevated left atrial pressure.   2. Right ventricular systolic function is normal. The right ventricular  size is normal.   3. The mitral valve is normal in structure. Mild mitral  valve  regurgitation. No evidence of mitral stenosis.   4. The aortic valve is tricuspid. Aortic valve regurgitation is not  visualized. Mild aortic valve sclerosis is present, with no evidence of  aortic valve stenosis.   5. Aortic dilatation noted. There is mild dilatation of the aortic root  measuring 38 mm.    Cardiac Cath 03/31/2020 Conclusion    Ischemic cardiomyopathy with dilated LV and estimated ejection fraction 25%.  End-diastolic pressure between 18 and 21 mmHg consistent with chronic systolic heart failure.  Severe diffuse three-vessel coronary artery disease.  Left main is widely patent  LAD gives origin to a large diagonal which contains 90% proximal to mid stenosis in the entire mid to distal LAD is diffusely diseased in segments up to 90%.  Severe disease in the circumflex coronary artery with mid to distal high-grade multifocal stenosis in the large first obtuse marginal.  The second obtuse marginal is totally occluded.  The third obtuse marginal as mild to moderate diffuse disease without focal severe obstruction.  Right coronary is dominant.  Distal 50% RCA stenosis.  Diffuse high-grade proximal and distal PDA stenoses greater than 90% and subtotal occlusion of the continuation of the right coronary with small left ventricular branch is noted.  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  If so, would be reasonable to ask the cardiac surgical team whether the patient has adequate targets for grafting (seems unlikely to me).  There are no reasonable PCI options.  Medical therapy for heart failure, strong anti-ischemic regimen, and consideration of resynchronization/ICD.   Cardiac MRI 04/01/2020 IMPRESSION: 1. 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 Focal  asymmetric hypertrophy measuring up to 17mm in mid inferoseptum (9mm in posterior wall), which meets criteria for hypertrophic cardiomyopathy. In addition, there is dense midwall late gadolinium enhancement in the basal to mid septum. This is consistent with HCM. Amyloidosis would also  be on differential, as can present with asymmetric hypertrophy and similar LGE pattern. ECV is markedly elevated in the basal septum (65%) but in areas without LGE while the ECV is elevated (35%), it is not in amyloid range (<40%). Suspect HCM as the diagnosis, but would consider checking serum light chains and PYP scan to rule out amyloid   3. Subendocardial LGE consistent with prior infarct in portion of the basal inferior/lateral walls. >50% transmural LGE suggests nonviability in this area. Remainder of myocardium appears viable   4. Moderate LV dilatation with moderate systolic dysfunction (EF 33%)   5.  Normal RV size and systolic function (EF 57%)  Cardiac cath 03/31/20    Ischemic cardiomyopathy with dilated LV and estimated ejection fraction 25%.  End-diastolic pressure between 18 and 21 mmHg consistent with chronic systolic heart failure.  Severe diffuse three-vessel coronary artery disease.  Left main is widely patent  LAD gives origin to a large diagonal which contains 90% proximal to mid stenosis in the entire mid to distal LAD is diffusely diseased in segments up to 90%.  Severe disease in the circumflex coronary artery with mid to distal high-grade multifocal stenosis in the large first obtuse marginal.  The second obtuse marginal is totally occluded.  The third obtuse marginal as mild to moderate diffuse disease without focal severe obstruction.  Right coronary is dominant.  Distal 50% RCA stenosis.  Diffuse high-grade proximal and distal PDA stenoses greater than 90% and subtotal occlusion of the continuation of the right coronary with small left ventricular branch is noted.  Mild  pulmonary hypertension with mean PA pressure 26 mmHg   RECOMMENDATIONS:    Guideline directed therapy for severe systolic heart failure  Consider MRI imaging to determine if viable myocardium is present.  If so, would be reasonable to ask the cardiac surgical team whether the patient has adequate targets for grafting (seems unlikely to me).  There are no reasonable PCI options.  Medical therapy for heart failure, strong anti-ischemic regimen, and consideration of resynchronization/ICD.     Carotid dopplers 03/2020 Right Carotid: Velocities in the right ICA are consistent with a 1-39%  stenosis.   Left Carotid: Velocities in the left ICA are consistent with a 1-39%  stenosis.   Vertebrals: Bilateral vertebral arteries demonstrate antegrade flow.  Subclavians: Normal flow hemodynamics were seen in bilateral subclavian               arteries.   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 Consider repeat exam in the future if continued concern for transthyretin amyloidosis.     Electronically Signed   By: Genevive Bi M.D.   On: 04/03/2020 08:36  MR Cardiac 03/2020 IMPRESSION: 1. 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 Focal asymmetric hypertrophy measuring up to 4mm in mid inferoseptum (34mm in posterior wall), which meets criteria for hypertrophic cardiomyopathy. In addition, there is dense midwall late gadolinium enhancement in the basal to mid septum. This is consistent with HCM. Amyloidosis would also be on differential, as can present with asymmetric hypertrophy and similar LGE pattern. ECV is markedly elevated in the basal septum (65%) but in areas without LGE while the ECV is elevated (35%), it is not in amyloid range (<40%). Suspect HCM as the diagnosis, but would consider checking serum light  chains and PYP scan to rule out amyloid  3. Subendocardial LGE consistent with prior infarct in portion of the basal inferior/lateral walls. >50% transmural LGE suggests nonviability in this area. Remainder of myocardium appears viable   4. Moderate LV dilatation with moderate systolic dysfunction (EF 33%)   5.  Normal RV size and systolic function (EF 57%)     Electronically Signed   By: Epifanio Lesches MD   On: 04/02/2020 00:13      ASSESSMENT:    1. Syncope and collapse   2. Coronary artery disease involving native coronary artery of native heart without angina pectoris   3. Ischemic cardiomyopathy   4. HOCM (hypertrophic obstructive cardiomyopathy) (HCC)   5. Pure hypercholesterolemia   6. Primary hypertension   7. 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  Syncope  -etiology likely orthostatic was in a hot car and walking to the grocery store.   -he has not had any further episodes  -MRI consistent with HOCM and concerns for amyloid but PYP scan equivocal for amyloid and SPEP and UPEP were negative.   - St. Jude BiV ICD on 07/06/2020 complicated by microperforation with small apical pericardial effusion and lead revision of RV lead.   - repeat limited echo in 2 weeks to make sure small pericardial effusion from microperf has resolved  2.  ASCAD -cath showed three-vessel CAD with no targets for PCI  -turned down for CABG due to diabetic vessels and no adequate distal targets.   -Denies any anginal sx on medical therapy -continue ASA, statin, BB and Imdur  3.  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 Jude BiV ICD on 07/06/2020  -continue spiro  daily, Entresto 97-103mg  BID, Toprol XL  daily, Lasix  daily -SCr 1.06 and K+ 3.8 in Oct 2021  4.  HLD  -LDL goal < 70 and LDL in Sept 2021 was  45 -continue Atorvastatin  80 mg daily.    5.  HTN  -BP controlled on exam today -continue spiro  daily, Entresto 97-101mg  BID and Toprol XL  daily  6.  Type 2 DM with neuropathy -followed by PCP -continue metformin  BID  Medication Adjustments/Labs and Tests Ordered: Current medicines are reviewed at length with the patient today.  Concerns regarding medicines are outlined above.  Medication changes, Labs and Tests ordered today are listed in the Patient Instructions below. There are no Patient Instructions on file for this visit.   Signed, Armanda Magic, MD  07/22/2020 9:33 AM    [facility] [address] [place], [place], [place]  [address] Phone: [phone]; Fax: [phone]

## 2020-07-22 ENCOUNTER — Encounter: Payer: Self-pay | Admitting: Cardiology

## 2020-07-22 ENCOUNTER — Other Ambulatory Visit: Payer: Self-pay

## 2020-07-22 ENCOUNTER — Other Ambulatory Visit: Payer: Medicare Other | Admitting: *Deleted

## 2020-07-22 ENCOUNTER — Ambulatory Visit (INDEPENDENT_AMBULATORY_CARE_PROVIDER_SITE_OTHER): Payer: Medicare Other | Admitting: Cardiology

## 2020-07-22 VITALS — BP 110/70 | HR 64 | Ht 74.0 in | Wt 192.0 lb

## 2020-07-22 DIAGNOSIS — I10 Essential (primary) hypertension: Secondary | ICD-10-CM

## 2020-07-22 DIAGNOSIS — I255 Ischemic cardiomyopathy: Secondary | ICD-10-CM | POA: Diagnosis not present

## 2020-07-22 DIAGNOSIS — R55 Syncope and collapse: Secondary | ICD-10-CM | POA: Diagnosis not present

## 2020-07-22 DIAGNOSIS — I2510 Atherosclerotic heart disease of native coronary artery without angina pectoris: Secondary | ICD-10-CM | POA: Diagnosis not present

## 2020-07-22 DIAGNOSIS — E7800 Pure hypercholesterolemia, unspecified: Secondary | ICD-10-CM

## 2020-07-22 DIAGNOSIS — E1142 Type 2 diabetes mellitus with diabetic polyneuropathy: Secondary | ICD-10-CM

## 2020-07-22 DIAGNOSIS — I421 Obstructive hypertrophic cardiomyopathy: Secondary | ICD-10-CM | POA: Diagnosis not present

## 2020-07-22 DIAGNOSIS — I5021 Acute systolic (congestive) heart failure: Secondary | ICD-10-CM

## 2020-07-22 LAB — BASIC METABOLIC PANEL
BUN/Creatinine Ratio: 23 (ref 10–24)
BUN: 26 mg/dL (ref 8–27)
CO2: 22 mmol/L (ref 20–29)
Calcium: 9.8 mg/dL (ref 8.6–10.2)
Chloride: 96 mmol/L (ref 96–106)
Creatinine, Ser: 1.15 mg/dL (ref 0.76–1.27)
GFR calc Af Amer: 71 mL/min/{1.73_m2} (ref >59)
GFR calc non Af Amer: 61 mL/min/{1.73_m2} (ref >59)
Glucose: 271 mg/dL — ABNORMAL HIGH (ref 65–99)
Potassium: 4.9 mmol/L (ref 3.5–5.2)
Sodium: 135 mmol/L (ref 134–144)

## 2020-07-22 NOTE — Addendum Note (Signed)
Addended by: Theresia Majors on: 07/22/2020 09:52 AM   Modules accepted: Orders

## 2020-07-22 NOTE — Patient Instructions (Signed)
Medication Instructions:  Your physician recommends that you continue on your current medications as directed. Please refer to the Current Medication list given to you today.  *If you need a refill on your cardiac medications before your next appointment, please call your pharmacy*   Testing/Procedures: Your physician has requested that you have an echocardiogram. Echocardiography is a painless test that uses sound waves to create images of your heart. It provides your doctor with information about the size and shape of your heart and how well your heart's chambers and valves are working. This procedure takes approximately one hour. There are no restrictions for this procedure.  Follow-Up: At [facility], you and your health needs are our priority.  As part of our continuing mission to provide you with exceptional heart care, we have created designated Provider Care Teams.  These Care Teams include your primary Cardiologist (physician) and Advanced Practice Providers (APPs -  Physician Assistants and Nurse Practitioners) who all work together to provide you with the care you need, when you need it.  Your next appointment:   6 month(s)  The format for your next appointment:   In Person  Provider:   You may see Traci Turner, MD or one of the following Advanced Practice Providers on your designated Care Team:    Dayna Dunn, PA-C  Michele Lenze, PA-C   

## 2020-07-27 NOTE — Progress Notes (Addendum)
Patient ID: Ryan [name]                 DOB: March 04, 1941                      MRN: [id number]     HPI: Ryan [name] is a 79 y.o. male referred by Dr. Radford Pax to pharmacy clinic for HF medication management. PMH is significant for hypertension, HLD, DM, anxiety and  syncope that was likely orthostatic in origin . Most recent LVEF 25-30% on echo 07/07/20. ICD (St Jude CRT-D) was implanted 07/06/20 w/ post-surgical hypotensive & unresponsive event d/t RV apical effusion which has since recovered.   He was seen in pharmacy HF clinic on 07/14/20. At that time he was feeling weak from his ICD placement. He complained of arm pain & presented with a large hematoma on his left arm. The patient stated that he was told that he had lost a significant amount of blood during his ICD placement which he was told is causing him to get dizzy when standing. He did endorse significant lightheadedness upon standing. At visit, spironolactone was increased to 25 mg daily. BMP is stable after increase.   Today he returns to pharmacy clinic for further medication titration. At last visit with Dr. Radford Pax on 07/22/20, medications were continued and repeat modified echo was scheduled for 08/12/20 to ensure hematoma resolved. Symptomatically, he is feeling great overall, has dizziness/lightheadedness only with jumping up quickly. Denies fatigue. Patient denies SOB/DOE, able to walk an hour without SOB. Patient feeling much better since ICD placement. He does not feel weakness and is back to long daily walks. BP previously controlled in the 120s in office and at home. Today BP was 140/76. Patient is concerned about his son, who is a Therapist, nutritional, is leaving since he has helped him since his diagnosis. He has made many lifestyle changes including eating lots of vegetables and lean meats, reducing fast food and soft drinks that have helped his heart failure and diabetes. Last A1c 7.3% on 07/02/20, improved from 10.4%.    Current  CHF meds:  Metoprolol succinate 25 mg daily  Entresto 97/103 mg BID  Spironolactone 25 mg daily  Furosemide 20 mg daily  Previously tried: Losartan 50 mg daily  BP goal: <130/80  Family History: Diabetes (mother)  Social History: Never smoker. No alcohol.  Diet:  Patient's son was a Lawyer who helps him meal prep. Breakfast:2 eggs, small piece of chicken or pork, gritz or cereal, water or coffee (1-2 cups occasionally) or iced tea. Patient goes to [place] once every 2-3 weeks and will have bacon or sausage and half of a biscuit. Lunch:baked chicken, salad, boiled eggs, no red meat. Has charcoal burger every now and again with mustard. Dinner:bakedchicken (boiled or baked), broccoli, carrots, or homemade soup. Will go to [facility] every now and again and have a baked potato. Snacks: Lance crackers, trail mix   Drinks water, soft drink once in while. Diet lemonade once in a while once or twice per week to avoid soft drinks.  Does not add saltto his food.Avoids fried foods.  Exercise:  Walks 60 minutes daily which has improved from 35 minutes per day   Home BP readings: Runs around 110-120/70, BG 125-140, HR 63 using home cuff. Also gets BP checked at [facility] and reports it is the same.   Wt Readings from Last 3 Encounters:  07/22/20 192 lb (87.1 kg)  07/08/20 197 lb 4.8 oz (  89.5 kg)  05/28/20 198 lb (89.8 kg)   BP Readings from Last 3 Encounters:  07/22/20 110/70  07/14/20 124/70  07/08/20 125/85   Pulse Readings from Last 3 Encounters:  07/22/20 64  07/14/20 62  07/08/20 68    Renal function: Estimated Creatinine Clearance: 62.5 mL/min (by C-G formula based on SCr of 1.15 mg/dL).  Past Medical History:  Diagnosis Date  . 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 B12 deficiency   . BPH (benign prostatic hyperplasia)   . CAD (coronary artery disease), native coronary artery    cardiac cath with three-vessel CAD no targets  for PCI and turned down for CABG,  . DM (diabetes mellitus), type 2, uncontrolled w/neurologic complication ([place])   . GAD (generalized anxiety disorder)   . HOCM (hypertrophic obstructive cardiomyopathy) ([place])    dx by cardiac MRI  . HTN (hypertension)   . Hyperlipidemia   . Ischemic cardiomyopathy    EF 20-25% felt mixed etiology nonischemic/ischemic  . Neuropathy, peripheral     Current Outpatient Medications on File Prior to Visit  Medication Sig Dispense Refill  . acetaminophen (TYLENOL) 325 MG tablet Take 1-2 tablets (325-650 mg total) by mouth every 4 (four) hours as needed for mild pain.    Marland Kitchen aspirin EC 81 MG tablet Take 81 mg by mouth daily. Swallow whole.    Marland Kitchen atorvastatin (LIPITOR) 80 MG tablet Take 1 tablet (80 mg total) by mouth at bedtime. 90 tablet 3  . blood glucose meter kit and supplies KIT Dispense based on patient and insurance preference. Use up to four times daily as directed. (FOR ICD-9 250.00, 250.01). 1 each 0  . cyanocobalamin (,VITAMIN B-12,) 1000 MCG/ML injection inject 1 ml once a month (Patient taking differently: Inject 1,000 mcg into the muscle every 30 (thirty) days. ) 6 mL 3  . furosemide (LASIX) 20 MG tablet Take 1 tablet (20 mg total) by mouth daily. 30 tablet 1  . gabapentin (NEURONTIN) 300 MG capsule TAKE ONE CAPSULE BY MOUTH THREE TIMES A DAY (Patient taking differently: Take 900 mg by mouth at bedtime. ) 270 capsule 2  . glimepiride (AMARYL) 2 MG tablet Take 1 tablet (2 mg total) by mouth 2 (two) times daily. 60 tablet 1  . Lancets (ONETOUCH DELICA PLUS [id number]) MISC Apply 1 each topically 4 (four) times daily.    . metFORMIN (GLUCOPHAGE) 1000 MG tablet Take 1 tablet (1,000 mg total) by mouth 2 (two) times daily with a meal. 60 tablet 1  . metoprolol succinate (TOPROL-XL) 25 MG 24 hr tablet Take 1 tablet (25 mg total) by mouth daily. 90 tablet 3  . ONETOUCH VERIO test strip 1 each 4 (four) times daily.    Marland Kitchen PARoxetine (PAXIL) 40 MG tablet Take 1  tablet (40 mg total) by mouth every morning. 90 tablet 1  . sacubitril-valsartan (ENTRESTO) 97-103 MG Take 1 tablet by mouth 2 (two) times daily. 180 tablet 3  . spironolactone (ALDACTONE) 25 MG tablet Take 1 tablet (25 mg total) by mouth daily. 90 tablet 3  . SYRINGE-NEEDLE, DISP, 3 ML (BD SAFETYGLIDE SYRINGE/NEEDLE) 25G X 1" 3 ML MISC Use for B12 injections 100 each 11  . zolpidem (AMBIEN) 5 MG tablet Take 1 tablet (5 mg total) by mouth at bedtime as needed for sleep. (Patient not taking: Reported on 07/22/2020) 30 tablet 1   No current facility-administered medications on file prior to visit.    Allergies  Allergen Reactions  . Sulfa Antibiotics  Blood in Urine   Vitals Today:  BP: 140/76  HR: 60  SpO2: 97%  Weight: 197.6 lbs  Assessment/Plan:  1. CHF - Patient presents today with BP above goal of <130/80. Previous office readings and home readings have been at goal. Patient reports taking and tolerating medications. Patient presents today without CHF symptoms. Patient is feeling much better since ICD placement and is back to daily walks. Since paatient has concomitant diabetes will initiate Jardiance 10 mg daily. Discussed efficacy of medication and side effects. Patient instructed to bring in social security statement so we can sign him up for patient assistance. Consider metoprolol succinate uptitration in the future since pt now has ICD placement. Will confirm with Dr. Lovena Le that patient is now paced. Patient interested in maintaining positive lifestyle modifications. Discussed avoiding starchy foods like corn and white rice and eating out only on occasion. Encouraged patient to meal prep to prevent fast food temptation. Follow up in clinic in 1 month.  Thank you,   Benna Dunks  PharmD Candidate, Class of 2022  Ramond Dial, Pharm.D, BCPS, CPP [place]  [address], [place], [place] [address]  Phone: [phone]; Fax: [phone]

## 2020-07-28 ENCOUNTER — Other Ambulatory Visit: Payer: Self-pay

## 2020-07-28 ENCOUNTER — Ambulatory Visit (INDEPENDENT_AMBULATORY_CARE_PROVIDER_SITE_OTHER): Payer: Medicare Other | Admitting: Pharmacist

## 2020-07-28 VITALS — BP 140/76 | HR 60 | Wt 197.6 lb

## 2020-07-28 DIAGNOSIS — I421 Obstructive hypertrophic cardiomyopathy: Secondary | ICD-10-CM | POA: Diagnosis not present

## 2020-07-28 NOTE — Patient Instructions (Signed)
It was a pleasure seeing you today!   Continue taking your current medications.   Plan to start Jardiance 10 mg daily.   Drop off social security statement as proof of income so we can apply for patient assistance program with the manufacturer so you will not have any copay.   Continue limiting salt in your diet. Continue eating a lot of vegetables and lean meats. Try to limit starches such as corn and white rice. Brown rice is a good option with less carbohydrates.

## 2020-08-04 ENCOUNTER — Telehealth: Payer: Self-pay | Admitting: Pharmacist

## 2020-08-04 NOTE — Telephone Encounter (Signed)
Patient dropped off proof of income. Application for Jardiance patient assistance [employer] was faxed.

## 2020-08-10 ENCOUNTER — Telehealth: Payer: Self-pay

## 2020-08-10 NOTE — Telephone Encounter (Signed)
Patient approved to receive Jardiance through [facility] patient assistance program. Approved through 09/18/21. Called to inform patient. He was very appreciative of [place] setting him up for patient assistance.

## 2020-08-12 ENCOUNTER — Other Ambulatory Visit: Payer: Self-pay

## 2020-08-12 ENCOUNTER — Ambulatory Visit (HOSPITAL_COMMUNITY): Payer: Medicare Other | Attending: Cardiology

## 2020-08-12 DIAGNOSIS — I421 Obstructive hypertrophic cardiomyopathy: Secondary | ICD-10-CM | POA: Diagnosis present

## 2020-08-12 LAB — ECHOCARDIOGRAM LIMITED
Area-P 1/2: 1.94 cm2
S' Lateral: 4.6 cm

## 2020-08-26 ENCOUNTER — Other Ambulatory Visit: Payer: Self-pay

## 2020-08-26 ENCOUNTER — Ambulatory Visit (INDEPENDENT_AMBULATORY_CARE_PROVIDER_SITE_OTHER): Payer: Medicare Other | Admitting: Pharmacist

## 2020-08-26 VITALS — BP 128/76 | HR 65

## 2020-08-26 DIAGNOSIS — I255 Ischemic cardiomyopathy: Secondary | ICD-10-CM

## 2020-08-26 NOTE — Patient Instructions (Addendum)
It was nice to see you today!  Please continue taking :  Metoprolol succinate 25 mg daily  Entresto 97/103 mg BID  Spironolactone 25 mg daily  Furosemide 20 mg daily Jardiance 10mg  daily  Continue checking your weight and blood pressure  Call me with any questions [phone]  [phone] Dr. [phone]

## 2020-08-26 NOTE — Progress Notes (Signed)
Patient ID: Ryan [name]                 DOB: 07-Jan-1941                      MRN: [id number]     HPI: Ryan [name] is a 79 y.o. male referred by Dr. Radford Pax to pharmacy clinic for HF medication management. PMH is significant for hypertension, HLD, DM, anxiety and  syncope that was likely orthostatic in origin . Most recent LVEF 25-30% on echo 07/07/20. ICD (St Jude CRT-D) was implanted 07/06/20 w/ post-surgical hypotensive & unresponsive event d/t RV apical effusion which has since recovered.   He was last seen in CVRR clinic on 07/28/20. He reported feeling well. We were able to get him approved for patient assistance through [facility] cares for Jardiance. He had a limited echo done on 08/12/20 which showed a slight improvement of EF to 30-35%.  Patient presents today for follow up. He states he feels good. Denies dizziness, lightheadedness, headache, blurred vision, SOB or swelling. Has been walking about an hour almost everyday. States the more he walks the less his legs hurt. Denies SOB with walking. Started jardiance. No issues. States he has been doing good with his diet and meal prepping.    Current CHF meds:  Metoprolol succinate 25 mg daily  Entresto 97/103 mg BID  Spironolactone 25 mg daily  Furosemide 20 mg daily Jardiance $RemoveBeforeD'10mg'[id number]$  daily  Previously tried: Losartan 50 mg daily  BP goal: <130/80  Family History: Diabetes (mother)  Social History: Never smoker. No alcohol.  Diet:  Patient's son was a Lawyer who helps him meal prep. Breakfast:2 eggs, small piece of chicken or pork, gritz or cereal, water or coffee (1-2 cups occasionally) or iced tea. Patient goes to [place] once every 2-3 weeks and will have bacon or sausage and half of a biscuit. Lunch:baked chicken, salad, boiled eggs, no red meat. Has charcoal burger every now and again with mustard. Dinner:bakedchicken (boiled or baked), broccoli, carrots, or homemade soup. Will go to [facility] every now and  again and have a baked potato. Snacks: Lance crackers, trail mix   Drinks water, soft drink once in while. Diet lemonade once in a while once or twice per week to avoid soft drinks.  Does not add saltto his food.Avoids fried foods.  Exercise:  Walks 60 minutes daily   Home BP readings: Runs around 130/72-80 per pt report   Wt Readings from Last 3 Encounters:  07/28/20 197 lb 9.6 oz (89.6 kg)  07/22/20 192 lb (87.1 kg)  07/08/20 197 lb 4.8 oz (89.5 kg)   BP Readings from Last 3 Encounters:  07/28/20 140/76  07/22/20 110/70  07/14/20 124/70   Pulse Readings from Last 3 Encounters:  07/28/20 60  07/22/20 64  07/14/20 62    Renal function: CrCl cannot be calculated (Patient's most recent lab result is older than the maximum 21 days allowed.).  Past Medical History:  Diagnosis Date  . 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 B12 deficiency   . BPH (benign prostatic hyperplasia)   . CAD (coronary artery disease), native coronary artery    cardiac cath with three-vessel CAD no targets for PCI and turned down for CABG,  . DM (diabetes mellitus), type 2, uncontrolled w/neurologic complication ([place])   . GAD (generalized anxiety disorder)   . HOCM (hypertrophic obstructive cardiomyopathy) ([place])    dx by cardiac MRI  . HTN (hypertension)   .  Hyperlipidemia   . Ischemic cardiomyopathy    EF 20-25% felt mixed etiology nonischemic/ischemic  . Neuropathy, peripheral     Current Outpatient Medications on File Prior to Visit  Medication Sig Dispense Refill  . acetaminophen (TYLENOL) 325 MG tablet Take 1-2 tablets (325-650 mg total) by mouth every 4 (four) hours as needed for mild pain.    Marland Kitchen aspirin EC 81 MG tablet Take 81 mg by mouth daily. Swallow whole.    Marland Kitchen atorvastatin (LIPITOR) 80 MG tablet Take 1 tablet (80 mg total) by mouth at bedtime. 90 tablet 3  . blood glucose meter kit and supplies KIT Dispense based on patient and insurance preference. Use  up to four times daily as directed. (FOR ICD-9 250.00, 250.01). 1 each 0  . cyanocobalamin (,VITAMIN B-12,) 1000 MCG/ML injection inject 1 ml once a month (Patient taking differently: Inject 1,000 mcg into the muscle every 30 (thirty) days. ) 6 mL 3  . empagliflozin (JARDIANCE) 10 MG TABS tablet Take 1 tablet (10 mg total) by mouth daily before breakfast. 90 tablet 3  . furosemide (LASIX) 20 MG tablet Take 1 tablet (20 mg total) by mouth daily. 30 tablet 1  . gabapentin (NEURONTIN) 300 MG capsule TAKE ONE CAPSULE BY MOUTH THREE TIMES A DAY (Patient taking differently: Take 900 mg by mouth at bedtime. ) 270 capsule 2  . glimepiride (AMARYL) 2 MG tablet Take 1 tablet (2 mg total) by mouth 2 (two) times daily. 60 tablet 1  . Lancets (ONETOUCH DELICA PLUS [id number]) MISC Apply 1 each topically 4 (four) times daily.    . metFORMIN (GLUCOPHAGE) 1000 MG tablet Take 1 tablet (1,000 mg total) by mouth 2 (two) times daily with a meal. 60 tablet 1  . metoprolol succinate (TOPROL-XL) 25 MG 24 hr tablet Take 1 tablet (25 mg total) by mouth daily. 90 tablet 3  . ONETOUCH VERIO test strip 1 each 4 (four) times daily.    Marland Kitchen PARoxetine (PAXIL) 40 MG tablet Take 1 tablet (40 mg total) by mouth every morning. 90 tablet 1  . sacubitril-valsartan (ENTRESTO) 97-103 MG Take 1 tablet by mouth 2 (two) times daily. 180 tablet 3  . spironolactone (ALDACTONE) 25 MG tablet Take 1 tablet (25 mg total) by mouth daily. 90 tablet 3  . SYRINGE-NEEDLE, DISP, 3 ML (BD SAFETYGLIDE SYRINGE/NEEDLE) 25G X 1" 3 ML MISC Use for B12 injections 100 each 11  . zolpidem (AMBIEN) 5 MG tablet Take 1 tablet (5 mg total) by mouth at bedtime as needed for sleep. (Patient not taking: Reported on 07/22/2020) 30 tablet 1   No current facility-administered medications on file prior to visit.    Allergies  Allergen Reactions  . Sulfa Antibiotics     Blood in Urine    Assessment/Plan:  1. CHF -  Patient blood pressure is at goal of <130/80 in  clinic today. He is tolerating medications well. He is on target dose entresto, spironolactone and Jardiance. Metoprolol dose was once limited due to HR. He now has ICD placed. I will confirm he is also paced and see what Dr. Lovena Le thinks about increasing metoprolol dose. Patient will see Dr. Lovena Le in Jan. Follow up with me as needed. He gets both [place] through patient assistance. Still have a while before these will need to be renewed.  Thank you,   Ramond Dial, Pharm.D, BCPS, CPP [place]  [address] [place], [place] [address]  Phone: [phone]; Fax: [phone]

## 2020-08-27 ENCOUNTER — Other Ambulatory Visit: Payer: Self-pay | Admitting: Internal Medicine

## 2020-08-27 DIAGNOSIS — E119 Type 2 diabetes mellitus without complications: Secondary | ICD-10-CM

## 2020-08-31 ENCOUNTER — Telehealth: Payer: Self-pay | Admitting: Pharmacist

## 2020-08-31 MED ORDER — METOPROLOL SUCCINATE ER 50 MG PO TB24: 50.0000 mg | ORAL_TABLET | Freq: Every day | ORAL | 3 refills | Status: DC

## 2020-08-31 NOTE — Telephone Encounter (Signed)
Per Dr. Ladona Ridgel patient is paced, ok to increase dose of metoprolol. I called patient and advised that we will increase metoprolol to 50mg  daily. He can take 2 of the 25mg  tablets until he runs out. I will send in new Rx for the 50mg  tablets. Follow up Jan 20 with Dr. .

## 2020-10-05 ENCOUNTER — Ambulatory Visit: Payer: Medicare Other

## 2020-10-08 ENCOUNTER — Other Ambulatory Visit: Payer: Self-pay

## 2020-10-08 ENCOUNTER — Ambulatory Visit: Payer: Medicare Other | Admitting: Internal Medicine

## 2020-10-08 ENCOUNTER — Encounter: Payer: Self-pay | Admitting: Internal Medicine

## 2020-10-08 VITALS — BP 132/72 | HR 92 | Ht 74.0 in | Wt 197.8 lb

## 2020-10-08 DIAGNOSIS — I480 Paroxysmal atrial fibrillation: Secondary | ICD-10-CM | POA: Diagnosis not present

## 2020-10-08 DIAGNOSIS — I447 Left bundle-branch block, unspecified: Secondary | ICD-10-CM | POA: Diagnosis not present

## 2020-10-08 DIAGNOSIS — I421 Obstructive hypertrophic cardiomyopathy: Secondary | ICD-10-CM

## 2020-10-08 DIAGNOSIS — Z95810 Presence of automatic (implantable) cardiac defibrillator: Secondary | ICD-10-CM

## 2020-10-08 MED ORDER — RIVAROXABAN 20 MG PO TABS: 20.0000 mg | ORAL_TABLET | Freq: Every day | ORAL | 11 refills | Status: DC

## 2020-10-08 NOTE — Patient Instructions (Addendum)
Medication Instructions:  Your physician has recommended you make the following change in your medication:   1.  STOP aspirin  2.  START taking Xarelto 20 mg- Take one tablet by mouth daily with your largest meal  Labwork: None ordered.  Testing/Procedures: None ordered.  Follow-Up: Your physician wants you to follow-up in: one year with Dr. Ladona Ridgel.   You will receive a reminder letter in the mail two months in advance. If you don't receive a letter, please call our office to schedule the follow-up appointment.  Remote monitoring is used to monitor your ICD from home. This monitoring reduces the number of office visits required to check your device to one time per year. It allows [place] to keep an eye on the functioning of your device to ensure it is working properly. You are scheduled for a device check from home on 01/04/2021. You may send your transmission at any time that day. If you have a wireless device, the transmission will be sent automatically. After your physician reviews your transmission, you will receive a postcard with your next transmission date.  Any Other Special Instructions Will Be Listed Below (If Applicable).  If you need a refill on your cardiac medications before your next appointment, please call your pharmacy.  Rivaroxaban oral tablets What is this medicine? RIVAROXABAN (ri va ROX a ban) is an anticoagulant (blood thinner). It is used to treat blood clots in the lungs or in the veins. It is also used to prevent blood clots in the lungs or in the veins. It is also used to lower the chance of stroke in people with a medical condition called atrial fibrillation. This medicine may be used for other purposes; ask your health care provider or pharmacist if you have questions. COMMON BRAND NAME(S): Xarelto, Xarelto Starter Pack What should I tell my health care provider before I take this medicine? They need to know if you have any of these conditions:  antiphospholipid  antibody syndrome  artificial heart valve  bleeding disorders  bleeding in the brain  blood in your stools (black or tarry stools) or if you have blood in your vomit  history of blood clots  history of stomach bleeding  kidney disease  liver disease  low blood counts, like low white cell, platelet, or red cell counts  recent or planned spinal or epidural procedure  take medicines that treat or prevent blood clots  an unusual or allergic reaction to rivaroxaban, other medicines, foods, dyes, or preservatives  pregnant or trying to get pregnant  breast-feeding How should I use this medicine? Take this medicine by mouth with a glass of water. Follow the directions on the prescription label. Take your medicine at regular intervals. Do not take it more often than directed. Do not stop taking except on your doctor's advice. Stopping this medicine may increase your risk of a blood clot. Be sure to refill your prescription before you run out of medicine. If you are taking this medicine after hip or knee replacement surgery, take it with or without food. If you are taking this medicine for atrial fibrillation, take it with your evening meal. If you are taking this medicine to treat blood clots, take it with food at the same time each day. If you are unable to swallow your tablet, you may crush the tablet and mix it in applesauce. Then, immediately eat the applesauce. You should eat more food right after you eat the applesauce containing the crushed tablet. Talk to your  pediatrician regarding the use of this medicine in children. Special care may be needed. Overdosage: If you think you have taken too much of this medicine contact a poison control center or emergency room at once. NOTE: This medicine is only for you. Do not share this medicine with others. What if I miss a dose? If you take your medicine once a day and miss a dose, take the missed dose as soon as you remember. If it is  almost time for your next dose, take only that dose. Do not take double or extra doses. If you take your medicine twice a day and miss a dose, take the missed dose immediately. In this instance, 2 tablets may be taken at the same time. The next day you should take 1 tablet twice a day as directed. What may interact with this medicine? Do not take this medicine with any of the following medications:  defibrotide This medicine may also interact with the following medications:  aspirin and aspirin-like medicines  certain antibiotics like erythromycin, azithromycin, and clarithromycin  certain medicines for fungal infections like ketoconazole and itraconazole  certain medicines for irregular heart beat like amiodarone, quinidine, dronedarone  certain medicines for seizures like carbamazepine, phenytoin  certain medicines that treat or prevent blood clots like warfarin, enoxaparin, and dalteparin  conivaptan  felodipine  indinavir  lopinavir; ritonavir  NSAIDS, medicines for pain and inflammation, like ibuprofen or naproxen  ranolazine  rifampin  ritonavir  SNRIs, medicines for depression, like desvenlafaxine, duloxetine, levomilnacipran, venlafaxine  SSRIs, medicines for depression, like citalopram, escitalopram, fluoxetine, fluvoxamine, paroxetine, sertraline  St. John's wort  verapamil This list may not describe all possible interactions. Give your health care provider a list of all the medicines, herbs, non-prescription drugs, or dietary supplements you use. Also tell them if you smoke, drink alcohol, or use illegal drugs. Some items may interact with your medicine. What should I watch for while using this medicine? Visit your healthcare professional for regular checks on your progress. You may need blood work done while you are taking this medicine. Your condition will be monitored carefully while you are receiving this medicine. It is important not to miss any  appointments. Avoid sports and activities that might cause injury while you are using this medicine. Severe falls or injuries can cause unseen bleeding. Be careful when using sharp tools or knives. Consider using an Copy. Take special care brushing or flossing your teeth. Report any injuries, bruising, or red spots on the skin to your healthcare professional. If you are going to need surgery or other procedure, tell your healthcare professional that you are taking this medicine. Wear a medical ID bracelet or chain. Carry a card that describes your disease and details of your medicine and dosage times. What side effects may I notice from receiving this medicine? Side effects that you should report to your doctor or health care professional as soon as possible:  allergic reactions like skin rash, itching or hives, swelling of the face, lips, or tongue  back pain  redness, blistering, peeling or loosening of the skin, including inside the mouth  signs and symptoms of bleeding such as bloody or black, tarry stools; red or dark-brown urine; spitting up blood or brown material that looks like coffee grounds; red spots on the skin; unusual bruising or bleeding from the eye, gums, or nose  signs and symptoms of a blood clot such as chest pain; shortness of breath; pain, swelling, or warmth in the leg  signs and symptoms of a stroke such as changes in vision; confusion; trouble speaking or understanding; severe headaches; sudden numbness or weakness of the face, arm or leg; trouble walking; dizziness; loss of coordination Side effects that usually do not require medical attention (report to your doctor or health care professional if they continue or are bothersome):  dizziness  muscle pain This list may not describe all possible side effects. Call your doctor for medical advice about side effects. You may report side effects to FDA at 1-800-FDA-1088. Where should I keep my medicine? Keep  out of the reach of children. Store at room temperature between 15 and 30 degrees C (59 and 86 degrees F). Throw away any unused medicine after the expiration date. NOTE: This sheet is a summary. It may not cover all possible information. If you have questions about this medicine, talk to your doctor, pharmacist, or health care provider.  2021 Elsevier/Gold Standard (2018-12-03 09:45:59)

## 2020-10-08 NOTE — Progress Notes (Signed)
HPI Ryan [name] returns today for followup. He is a pleasant 80 yo man with chronic systolic heart failure, s/p biv ICD insertion complicated by pericardial effusion for which he underwent lead revision. He has done well in the interim with no chest pain or sob. He denies palpitations. His repeat echo a month after his implant showed that he had a residual trivial effusion. He has no complaints. Allergies  Allergen Reactions  . Sulfa Antibiotics     Blood in Urine     Current Outpatient Medications  Medication Sig Dispense Refill  . acetaminophen (TYLENOL) 325 MG tablet Take 1-2 tablets (325-650 mg total) by mouth every 4 (four) hours as needed for mild pain.    Marland Kitchen aspirin EC 81 MG tablet Take 81 mg by mouth daily. Swallow whole.    Marland Kitchen atorvastatin (LIPITOR) 80 MG tablet Take 1 tablet (80 mg total) by mouth at bedtime. 90 tablet 3  . blood glucose meter kit and supplies KIT Dispense based on patient and insurance preference. Use up to four times daily as directed. (FOR ICD-9 250.00, 250.01). 1 each 0  . cyanocobalamin (,VITAMIN B-12,) 1000 MCG/ML injection inject 1 ml once a month 6 mL 3  . empagliflozin (JARDIANCE) 10 MG TABS tablet Take 1 tablet (10 mg total) by mouth daily before breakfast. 90 tablet 3  . furosemide (LASIX) 20 MG tablet Take 1 tablet (20 mg total) by mouth daily. 30 tablet 1  . gabapentin (NEURONTIN) 300 MG capsule TAKE ONE CAPSULE BY MOUTH THREE TIMES A DAY 270 capsule 2  . glimepiride (AMARYL) 2 MG tablet Take 1 tablet (2 mg total) by mouth 2 (two) times daily. 60 tablet 1  . Lancets (ONETOUCH DELICA PLUS [id number]) MISC Apply 1 each topically 4 (four) times daily.    . metFORMIN (GLUCOPHAGE) 1000 MG tablet TAKE ONE TABLET BY MOUTH TWICE A DAY WITH MEALS 180 tablet 1  . metoprolol succinate (TOPROL-XL) 50 MG 24 hr tablet Take 1 tablet (50 mg total) by mouth daily. Take with or immediately following a meal. 90 tablet 3  . ONETOUCH VERIO test strip 1 each 4 (four)  times daily.    Marland Kitchen PARoxetine (PAXIL) 40 MG tablet Take 1 tablet (40 mg total) by mouth every morning. 90 tablet 1  . sacubitril-valsartan (ENTRESTO) 97-103 MG Take 1 tablet by mouth 2 (two) times daily. 180 tablet 3  . spironolactone (ALDACTONE) 25 MG tablet Take 1 tablet (25 mg total) by mouth daily. 90 tablet 3  . SYRINGE-NEEDLE, DISP, 3 ML (BD SAFETYGLIDE SYRINGE/NEEDLE) 25G X 1" 3 ML MISC Use for B12 injections 100 each 11  . zolpidem (AMBIEN) 5 MG tablet Take 1 tablet (5 mg total) by mouth at bedtime as needed for sleep. 30 tablet 1   No current facility-administered medications for this visit.     Past Medical History:  Diagnosis Date  . 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 B12 deficiency   . BPH (benign prostatic hyperplasia)   . CAD (coronary artery disease), native coronary artery    cardiac cath with three-vessel CAD no targets for PCI and turned down for CABG,  . DM (diabetes mellitus), type 2, uncontrolled w/neurologic complication ([place])   . GAD (generalized anxiety disorder)   . HOCM (hypertrophic obstructive cardiomyopathy) ([place])    dx by cardiac MRI  . HTN (hypertension)   . Hyperlipidemia   . Ischemic cardiomyopathy    EF 20-25% felt mixed etiology nonischemic/ischemic  . Neuropathy,  peripheral     ROS:   All systems reviewed and negative except as noted in the HPI.   Past Surgical History:  Procedure Laterality Date  . BIV ICD INSERTION CRT-D N/A 07/06/2020   Procedure: BIV ICD INSERTION CRT-D;  Surgeon: Evans Lance, MD;  Location: [facility];  Service: Cardiovascular;  Laterality: N/A;  . LEAD REVISION/REPAIR N/A 07/06/2020   Procedure: LEAD REVISION/REPAIR;  Surgeon: Evans Lance, MD;  Location: [facility];  Service: Cardiovascular;  Laterality: N/A;  . RIGHT/LEFT HEART CATH AND CORONARY ANGIOGRAPHY N/A 03/31/2020   Procedure: RIGHT/LEFT HEART CATH AND CORONARY ANGIOGRAPHY;  Surgeon: Belva Crome, MD;   Location: [facility];  Service: Cardiovascular;  Laterality: N/A;     Family History  Problem Relation Age of Onset  . Diabetes Mother      Social History   Socioeconomic History  . Marital status: Single    Spouse name: Not on file  . Number of children: Not on file  . Years of education: Not on file  . Highest education level: Not on file  Occupational History  . Not on file  Tobacco Use  . Smoking status: Never Smoker  . Smokeless tobacco: Never Used  Vaping Use  . Vaping Use: Never used  Substance and Sexual Activity  . Alcohol use: Never  . Drug use: Never  . Sexual activity: Not on file  Other Topics Concern  . Not on file  Social History Narrative  . Not on file   Social Determinants of Health   Financial Resource Strain: Not on file  Food Insecurity: Not on file  Transportation Needs: Not on file  Physical Activity: Not on file  Stress: Not on file  Social Connections: Not on file  Intimate Partner Violence: Not on file     BP 132/72   Pulse 92   Ht '6\' 2"'  (1.88 m)   Wt 197 lb 12.8 oz (89.7 kg)   SpO2 98%   BMI 25.40 kg/m   Physical Exam:  Well appearing NAD HEENT: Unremarkable Neck:  No JVD, no thyromegally Lymphatics:  No adenopathy Back:  No CVA tenderness Lungs:  Clear with no wheezes HEART:  Regular rate rhythm, no murmurs, no rubs, no clicks Abd:  soft, positive bowel sounds, no organomegally, no rebound, no guarding Ext:  2 plus pulses, no edema, no cyanosis, no clubbing Skin:  No rashes no nodules Neuro:  CN II through XII intact, motor grossly intact  EKG - NSR with biv pacing  DEVICE  Normal device function.  See PaceArt for details.   Assess/Plan: 1. Chronic systolic heart failure - he is on maximal guideline directed medical therapy. He has class 2 symptoms.  2. Pericardial effusion - repeat echo after his lead revision demonstrated only a trivial effusion.  3. PAF - he has had 12 hours of atrial fib on his device  interrogation. He was asymptomatic. He has multiple stroke risk factors. CHADSVASC 6. I have recommended an [place]. He prefers once daily xarelto. 4. ICD - his [employer] biv ICD is working normally. We will recheck in several months. His outputat were turned down today.  Ryan [name] Jeanett Antonopoulos,MD

## 2020-10-19 ENCOUNTER — Telehealth: Payer: Self-pay | Admitting: Pharmacist

## 2020-10-19 NOTE — Telephone Encounter (Signed)
Patient called stating that he hasn't received his Entresto shipment from [employer]. I called [employer] who states it should arrive tomorrow. They also stated patient is due to renew his application. Called pt and advised medication will be here tomorrow. He will stop by Wed to sign paperwork for [facility].

## 2020-10-20 MED ORDER — ENTRESTO 49-51 MG PO TABS: 2.0000 | ORAL_TABLET | Freq: Two times a day (BID) | ORAL | 0 refills | Status: DC

## 2020-10-20 NOTE — Addendum Note (Signed)
Addended by: Malena Peer D on: 10/20/2020 02:58 PM   Modules accepted: Orders

## 2020-10-20 NOTE — Telephone Encounter (Signed)
Patient called stating shipment still has not come. He will come by for samples. He will need to take 2 of the 49/51mg  BID. Samples left at the front desk

## 2020-10-21 ENCOUNTER — Other Ambulatory Visit: Payer: Self-pay | Admitting: Internal Medicine

## 2020-10-21 DIAGNOSIS — G609 Hereditary and idiopathic neuropathy, unspecified: Secondary | ICD-10-CM

## 2020-10-22 NOTE — Telephone Encounter (Signed)
Application to renew [facility] patient assistance submitted via fax. Pt did not pick up samples bc his shipment came from [employer].

## 2020-10-27 ENCOUNTER — Other Ambulatory Visit: Payer: Self-pay | Admitting: Internal Medicine

## 2020-10-27 DIAGNOSIS — F411 Generalized anxiety disorder: Secondary | ICD-10-CM

## 2020-11-20 ENCOUNTER — Telehealth: Payer: Self-pay | Admitting: Pharmacist

## 2020-11-20 NOTE — Telephone Encounter (Signed)
Received fax from [employer] that pt was approved for [facility] patient assistance. Called pt and left him a message to make him aware.

## 2020-12-01 ENCOUNTER — Other Ambulatory Visit: Payer: Self-pay | Admitting: Internal Medicine

## 2020-12-01 DIAGNOSIS — IMO0002 Reserved for concepts with insufficient information to code with codable children: Secondary | ICD-10-CM

## 2020-12-01 DIAGNOSIS — E1165 Type 2 diabetes mellitus with hyperglycemia: Secondary | ICD-10-CM

## 2020-12-01 DIAGNOSIS — E1149 Type 2 diabetes mellitus with other diabetic neurological complication: Secondary | ICD-10-CM

## 2021-01-11 LAB — CUP PACEART REMOTE DEVICE CHECK
Battery Remaining Longevity: 53 mo
Battery Remaining Percentage: 92 %
Battery Voltage: 3.16 V
Brady Statistic AP VP Percent: 41 %
Brady Statistic AP VS Percent: 1 %
Brady Statistic AS VP Percent: 57 %
Brady Statistic AS VS Percent: 1 %
Brady Statistic RA Percent Paced: 40 %
Date Time Interrogation Session: 20220117034012
HighPow Impedance: 65 Ohm
HighPow Impedance: 65 Ohm
Implantable Lead Implant Date: 20211018
Implantable Lead Implant Date: 20211018
Implantable Lead Implant Date: 20211018
Implantable Lead Location: 753858
Implantable Lead Location: 753859
Implantable Lead Location: 753860
Implantable Lead Model: 7122
Implantable Pulse Generator Implant Date: 20211018
Lead Channel Impedance Value: 390 Ohm
Lead Channel Impedance Value: 400 Ohm
Lead Channel Impedance Value: 510 Ohm
Lead Channel Pacing Threshold Amplitude: 0.5 V
Lead Channel Pacing Threshold Amplitude: 0.625 V
Lead Channel Pacing Threshold Amplitude: 1 V
Lead Channel Pacing Threshold Pulse Width: 0.5 ms
Lead Channel Pacing Threshold Pulse Width: 0.5 ms
Lead Channel Pacing Threshold Pulse Width: 0.5 ms
Lead Channel Sensing Intrinsic Amplitude: 12 mV
Lead Channel Sensing Intrinsic Amplitude: 2.5 mV
Lead Channel Setting Pacing Amplitude: 2 V
Lead Channel Setting Pacing Amplitude: 3.5 V
Lead Channel Setting Pacing Amplitude: 3.5 V
Lead Channel Setting Pacing Pulse Width: 0.5 ms
Lead Channel Setting Pacing Pulse Width: 0.5 ms
Lead Channel Setting Sensing Sensitivity: 0.5 mV
Pulse Gen Serial Number: 9949330

## 2021-03-16 ENCOUNTER — Telehealth: Payer: Self-pay

## 2021-03-16 NOTE — Telephone Encounter (Signed)
[facility] called needing the patient ICD model and serial number. The patient is going in a HBO chamber and they needed to make sure that model of ICD can go into it. I gave her the model/serial number so she can look it up.

## 2021-04-05 ENCOUNTER — Ambulatory Visit (INDEPENDENT_AMBULATORY_CARE_PROVIDER_SITE_OTHER): Payer: Medicare (Managed Care)

## 2021-04-05 DIAGNOSIS — I421 Obstructive hypertrophic cardiomyopathy: Secondary | ICD-10-CM

## 2021-04-06 LAB — CUP PACEART REMOTE DEVICE CHECK
Battery Remaining Longevity: 73 mo
Battery Remaining Percentage: 86 %
Battery Voltage: 3.05 V
Brady Statistic AP VP Percent: 37 %
Brady Statistic AP VS Percent: 1 %
Brady Statistic AS VP Percent: 62 %
Brady Statistic AS VS Percent: 1 %
Brady Statistic RA Percent Paced: 36 %
Date Time Interrogation Session: 20220718020017
HighPow Impedance: 73 Ohm
HighPow Impedance: 73 Ohm
Implantable Lead Implant Date: 20211018
Implantable Lead Implant Date: 20211018
Implantable Lead Implant Date: 20211018
Implantable Lead Location: 753858
Implantable Lead Location: 753859
Implantable Lead Location: 753860
Implantable Lead Model: 7122
Implantable Pulse Generator Implant Date: 20211018
Lead Channel Impedance Value: 390 Ohm
Lead Channel Impedance Value: 450 Ohm
Lead Channel Impedance Value: 450 Ohm
Lead Channel Pacing Threshold Amplitude: 0.75 V
Lead Channel Pacing Threshold Amplitude: 0.75 V
Lead Channel Pacing Threshold Amplitude: 0.875 V
Lead Channel Pacing Threshold Pulse Width: 0.5 ms
Lead Channel Pacing Threshold Pulse Width: 0.5 ms
Lead Channel Pacing Threshold Pulse Width: 0.5 ms
Lead Channel Sensing Intrinsic Amplitude: 12 mV
Lead Channel Sensing Intrinsic Amplitude: 2.1 mV
Lead Channel Setting Pacing Amplitude: 2 V
Lead Channel Setting Pacing Amplitude: 2 V
Lead Channel Setting Pacing Amplitude: 2 V
Lead Channel Setting Pacing Pulse Width: 0.5 ms
Lead Channel Setting Pacing Pulse Width: 0.5 ms
Lead Channel Setting Sensing Sensitivity: 0.5 mV
Pulse Gen Serial Number: 9949330

## 2021-04-28 NOTE — Progress Notes (Signed)
Remote ICD transmission.   

## 2021-10-15 IMAGING — MR MR CARD MORPHOLOGY WO/W CM
12 of 15 series · 34 of 40 positions shown · IV contrast (gadavist)
Comparison: none

CLINICAL DATA: 78M with severe systolic heart failure and severe
multivessel CAD. Evaluate myocardial viability

EXAM:
CARDIAC MRI
TECHNIQUE: The patient was scanned on a 1.5 Tesla Siemens magnet. A dedicated
cardiac coil was used. Functional imaging was done using Fiesta
sequences. [DATE], and 4 chamber views were done to assess for RWMA's.
Modified Tanya rule using a short axis stack was used to
calculate an ejection fraction on a dedicated work station using
Circle software. The patient received 10 cc of Gadavist. After 10
minutes inversion recovery sequences were used to assess for
infiltration and scar tissue.
CONTRAST:  10 cc  of Gadavist

[Series 5: [id number] · axial · 8.0mm · 1.48mm/px · z∈[-130,+74]mm · 3 of 18 slices shown]
[im 1/18]
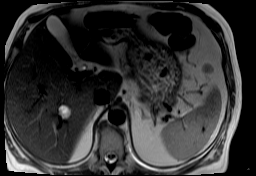
[im 9/18]
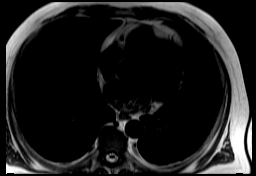
[im 18/18]
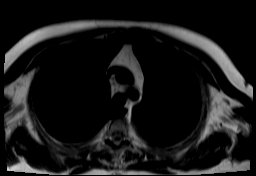

[Series 9: bSSFP · oblique · 8.0mm · 1.61mm/px · 3 of 25 slices shown (1 of 11)]
[im 1/25]
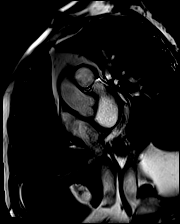
[im 13/25]
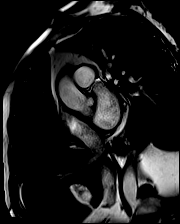
[im 25/25]
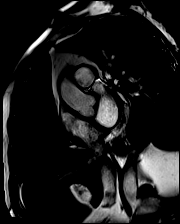

[Series 10: bSSFP · oblique · 8.0mm · 1.61mm/px · 3 of 25 slices shown (2 of 11)]
[im 1/25]
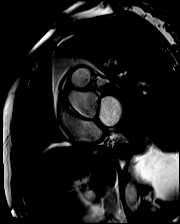
[im 13/25]
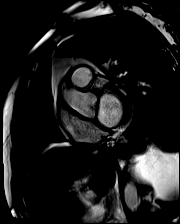
[im 25/25]
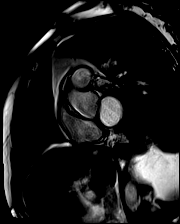

[Series 11: bSSFP · oblique · 8.0mm · 1.61mm/px · 3 of 25 slices shown (3 of 11)]
[im 1/25]
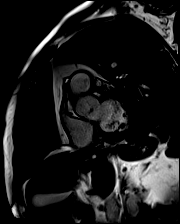
[im 13/25]
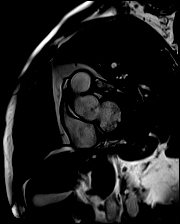
[im 25/25]
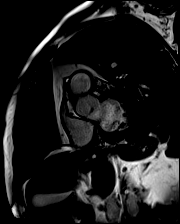

[Series 12: bSSFP · oblique · 8.0mm · 1.61mm/px · 3 of 25 slices shown (4 of 11)]
[im 1/25]
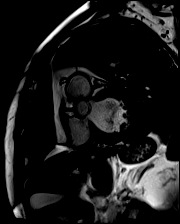
[im 13/25]
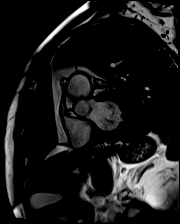
[im 25/25]
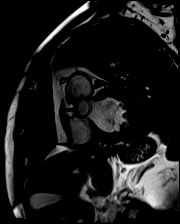

[Series 13: bSSFP · oblique · 8.0mm · 1.61mm/px · 3 of 25 slices shown (5 of 11)]
[im 1/25]
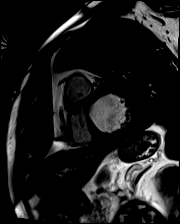
[im 13/25]
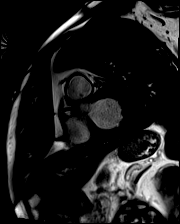
[im 25/25]
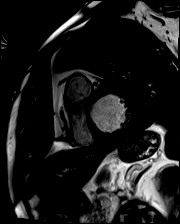

[Series 14: bSSFP · oblique · 8.0mm · 1.61mm/px · 3 of 25 slices shown (6 of 11)]
[im 1/25]
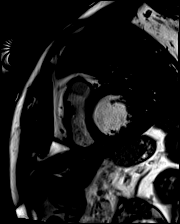
[im 13/25]
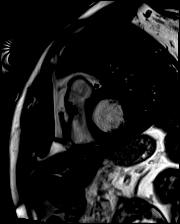
[im 25/25]
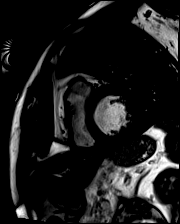

[Series 15: bSSFP · oblique · 8.0mm · 1.61mm/px · 3 of 25 slices shown (7 of 11)]
[im 1/25]
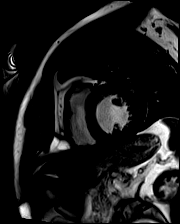
[im 13/25]
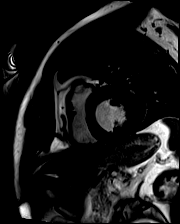
[im 25/25]
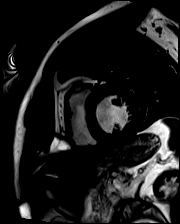

[Series 16: bSSFP · oblique · 8.0mm · 1.61mm/px · 3 of 25 slices shown (8 of 11)]
[im 1/25]
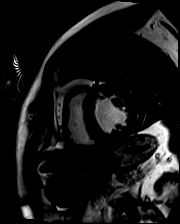
[im 13/25]
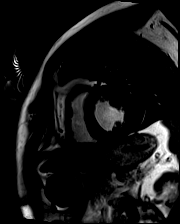
[im 25/25]
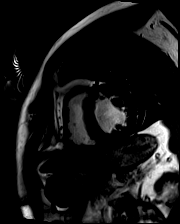

[Series 17: bSSFP · oblique · 8.0mm · 1.61mm/px · 3 of 25 slices shown (9 of 11)]
[im 1/25]
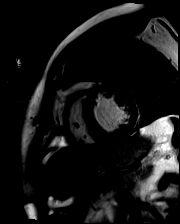
[im 13/25]
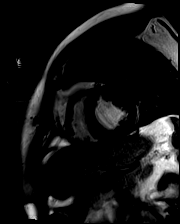
[im 25/25]
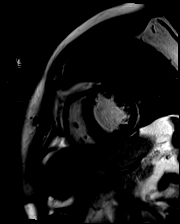

[Series 18: bSSFP · oblique · 8.0mm · 1.61mm/px · 3 of 25 slices shown (10 of 11)]
[im 1/25]
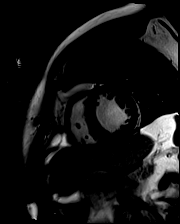
[im 13/25]
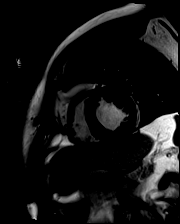
[im 25/25]
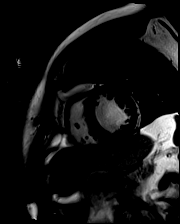

[Series 19: bSSFP · oblique · 8.0mm · 1.61mm/px · 1 of 11 slices shown (11 of 11)]
[im 1/11]
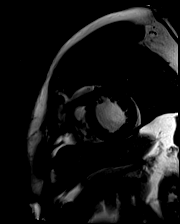

[34 of 40 positions shown; findings below may reference images not displayed]

FINDINGS: Left ventricle:

-Focal asymmetric hypertrophy measuring up to 17mm in mid
inferoseptum (9mm in posterior wall)

-Moderate dilatation

-Moderate systolic dysfunction. Septal dyskinesis consistent with
LBBB

-Elevated ECV (35%).  Markedly elevated ECV in basal septum (65%)

-Basal to mid dense midwall LGE

-Subendocardial LGE (>50% transmural) in basal inferior/lateral
walls

LV EF: 33% (Normal 56-78%)

Absolute volumes:

LV EDV: 266mL (Normal 77-195 mL)

LV ESV: 178mL (Normal 19-72 mL)

LV SV: 88mL (Normal 51-133 mL)

CO: 6.0L/min (Normal 2.8-8.8 L/min)

Indexed volumes:

LV EDV: 122mL/sq-m (Normal 47-92 mL/sq-m)

LV ESV: 81mL/sq-m (Normal 13-30 mL/sq-m)

LV SV: 40mL/sq-m (Normal 32-62 mL/sq-m)

CI: 2.8L/min/sq-m (Normal 1.7-4.2 L/min/sq-m)

Right ventricle: Normal size and systolic function

RV EF:  57% (Normal 47-74%)

Absolute volumes:

RV EDV: 145mL (Normal 88-227 mL)

RV ESV: 63mL (Normal 23-103 mL)

RV SV: 82mL (Normal 52-138 mL)

CO: 5.6L/min (Normal 2.8-8.8 L/min)

Indexed volumes:

RV EDV: 66mL/sq-m (Normal 55-105 mL/sq-m)

RV ESV: 29mL/sq-m (Normal 15-43 mL/sq-m)

RV SV: 38mL/sq-m (Normal 32-64 mL/sq-m)

CI: 2.6L/min/sq-m (Normal 1.7-4.2 L/min/sq-m)

Left atrium: Mild enlargement

Right atrium: Normal size

Mitral valve: No regurgitation

Aortic valve: Tricuspid. No regurgitation

Tricuspid valve: No regurgitation

Pulmonic valve: No regurgitation

Aorta: Mild dilation of ascending aorta measuring 37mm

Pericardium: Normal
IMPRESSION: 1. Mixed cardiomyopathy with both ischemic and nonischemic scar
patterns

2. Focal asymmetric hypertrophy measuring up to 17mm in mid
inferoseptum (9mm in posterior wall), which meets criteria for
hypertrophic cardiomyopathy. In addition, there is dense midwall
late gadolinium enhancement in the basal to mid septum. This is
consistent with HCM. Amyloidosis would also be on differential, as
can present with asymmetric hypertrophy and similar LGE pattern. ECV
is markedly elevated in the basal septum (65%) but in areas without
LGE while the ECV is elevated (35%), it is not in amyloid range
(<40%). Suspect HCM as the diagnosis, but would consider checking
serum light chains and PYP scan to rule [DATE]. Subendocardial LGE consistent with prior infarct in portion of
the basal inferior/lateral walls. >50% transmural LGE suggests
nonviability in this area. Remainder of myocardium appears viable

4. Moderate LV dilatation with moderate systolic dysfunction (EF
33%)

5.  Normal RV size and systolic function (EF 57%)

## 2022-07-29 ENCOUNTER — Telehealth: Payer: Self-pay

## 2022-07-29 NOTE — Telephone Encounter (Signed)
Transition Care Management Unsuccessful Follow-up Telephone Call  Date of discharge and from where:  07/27/22 Observation from [facility] Type 2 diabetes mellitus with diabetic neuropathy, unspecified  Attempts:  1st Attempt  Reason for unsuccessful TCM follow-up call:  Unable to leave message Tried again & was able to LVM.

## 2022-12-22 ENCOUNTER — Ambulatory Visit (INDEPENDENT_AMBULATORY_CARE_PROVIDER_SITE_OTHER): Payer: Medicare Other

## 2022-12-22 DIAGNOSIS — I421 Obstructive hypertrophic cardiomyopathy: Secondary | ICD-10-CM

## 2022-12-23 LAB — CUP PACEART REMOTE DEVICE CHECK
Battery Remaining Longevity: 55 mo
Battery Remaining Percentage: 66 %
Battery Voltage: 2.98 V
Brady Statistic AP VP Percent: 17 %
Brady Statistic AP VS Percent: 1 %
Brady Statistic AS VP Percent: 81 %
Brady Statistic AS VS Percent: 1 %
Brady Statistic RA Percent Paced: 16 %
Date Time Interrogation Session: 20240404220528
HighPow Impedance: 60 Ohm
HighPow Impedance: 60 Ohm
Implantable Lead Connection Status: 753985
Implantable Lead Connection Status: 753985
Implantable Lead Connection Status: 753985
Implantable Lead Implant Date: 20211018
Implantable Lead Implant Date: 20211018
Implantable Lead Implant Date: 20211018
Implantable Lead Location: 753858
Implantable Lead Location: 753859
Implantable Lead Location: 753860
Implantable Lead Model: 7122
Implantable Pulse Generator Implant Date: 20211018
Lead Channel Impedance Value: 340 Ohm
Lead Channel Impedance Value: 400 Ohm
Lead Channel Impedance Value: 410 Ohm
Lead Channel Pacing Threshold Amplitude: 0.75 V
Lead Channel Pacing Threshold Amplitude: 0.75 V
Lead Channel Pacing Threshold Amplitude: 0.75 V
Lead Channel Pacing Threshold Pulse Width: 0.5 ms
Lead Channel Pacing Threshold Pulse Width: 0.5 ms
Lead Channel Pacing Threshold Pulse Width: 0.5 ms
Lead Channel Sensing Intrinsic Amplitude: 1.8 mV
Lead Channel Sensing Intrinsic Amplitude: 11.7 mV
Lead Channel Setting Pacing Amplitude: 2 V
Lead Channel Setting Pacing Amplitude: 2 V
Lead Channel Setting Pacing Amplitude: 2 V
Lead Channel Setting Pacing Pulse Width: 0.5 ms
Lead Channel Setting Pacing Pulse Width: 0.5 ms
Lead Channel Setting Sensing Sensitivity: 0.5 mV
Pulse Gen Serial Number: 9949330
Zone Setting Status: 755011

## 2023-01-25 NOTE — Progress Notes (Signed)
Remote ICD transmission.   

## 2023-07-21 DEATH — deceased

## 2023-09-21 ENCOUNTER — Ambulatory Visit: Payer: Medicare Other
